# Patient Record
Sex: Female | Born: 1945 | Race: Black or African American | Hispanic: No | Smoking: Former smoker
Health system: Southern US, Community
[De-identification: ages and names within clinical notes are randomized; demographics above are authoritative.]

## PROBLEM LIST (undated history)

## (undated) DIAGNOSIS — I890 Lymphedema, not elsewhere classified: Secondary | ICD-10-CM

## (undated) DIAGNOSIS — Z860100 Personal history of colon polyps, unspecified: Secondary | ICD-10-CM

## (undated) DIAGNOSIS — L8990 Pressure ulcer of unspecified site, unspecified stage: Secondary | ICD-10-CM

## (undated) DIAGNOSIS — M4800 Spinal stenosis, site unspecified: Secondary | ICD-10-CM

## (undated) DIAGNOSIS — E785 Hyperlipidemia, unspecified: Secondary | ICD-10-CM

## (undated) DIAGNOSIS — K589 Irritable bowel syndrome without diarrhea: Secondary | ICD-10-CM

## (undated) HISTORY — DX: Spinal stenosis, site unspecified: M48.00

## (undated) HISTORY — DX: Personal history of colon polyps, unspecified: Z86.0100

## (undated) HISTORY — PX: PARTIAL HYSTERECTOMY: SHX80

## (undated) HISTORY — PX: OTHER SURGICAL HISTORY: SHX169

## (undated) HISTORY — DX: Hyperlipidemia, unspecified: E78.5

## (undated) HISTORY — PX: VESICOVAGINAL FISTULA CLOSURE W/ TAH: SUR271

## (undated) HISTORY — DX: Personal history of colonic polyps: Z86.010

## (undated) HISTORY — DX: Irritable bowel syndrome, unspecified: K58.9

---

## 2000-12-02 ENCOUNTER — Encounter: Payer: Self-pay | Admitting: Internal Medicine

## 2004-10-06 ENCOUNTER — Ambulatory Visit: Payer: Self-pay | Admitting: Internal Medicine

## 2005-02-02 ENCOUNTER — Ambulatory Visit: Payer: Self-pay | Admitting: Internal Medicine

## 2005-03-01 ENCOUNTER — Ambulatory Visit: Payer: Self-pay | Admitting: Internal Medicine

## 2005-03-22 ENCOUNTER — Ambulatory Visit: Payer: Self-pay | Admitting: Internal Medicine

## 2005-03-30 ENCOUNTER — Ambulatory Visit: Payer: Self-pay | Admitting: Internal Medicine

## 2005-06-08 ENCOUNTER — Ambulatory Visit: Payer: Self-pay | Admitting: Internal Medicine

## 2005-12-14 ENCOUNTER — Ambulatory Visit: Payer: Self-pay | Admitting: Internal Medicine

## 2006-06-14 ENCOUNTER — Ambulatory Visit: Payer: Self-pay | Admitting: Internal Medicine

## 2006-07-08 ENCOUNTER — Ambulatory Visit: Payer: Self-pay | Admitting: Internal Medicine

## 2006-07-12 ENCOUNTER — Encounter: Payer: Self-pay | Admitting: Internal Medicine

## 2006-08-23 ENCOUNTER — Ambulatory Visit: Payer: Self-pay | Admitting: Internal Medicine

## 2006-12-13 ENCOUNTER — Ambulatory Visit: Payer: Self-pay | Admitting: Internal Medicine

## 2007-06-10 DIAGNOSIS — G589 Mononeuropathy, unspecified: Secondary | ICD-10-CM | POA: Insufficient documentation

## 2007-06-10 DIAGNOSIS — E785 Hyperlipidemia, unspecified: Secondary | ICD-10-CM

## 2007-06-10 DIAGNOSIS — M479 Spondylosis, unspecified: Secondary | ICD-10-CM | POA: Insufficient documentation

## 2007-06-10 DIAGNOSIS — M4800 Spinal stenosis, site unspecified: Secondary | ICD-10-CM

## 2007-06-10 DIAGNOSIS — I10 Essential (primary) hypertension: Secondary | ICD-10-CM | POA: Insufficient documentation

## 2007-06-10 DIAGNOSIS — K589 Irritable bowel syndrome without diarrhea: Secondary | ICD-10-CM

## 2007-06-13 ENCOUNTER — Ambulatory Visit: Payer: Self-pay | Admitting: Internal Medicine

## 2007-06-16 LAB — CONVERTED CEMR LAB
Albumin: 3.7 g/dL (ref 3.5–5.2)
Calcium: 9.2 mg/dL (ref 8.4–10.5)
Cholesterol: 233 mg/dL (ref 0–200)
Creatinine, Ser: 1.3 mg/dL — ABNORMAL HIGH (ref 0.4–1.2)
Direct LDL: 156.9 mg/dL
Glucose, Bld: 91 mg/dL (ref 70–99)
HDL: 53 mg/dL (ref >39.0)
Potassium: 4.1 meq/L (ref 3.5–5.1)
Triglycerides: 106 mg/dL (ref 0–149)

## 2007-11-10 ENCOUNTER — Telehealth (INDEPENDENT_AMBULATORY_CARE_PROVIDER_SITE_OTHER): Payer: Self-pay | Admitting: *Deleted

## 2007-12-12 ENCOUNTER — Ambulatory Visit: Payer: Self-pay | Admitting: Internal Medicine

## 2008-02-06 ENCOUNTER — Encounter: Payer: Self-pay | Admitting: Internal Medicine

## 2008-02-06 ENCOUNTER — Ambulatory Visit: Payer: Self-pay | Admitting: Internal Medicine

## 2008-02-09 ENCOUNTER — Encounter (INDEPENDENT_AMBULATORY_CARE_PROVIDER_SITE_OTHER): Payer: Self-pay | Admitting: *Deleted

## 2008-05-07 ENCOUNTER — Telehealth: Payer: Self-pay | Admitting: Internal Medicine

## 2008-06-07 ENCOUNTER — Telehealth: Payer: Self-pay | Admitting: Internal Medicine

## 2008-06-07 ENCOUNTER — Ambulatory Visit: Payer: Self-pay | Admitting: Internal Medicine

## 2008-06-07 DIAGNOSIS — R609 Edema, unspecified: Secondary | ICD-10-CM

## 2008-06-09 ENCOUNTER — Telehealth: Payer: Self-pay | Admitting: Internal Medicine

## 2008-06-09 ENCOUNTER — Encounter: Payer: Self-pay | Admitting: Internal Medicine

## 2008-06-14 ENCOUNTER — Telehealth (INDEPENDENT_AMBULATORY_CARE_PROVIDER_SITE_OTHER): Payer: Self-pay | Admitting: *Deleted

## 2008-06-18 ENCOUNTER — Encounter: Payer: Self-pay | Admitting: Internal Medicine

## 2008-06-29 ENCOUNTER — Encounter: Payer: Self-pay | Admitting: Internal Medicine

## 2008-07-01 ENCOUNTER — Ambulatory Visit: Payer: Self-pay | Admitting: Specialist

## 2008-07-08 ENCOUNTER — Encounter: Payer: Self-pay | Admitting: Internal Medicine

## 2008-07-23 ENCOUNTER — Encounter: Payer: Self-pay | Admitting: Internal Medicine

## 2008-07-28 ENCOUNTER — Telehealth: Payer: Self-pay | Admitting: Internal Medicine

## 2008-08-03 ENCOUNTER — Ambulatory Visit: Payer: Self-pay | Admitting: Internal Medicine

## 2008-08-04 ENCOUNTER — Ambulatory Visit: Payer: Self-pay | Admitting: Cardiovascular Disease

## 2008-08-09 ENCOUNTER — Telehealth: Payer: Self-pay | Admitting: Internal Medicine

## 2008-08-19 ENCOUNTER — Telehealth: Payer: Self-pay | Admitting: Internal Medicine

## 2008-08-24 ENCOUNTER — Ambulatory Visit: Payer: Self-pay | Admitting: Internal Medicine

## 2008-08-24 LAB — CONVERTED CEMR LAB
Albumin: 3.7 g/dL (ref 3.5–5.2)
BUN: 15 mg/dL (ref 6–23)
CO2: 32 meq/L (ref 19–32)
Calcium: 9 mg/dL (ref 8.4–10.5)
Creatinine, Ser: 1.2 mg/dL (ref 0.4–1.2)
GFR calc non Af Amer: 49 mL/min
Phosphorus: 3.4 mg/dL (ref 2.3–4.6)
Sodium: 141 meq/L (ref 135–145)

## 2008-08-27 ENCOUNTER — Ambulatory Visit: Payer: Self-pay | Admitting: Cardiology

## 2008-08-30 DIAGNOSIS — D4959 Neoplasm of unspecified behavior of other genitourinary organ: Secondary | ICD-10-CM | POA: Insufficient documentation

## 2008-09-02 ENCOUNTER — Encounter: Payer: Self-pay | Admitting: Internal Medicine

## 2008-09-07 ENCOUNTER — Ambulatory Visit: Payer: Self-pay | Admitting: Internal Medicine

## 2008-09-23 ENCOUNTER — Ambulatory Visit: Payer: Self-pay | Admitting: Urology

## 2008-10-06 ENCOUNTER — Ambulatory Visit: Payer: Self-pay | Admitting: Urology

## 2008-10-06 ENCOUNTER — Encounter: Payer: Self-pay | Admitting: Internal Medicine

## 2008-10-12 ENCOUNTER — Encounter: Payer: Self-pay | Admitting: Internal Medicine

## 2008-10-12 ENCOUNTER — Ambulatory Visit: Payer: Self-pay | Admitting: Urology

## 2008-10-18 ENCOUNTER — Telehealth (INDEPENDENT_AMBULATORY_CARE_PROVIDER_SITE_OTHER): Payer: Self-pay | Admitting: *Deleted

## 2008-10-28 ENCOUNTER — Telehealth: Payer: Self-pay | Admitting: Internal Medicine

## 2008-11-18 ENCOUNTER — Telehealth: Payer: Self-pay | Admitting: Internal Medicine

## 2009-06-17 ENCOUNTER — Ambulatory Visit: Payer: Self-pay | Admitting: Internal Medicine

## 2009-06-17 DIAGNOSIS — M25579 Pain in unspecified ankle and joints of unspecified foot: Secondary | ICD-10-CM | POA: Insufficient documentation

## 2009-06-20 LAB — CONVERTED CEMR LAB
Alkaline Phosphatase: 76 units/L (ref 39–117)
Basophils Absolute: 0 10*3/uL (ref 0.0–0.1)
Bilirubin, Direct: 0.2 mg/dL (ref 0.0–0.3)
CO2: 30 meq/L (ref 19–32)
Calcium: 9.5 mg/dL (ref 8.4–10.5)
Eosinophils Relative: 2 % (ref 0.0–5.0)
HCT: 38.2 % (ref 36.0–46.0)
Lymphs Abs: 1.1 10*3/uL (ref 0.7–4.0)
MCHC: 33.8 g/dL (ref 30.0–36.0)
MCV: 93.9 fL (ref 78.0–100.0)
Monocytes Relative: 9.6 % (ref 3.0–12.0)
Neutro Abs: 3 10*3/uL (ref 1.4–7.7)
Neutrophils Relative %: 63.4 % (ref 43.0–77.0)
Platelets: 171 10*3/uL (ref 150.0–400.0)
RBC: 4.07 M/uL (ref 3.87–5.11)
TSH: 1.03 microintl units/mL (ref 0.35–5.50)
Total Bilirubin: 1 mg/dL (ref 0.3–1.2)
Triglycerides: 96 mg/dL (ref 0.0–149.0)
Uric Acid, Serum: 6.7 mg/dL (ref 2.4–7.0)
VLDL: 19.2 mg/dL (ref 0.0–40.0)
WBC: 4.7 10*3/uL (ref 4.5–10.5)

## 2009-07-05 ENCOUNTER — Telehealth: Payer: Self-pay | Admitting: Internal Medicine

## 2011-04-03 NOTE — Letter (Signed)
August 04, 2008    [name], MD  [address] [place], [place] [address]   RE:  Kayla Long, Kayla Long  MRN:  [id number]  /  DOB:  [birth year]   Dear Dr. Alphonsus Sias:   I had the pleasure today of seeing Kayla Long in my cardiology  office here in [place].  As you recall, she is a very pleasant  morbidly obese 65 year old [place] American female with a past medical  history significant for hypertension, hyperlipidemia, irritable bowel  syndrome, spinal stenosis, and colon polyps who has had complaints over  the last 4-5 months of a very swollen right leg with pain over the knee,  calf, and foot in the right leg.  She has also noted skin discoloration  changes over the right lower extremity.  She has no complaints of  swelling or pain in the left lower extremity.  She has been seen by  multiple specialists in the last several months including one of the  vascular specialists at [facility].  As you know, lower  extremity venous studies revealed no evidence of DVT and no superficial  thrombophlebitis.  There was also no evidence of venous reflux in the  lower extremities.  Ankle brachial indices were normal in the right and  left lower extremity.  This patient has also been seen by a  neurosurgical specialist and an orthopedic surgery specialist for the  pain in her leg.  The neurosurgical consultation felt that the stenosis  at L4-L5 could be one of the reasons that she is having pain in her  right lower extremity.  This does not, however, explain the extreme  unilateral lower extremity swelling.   The patient tells me today that she has had no chest discomfort,  shortness of breath, palpitations, dizziness, near-syncope, syncope,  orthopnea, or paroxysmal nocturnal dyspnea.  Her only complaint fact has  been the very painful swollen right lower extremity.   PAST MEDICAL HISTORY:  1. Hyperlipidemia.  2. Hypertension.  3. Irritable bowel  syndrome.  4. Morbid obesity.  5. Spinal stenosis.  6. Colon polyps.  7. Swollen right lower extremity of unknown etiology.   PAST SURGICAL HISTORY:  Hysterectomy and 2 vaginal deliveries.   ALLERGIES:  No known drug allergies.   MEDICATIONS:  1. Vicodin 5/500 mg three times daily.  2. Lexapro 20 mg half-a-tab daily.  3. Triamterene/hydrochlorothiazide 37.5/25 mg once daily.  4. Furosemide 80 mg once daily.   SOCIAL HISTORY:  The patient is divorced and has 2 adult children.  She  admits to the remote use of tobacco but none recently.  She denies the  use of alcohol or illicit drugs.   FAMILY HISTORY:  Noncontributory.   REVIEW OF SYSTEMS:  As stated in the history of present illness is  otherwise negative.   PHYSICAL EXAMINATION:  GENERAL:  She is a pleasant, morbidly obese  African American female in no acute distress sitting in a wheelchair  during the examination.  VITAL SIGNS:  Blood pressure 120/70, pulse 68 and regular, respirations  12 and nonlabored.  NECK:  No JVD visualized.  No carotid bruits.  No lymphadenopathy.  No  thyromegaly.  SKIN:  Warm and dry.  HEENT:  Oropharynx, mucous membranes are moist.  LUNGS:  Clear to auscultation bilaterally without wheezes, rhonchi, or  crackles noted.  CARDIOVASCULAR:  Regular rate and rhythm without murmurs, gallops, or  rubs noted.  ABDOMEN:  Obese.  Bowel sounds present.  EXTREMITIES:  The right lower extremity has 2+ edema extending from the  foot up to the thigh.  There are skin changes that are consistent with  stasis dermatitis.  I do not appreciate any focal areas of erythema.  It  is difficult to assess the patient's pulses secondary to her degree of  edema.  Her femoral pulses are very difficult to palpate secondary to  her body habitus.  Her left leg has trace edema.  Both of her feet are  warm and there are no open ulcerations on my examination.  I also do not  appreciate any significant varicose veins.  The  patient's sensation is  intact over her right and left foot.   DIAGNOSTIC STUDIES:  1. Doppler ultrasound of the right lower extremity performed on June 07, 2008 at [facility] is negative for DVT.      However, there is notation on this study that this is an incomplete      exam because of the patient's body habitus and lower extremity      edema.  The examination was difficult and the vessels could not all      be visualize with compression.  2. A 12-lead electrocardiogram obtained in our office today shows      normal sinus rhythm with a ventricular rate of 68 beats per minute      and no other abnormalities.  3. MRI of the lumbar spine without contrast performed on July 01, 2008 at [facility] shows multilevel degenerative disk      disease which is most severe at the L4/L5 level.  There is severe      thecal sac stenosis and bilateral exiting nerve root compression.      There is also paracentral disk protrusion at L5/S1 with      encroachment upon the central exiting nerve root with possible mild      compression and compromise.   ASSESSMENT/PLAN:  This is a very pleasant 65 year old African American  female with a history of hypertension, hyperlipidemia, and morbid  obesity who presents today with unilateral lower extremity swelling of  unknown etiology.  The patient has undergone multiple tests for this  problem and has seen multiple specialists.  I do not think that the  unilateral lower extremity swelling is related to any cardiac problems.  The patient's physical examination is not suggestive of any cardiac  abnormalities.  She also has no symptoms to suggest angina, arrhythmias,  or congestive heart failure.  Her EKG in our office today is normal.  I  do think that her unilateral lower extremity swelling is very concerning  for a deep venous thrombosis.  This has been considered multiple times  in the past and I am concerned that the  venous Doppler studies were  inadequate secondary to the patient's size.  The report I have today  does state that this was not a complete study secondary to her size.  The differential diagnosis for her unilateral lower extremity swelling  also includes a ruptured Baker's cyst behind the knee; however, my  physical examination today does not suggest this.  I do not think that  the patient has an active compartment syndrome as this has been going on  for some time.  I am concerned that it could possibly be some  obstructive mass preventing the venous return of blood from her right  leg.  I think the most  appropriate next test in her workup should be a  CT of her abdomen and pelvis to rule out tumor or a lymphoma.  Another  condition that can cause unilateral lower extremity swelling is May-  Thurner syndrome.  As you know this is a compression of the iliac vein  by the iliac artery preventing venous return.  This is typically found  in left lower extremity swelling.  The differential also includes  chronic lymphedema.   I would recommend proceeding with CT scan of abdomen and pelvis to rule  out an obstructive lesion preventing venous return.  You could consider  treating her as a DVT in the meantime while there is no definite  diagnosis.  I am still concerned that there could still be some DVT that  cannot be isolated on the Doppler examination because of the patient's  size.  I think that the  patient probably could benefit from seeing a dedicated venous vascular  specialist at one of the local [facility].  I do appreciate  this very interesting consult and will be glad to assist in any way  possible.  I have instructed the patient that we will order no further  cardiac workup at this time.    Sincerely,      Verne Carrow, MD  Electronically Signed    CM/MedQ  DD: 08/04/2008  DT: 08/05/2008  Job #: [id number]

## 2011-05-09 ENCOUNTER — Encounter: Payer: Self-pay | Admitting: Cardiovascular Disease

## 2012-04-18 ENCOUNTER — Encounter: Payer: Self-pay | Admitting: Internal Medicine

## 2015-05-11 ENCOUNTER — Ambulatory Visit: Payer: Medicare Other | Attending: Internal Medicine | Admitting: Occupational Therapy

## 2015-05-11 VITALS — Ht 67.0 in | Wt 315.0 lb

## 2015-05-11 DIAGNOSIS — I890 Lymphedema, not elsewhere classified: Secondary | ICD-10-CM | POA: Insufficient documentation

## 2015-05-11 DIAGNOSIS — E669 Obesity, unspecified: Secondary | ICD-10-CM | POA: Diagnosis not present

## 2015-05-11 NOTE — Patient Instructions (Signed)
1. Call Doctor immediately to report signs / symptoms of cellulitis. 2. Elevate legs to level of hear when seated. 3. Bring all existing compression garmens , devices and wraps to next visit.

## 2015-05-11 NOTE — Therapy (Signed)
[place] MAIN [facility] SERVICES [address] [place], [place], [address] Phone: [phone]   Fax:  [phone]  Occupational Therapy Evaluation  Patient Details  Name: Kayla Long MRN: [id number] Date of Birth: January 08, 1946 Referring Provider:  Hedy Jacob  Encounter Date: 05/11/2015      OT End of Session - 05/11/15 1706    Visit Number 1   Number of Visits 36   Date for OT Re-Evaluation 08/09/15   OT Start Time 1300   OT Stop Time 1422   OT Time Calculation (min) 82 min   Activity Tolerance Patient tolerated treatment well   Behavior During Therapy Flat affect      Past Medical History  Diagnosis Date  . Hx of colonic polyps     tubulovillous adenoma  . Hyperlipidemia   . IBS (irritable bowel syndrome)   . Spinal stenosis     with secondary neuropathy    Past Surgical History  Procedure Laterality Date  . Vesicovaginal fistula closure w/ tah    . 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 Pt reports leg swelling statrted without known precipitating events steadily worsened over the years. Eventually body assymetry led to loss of balance and associated falls which led to loss of employment. Limb swelling and body habitus severly limits patients ability to perform basic and instrumental  ADL and limits functional  mobility and ambulation,  Pt t reports she was seen here at [facility] in the past by Garlon Hatchet , OT, with some success. Pt states she understands lymphedema care program and is eager to get started. She tells me she has worn compression garments and  a night time device in the past, but existing garments/ devices no longer fit.   Patient is accompained by: Family member   Pertinent History BLE Lmphedema, R>L, BLE weakness, lumbar pain, CVI, Obesity, OA, HTN, Hx Depression, Partial hysterectomy 1997, former smoker, uses RW at single story home, has tub transfer bench. Pt's adult daughters assist most days a week. Pt has "room mate"   Limitations BLE weakness, chronic back pain, difficulty walking, difficulty fitting LB clothing and street shoes, Unable to reach feet for LB bathing and skin care, severly limited standing tolerance, falls risk, difficulty w/ all transfers and bed mobility, depression 2/2 limited social, family and community participation 2/2 difficulty walking   Patient Stated Goals "get my legs better so I can go to the grocery store and be able to sit outside with my grandchildren"   Currently in Pain? Yes   Pain Score 8    Pain Location Heel   Pain Orientation Right   Pain Descriptors / Indicators Aching;Burning;Constant   Pain Type Chronic pain   Pain Onset More than a month ago   Pain Frequency Constant   Aggravating Factors  standing , walking, dependent positioning   Pain Relieving Factors elevation, compression   Effect of Pain on Daily Activities limits functional mobility, ambulatoion, and B/I ADLs, as well as social and community participaton   Multiple  Pain Sites Yes   Pain Score 0           [facility] OT Assessment - 05/11/15 0001    Assessment   Diagnosis Moderate, stage 3 RLE lymphedema, other and mild stage 3 LLE LE, 2/2 obesity, CVI    Onset Date 05/11/15   Prior Therapy yes, Intensive Phase Complete Decongestive Therapy (CDT) at [facility]   Precautions   Precautions --  LE precautions- hx cellulitis, Hx pneumonia   ADL   Lower Body Bathing Maximal assistance   Lower Body Dressing Maximal assistance   Tub/Shower Transfer Maximal assistance   Tub/Shower Transfer Equipment Transfer tub bench    Transfers/Ambulation Related to ADL's Max A x 1 for car transfers   IADL   Shopping Completely unable to shop   [employer] Does not participate in any housekeeping tasks   Meal Prep Needs to have meals prepared and served   [employer] on family or friends for transportation   Mobility   Mobility Status History of falls;Needs assist          LYMPHEDEMA/ONCOLOGY QUESTIONNAIRE - 05/11/15 7846    What other symptoms do you have   Are you Having Heaviness or Tightness Yes   Are you having pitting edema No   Is it Hard or Difficult finding clothes that fit Yes   Do you have infections Yes   Comments  Hx hospitalization for cellulitis   Stemmer Sign Yes   Lymphedema Stage   Stage STAGE 3 ELEPHANTIASIS   Lymphedema Assessments   Lymphedema Assessments Lower extremities   Right Lower Extremity Lymphedema   Other 25,435.89 ml3   Other limb volume differential + 26.81%, R>L   Left Lower Extremity Lymphedema   Other 18616.59 ml3                       OT Education - 05/11/15 1704    Education provided Yes   Education Details Lymphedema etiology, precautions, progression patterns; Complete Decongestive Therepay, (CDT) treatment proticols including Intensive and management phases; importance of ongoing daily LE self care to sustain clinical and functional gains   Person(s) Educated Patient;Child(ren)   Methods Explanation;Demonstration;Tactile cues;Verbal cues;Handout   Comprehension Verbalized understanding;Need further instruction;Other (comment)             OT Long Term Goals - 05/11/15 1717    OT LONG TERM GOAL #1   Title Caregivers independent with proper gradient application techniques by end of week 2 to ensure optimal volumetric reductions during Intensive Phase CDT   Baseline Dependent   Time 2   Period Weeks   Status New   OT LONG TERM GOAL #2   Title Pt t o tolerate thigh length compression wraps to RLE by end of week 2 to  ensure uptimale volumetric reduction throughout Intensive CDT.   Baseline Dependent   Time 2   Period Weeks   Status New   OT LONG TERM GOAL #3   Title Pt to achieve >/= 20% RLE limb volume reduction by end of intensive to improve functional  ADLs performance, ambulation and transfers, and to limit progression of chronic LE.   Baseline Dependent   Time 12   Period Weeks   OT LONG TERM GOAL #4   Title Caregivers independent with simple self MLD by end of intensive to ensure optimal retention of clinical gains and limit LE progression.   Baseline Dependent   Time 12   Period Weeks   Status  New   OT LONG TERM GOAL #5   Title Pt to remain infection free to limit LE progression   Baseline Dependent   Time 6   Period Months   Status On-going               Plan - 05/15/15 1708    Pt will benefit from skilled therapeutic intervention in order to improve on the following deficits (Retired) Decreased balance;Decreased knowledge of precautions;Decreased knowledge of use of DME;Decreased mobility;Decreased range of motion;Decreased skin integrity;Difficulty walking;Increased edema;Pain   Rehab Potential Fair   Clinical Impairments Affecting Rehab Potential chronic back and R heel pain, ( neuropathic in foot??) obesity, decreased LE strength and functional mobility, difficulty walking, unable to reach feet, longstanding and advanced, irreversible  LE w/ tissue changes including induration and severe fibrosis below knees,    OT Frequency 3x / week   OT Duration 12 weeks   OT Treatment/Interventions Self-care/ADL training;Compression bandaging;DME and/or AE instruction;Patient/family education;Other (comment);Therapeutic exercise;Manual Therapy;Manual lymph drainage;Therapeutic activities   Plan Stress importance of Ongoing impecable skin care at least 2 x daily w/ low pH Eucerin cream. Initial visits w emphasis on caregiver edu for proper compression wrapping techniques. Commence CDT to RLE  first 2/2 falls risk.    OT Home Exercise Plan 2/x daily BLE lymphatic pumping therex- 10 reps each in sequence   Recommended Other Services garments and HOS devices. Most likey ccl 3 flat knit Jobst Elvarex knee highs and Maceo Pro AD for [employer] and Agree with Plan of Care Patient;Family member/caregiver   Family Member Consulted daughters           G-Codes - 05/15/2015 1724    Functional Assessment Tool Used clinical oservation/ judgement, interview, physical palpation, interview, comparative limb vloumetrics   Functional Limitation Self care   Self Care Current Status [phone]) At least 80 percent but less than 100 percent impaired, limited or restricted   Self Care Goal Status ([id number]) At least 60 percent but less than 80 percent impaired, limited or restricted      Problem List Patient Active Problem List   Diagnosis Date Noted  . ANKLE PAIN, LEFT 06/17/2009  . OBESITY-MORBID (>100') 04/25/2009  . NEOPLASM UNSPEC NATURE OTH GENITOURINARY ORGANS 08/30/2008  . LEG EDEMA 06/07/2008  . HYPERLIPIDEMIA 06/10/2007  . NEUROPATHY 06/10/2007  . HYPERTENSION 06/10/2007  . IBS 06/10/2007  . ARTHRITIS, SPINE 06/10/2007  . SPINAL STENOSIS 06/10/2007    Andrey Spearman, MS, OTR/L, CLT-LANA 15-May-2015 5:32 PM   May 15, 2015, 5:32 PM  [place] MAIN [facility] SERVICES [address] [place], [place], [address] Phone: [phone]   Fax:  [phone]

## 2015-07-11 ENCOUNTER — Ambulatory Visit: Payer: Medicare Other | Admitting: Occupational Therapy

## 2015-07-13 ENCOUNTER — Ambulatory Visit: Payer: Medicare Other | Attending: Internal Medicine | Admitting: Occupational Therapy

## 2015-07-13 DIAGNOSIS — I890 Lymphedema, not elsewhere classified: Secondary | ICD-10-CM | POA: Diagnosis not present

## 2015-07-13 NOTE — Patient Instructions (Signed)

## 2015-07-13 NOTE — Therapy (Signed)
[place] MAIN [facility] SERVICES [address] [place], [place], [address] Phone: [phone]   Fax:  [phone]  Occupational Therapy Treatment  Patient Details  Name: Kayla Long MRN: [id number] Date of Birth: [birth year] Referring Provider:  Hedy Jacob  Encounter Date: 07/13/2015      OT End of Session - 07/13/15 1712    Visit Number 2   Number of Visits 36   Date for OT Re-Evaluation 08/09/15   OT Start Time 1535   OT Stop Time 1640   OT Time Calculation (min) 65 min   Equipment Utilized During Treatment compression wraps and supplies   Activity Tolerance Patient tolerated treatment well;Other (comment)   Behavior During Therapy Flat affect      Past Medical History  Diagnosis Date  . Hx of colonic polyps     tubulovillous adenoma  . Hyperlipidemia   . IBS (irritable bowel syndrome)   . Spinal stenosis     with secondary neuropathy    Past Surgical History  Procedure Laterality Date  . Vesicovaginal fistula closure w/ tah    . Vaginal deliveries      x2    There were no vitals filed for this visit.  Visit Diagnosis:  Lymphedema      Subjective Assessment - 07/13/15 1703    Subjective  Pt presents with moderate-sever, Stage III, (Elephantiasis) RLE lymphedema (LE) and mild-moderate stage III LLE LE 2/2 CVI and obesity with ideopathic onset in late 1990's. Pt is accompanied on first Rx visit by her daughter [name]. Pt arrived in a wheelchair and required Mod A x 1 to stand pivot to treatment table <-> transport chair.  Finctional bed mobility is severly limited.   Patient is accompained by: Family member   Pertinent History BLE Lmphedema, R>L, BLE weakness, lumbar pain, CVI, Obesity, OA, HTN, Hx Depression, Partial hysterectomy 1997, former smoker, uses RW at single story home, has tub transfer bench. Pt's adult daughters assist most days a week. Pt has "room mate"   Limitations BLE weakness, chronic back pain,  difficulty walking, difficulty fitting LB clothing and street shoes, Unable to reach feet for LB bathing and skin care, severly limited standing tolerance, falls risk, difficulty w/ all transfers and bed mobility, depression 2/2 limited social, family and community participation 2/2 difficulty walking   Patient Stated Goals "get my legs better so I can go to the grocery store and be able to sit outside with my grandchildren"   Currently in Pain? No/denies   Pain Onset More than a month ago                      OT Treatments/Exercises (OP) - 07/13/15 0001    ADLs   ADL Comments Se Pt EDU   ADL Education Given Yes   Manual Therapy   Manual Therapy Edema management;Compression Bandaging   Manual therapy comments skin care   Edema Management edema management severly limited by decreased functional mobility   Compression Bandaging LLE gradient compression wraps applied circumferentially as follows: toe wrap x1 under cotton stockinett; 8 cm x 1 to foot and ankle, + 10 cm x 2, , all layered in gradient over .04 x 10 cm and 12 cm Rosidol Soft foam from A-D.                OT Education - 07/13/15 1709    Education provided Yes   Education Details Provided skilled Pt/ caregiver edu and  ADL training throughout visit for all components of LE self care, including compression wrapping, garment considerations, lymphatic pumping ther ex, simple self-MLD, and shin care.By end of visit caregiver verbalized recollection of wrap techniques and verbalized understanding. Will allow her to practive next couple of sessions before commencing MLD.   Person(s) Educated Patient;Child(ren)   Methods Explanation;Demonstration;Tactile cues;Verbal cues;Handout   Comprehension Verbalized understanding;Verbal cues required;Tactile cues required;Need further instruction             OT Long Term Goals - 05/11/15 1717    OT LONG TERM GOAL #1   Title Caregivers independent with proper gradient  application techniques by end of week 2 to ensure optimal volumetric reductions during Intensive Phase CDT   Baseline Dependent   Time 2   Period Weeks   Status New   OT LONG TERM GOAL #2   Title Pt t o tolerate thigh length compression wraps to RLE by end of week 2 to ensure uptimale volumetric reduction throughout Intensive CDT.   Baseline Dependent   Time 2   Period Weeks   Status New   OT LONG TERM GOAL #3   Title Pt to achieve >/= 20% RLE limb volume reduction by end of intensive to improve functional  ADLs performance, ambulation and transfers, and to limit progression of chronic LE.   Baseline Dependent   Time 12   Period Weeks   OT LONG TERM GOAL #4   Title Caregivers independent with simple self MLD by end of intensive to ensure optimal retention of clinical gains and limit LE progression.   Baseline Dependent   Time 12   Period Weeks   Status New   OT LONG TERM GOAL #5   Title Pt to remain infection free to limit LE progression   Baseline Dependent   Time 6   Period Months   Status On-going               Plan - 07/13/15 1713    Clinical Impression Statement Pt tolerated day 1 CDT without difficulty. Greatest limitation to participation is severly limited functional mobility and transfer problems. Pt unable to independently bend knee to reposition during wrapping. Pt unable to slide legs along floor to reposition in wc once  transfered with mod A x 1. Cont as per POC.   Pt will benefit from skilled therapeutic intervention in order to improve on the following deficits (Retired) Decreased activity tolerance;Decreased balance;Abnormal gait;Decreased endurance;Decreased knowledge of precautions;Decreased knowledge of use of DME;Decreased mobility;Decreased range of motion;Decreased skin integrity;Decreased strength;Difficulty walking;Impaired flexibility;Improper body mechanics;Increased edema;Obesity;Pain   OT Frequency 3x / week   OT Duration 12 weeks   OT  Treatment/Interventions Self-care/ADL training;Therapeutic exercise;Functional Mobility Training;Patient/family education;Manual Therapy;Manual lymph drainage;DME and/or AE instruction;Compression bandaging;Therapeutic activities   Consulted and Agree with Plan of Care Patient;Family member/caregiver   Family Member Consulted daughter [name]        Problem List Patient Active Problem List   Diagnosis Date Noted  . ANKLE PAIN, LEFT 06/17/2009  . OBESITY-MORBID (>100') 04/25/2009  . NEOPLASM UNSPEC NATURE OTH GENITOURINARY ORGANS 08/30/2008  . LEG EDEMA 06/07/2008  . HYPERLIPIDEMIA 06/10/2007  . NEUROPATHY 06/10/2007  . HYPERTENSION 06/10/2007  . IBS 06/10/2007  . ARTHRITIS, SPINE 06/10/2007  . SPINAL STENOSIS 06/10/2007    Andrey Spearman, MS, OTR/L, [facility] 07/13/2015 5:17 PM   [place] MAIN [facility] SERVICES [address] [place], [place], [address] Phone: [phone]   Fax:  [phone]

## 2015-07-15 ENCOUNTER — Ambulatory Visit: Payer: Medicare Other | Admitting: Occupational Therapy

## 2015-07-15 DIAGNOSIS — I890 Lymphedema, not elsewhere classified: Secondary | ICD-10-CM | POA: Diagnosis not present

## 2015-07-15 NOTE — Patient Instructions (Signed)

## 2015-07-15 NOTE — Therapy (Signed)
[place] MAIN [facility] SERVICES [address] [place], [place], [address] Phone: [phone]   Fax:  [phone]  Occupational Therapy Treatment  Patient Details  Name: Kayla Long MRN: [id number] Date of Birth: 01-21-1946 Referring Provider:  Hedy Jacob  Encounter Date: 07/15/2015      OT End of Session - 07/15/15 1642    Visit Number 3   Number of Visits 36   Date for OT Re-Evaluation 08/09/15   OT Start Time 1540   OT Stop Time 1635   OT Time Calculation (min) 55 min   Equipment Utilized During Treatment compression wraps and supplies   Activity Tolerance Patient tolerated treatment well;Other (comment);Patient limited by pain   Behavior During Therapy Flat affect;WFL for tasks assessed/performed      Past Medical History  Diagnosis Date  . Hx of colonic polyps     tubulovillous adenoma  . Hyperlipidemia   . IBS (irritable bowel syndrome)   . Spinal stenosis     with secondary neuropathy    Past Surgical History  Procedure Laterality Date  . Vesicovaginal fistula closure w/ tah    . Vaginal deliveries      x2    There were no vitals filed for this visit.  Visit Diagnosis:  Lymphedema      Subjective Assessment - 07/15/15 1637    Subjective  Pt presents for v Intensive CDT visit 3 to address moderate-sever, Stage III, (Elephantiasis) RLE lymphedema (LE) and mild-moderate stage III LLE LE 2/2 CVI and obesity. Pt  is accompanied by her daughters who will assist w/ LE self care between visit intervals.   Patient is accompained by: Family member   Pertinent History BLE Lmphedema, R>L, BLE weakness, lumbar pain, CVI, Obesity, OA, HTN, Hx Depression, Partial hysterectomy 1997, former smoker, uses RW at single story home, has tub transfer bench. Pt's adult daughters assist most days a week. Pt has "room mate"   Limitations BLE weakness, chronic back pain, difficulty walking, difficulty fitting LB clothing and street  shoes, Unable to reach feet for LB bathing and skin care, severly limited standing tolerance, falls risk, difficulty w/ all transfers and bed mobility, depression 2/2 limited social, family and community participation 2/2 difficulty walking   Patient Stated Goals "get my legs better so I can go to the grocery store and be able to sit outside with my grandchildren"   Currently in Pain? Yes   Pain Score 10-Worst pain ever   Pain Location Other (Comment)   Pain Onset More than a month ago                      OT Treatments/Exercises (OP) - 07/15/15 0001    ADLs   ADL Comments See Pt edu   ADL Education Given Yes   Exercises   Exercises --  lymphatic pumping ther ex BLE   Manual Therapy   Manual Therapy Edema management;Compression Bandaging   Manual therapy comments skin care   Edema Management edema management severly limited by decreased functional mobility   Compression Bandaging LLE gradient compression wraps applied circumferentially as follows: toe wrap x1 under cotton stockinett; 8 cm x 1 to foot and ankle, + 10 cm x 2, , all layered in gradient over .04 x 10 cm and 12 cm Rosidol Soft foam from A-D.                OT Education - 07/15/15 1640    Education  provided Yes   Education Details Provided ongoing skilled Pt/ caregiver edu and ADL training throughout visit for all components of LE self care, including compression wrapping, garment considerations, lymphatic pumping ther ex, simple self-MLD, and shin care.    Person(s) Educated Patient;Child(ren)   Methods Explanation;Demonstration;Tactile cues;Verbal cues;Handout   Comprehension Tactile cues required;Verbal cues required;Returned demonstration;Verbalized understanding;Need further instruction             OT Long Term Goals - 05/11/15 1717    OT LONG TERM GOAL #1   Title Caregivers independent with proper gradient application techniques by end of week 2 to ensure optimal volumetric reductions  during Intensive Phase CDT   Baseline Dependent   Time 2   Period Weeks   Status New   OT LONG TERM GOAL #2   Title Pt t o tolerate thigh length compression wraps to RLE by end of week 2 to ensure uptimale volumetric reduction throughout Intensive CDT.   Baseline Dependent   Time 2   Period Weeks   Status New   OT LONG TERM GOAL #3   Title Pt to achieve >/= 20% RLE limb volume reduction by end of intensive to improve functional  ADLs performance, ambulation and transfers, and to limit progression of chronic LE.   Baseline Dependent   Time 12   Period Weeks   OT LONG TERM GOAL #4   Title Caregivers independent with simple self MLD by end of intensive to ensure optimal retention of clinical gains and limit LE progression.   Baseline Dependent   Time 12   Period Weeks   Status New   OT LONG TERM GOAL #5   Title Pt to remain infection free to limit LE progression   Baseline Dependent   Time 6   Period Months   Status On-going               Plan - 07/15/15 1643    Clinical Impression Statement Emphasis of visit today on skilled Pt and caregiver edu for LE self care and ADLtraining w/ emphasis on caregiver compression wraping and ther ex by patient. Clarrified purpose of teaching wrapping to daughters so they can assist patient during visit intervals. OT explained that without didligent and consistent caregiver assistnce daily, prognosis for improvement in condition and retention of clinical gains is guardedf at best in this case . One daughter able to apply wrtaps using proper technique with max assist and VC by end of session. Pt able to perform lymphatic pumping ther ex in sequence with mod A and ongoing verbal and visual cues.   Pt will benefit from skilled therapeutic intervention in order to improve on the following deficits (Retired) Decreased activity tolerance;Decreased balance;Abnormal gait;Decreased endurance;Decreased knowledge of precautions;Decreased knowledge of use  of DME;Decreased mobility;Decreased range of motion;Decreased skin integrity;Decreased strength;Difficulty walking;Impaired flexibility;Improper body mechanics;Increased edema;Obesity;Pain   Rehab Potential Fair   OT Frequency 3x / week   OT Duration 12 weeks   OT Treatment/Interventions Self-care/ADL training;Therapeutic exercise;Functional Mobility Training;Patient/family education;Manual Therapy;Manual lymph drainage;DME and/or AE instruction;Compression bandaging;Therapeutic activities   Consulted and Agree with Plan of Care Patient;Family member/caregiver   Family Member Consulted daughter [name]        Problem List Patient Active Problem List   Diagnosis Date Noted  . ANKLE PAIN, LEFT 06/17/2009  . OBESITY-MORBID (>100') 04/25/2009  . NEOPLASM UNSPEC NATURE OTH GENITOURINARY ORGANS 08/30/2008  . LEG EDEMA 06/07/2008  . HYPERLIPIDEMIA 06/10/2007  . NEUROPATHY 06/10/2007  . HYPERTENSION 06/10/2007  . IBS  06/10/2007  . ARTHRITIS, SPINE 06/10/2007  . SPINAL STENOSIS 06/10/2007   Andrey Spearman, MS, OTR/L, CLT-LANA 07/15/2015 4:48 PM   Ansel Bong 07/15/2015, 4:48 PM  [place] MAIN [facility] SERVICES [address] [place], [place], [address] Phone: [phone]   Fax:  [phone]

## 2015-07-18 ENCOUNTER — Ambulatory Visit: Payer: Medicare Other | Admitting: Occupational Therapy

## 2015-07-20 ENCOUNTER — Ambulatory Visit: Payer: Medicare Other | Admitting: Occupational Therapy

## 2015-07-22 ENCOUNTER — Ambulatory Visit: Payer: Medicare Other | Admitting: Occupational Therapy

## 2015-07-26 ENCOUNTER — Ambulatory Visit: Payer: Medicare Other | Attending: Internal Medicine | Admitting: Occupational Therapy

## 2015-07-26 DIAGNOSIS — I890 Lymphedema, not elsewhere classified: Secondary | ICD-10-CM | POA: Diagnosis not present

## 2015-07-26 NOTE — Patient Instructions (Signed)
Lymphedema Precautions  If you experience atypical shortness of breath, or notice any signs /symptoms of skin infection (aka cellulitis) remove all compression wraps/ garments, discontinue manual lymphatic drainage (MLD), and report symptoms to your physician immediately. Discontinue MLD and compression for 72 hours after you take your first oral antibiotic so not to spread the infection.   Lymphedema Self- Care Instructions  1. EXERCISE: Perform lymphatic pumping there ex 2 x a day. While wearing your compression wraps or garments. Perform 10 reps of each exercise bilaterally and be sure to perform them in order. Don;t skip around!  OMIT PARTIAL SIT UP  2. MLD: Perform simple self-Manual Lymphatic Drainage (MLD) at least once a day as directed.  3. WRAPS: Compression wraps are to be worn 23 hrs/ 7 days/wk during Intensive Phase of Complete Decongestive Therapy (CDT).Building tolerance may take time and practice, so don't get discouraged. If bandages begin to feel tight during periods of inactivity and/or during the night, try performing your exercises to loosen them.  REMOVE ALL WRAPS IMMEDIATELY AND CALL YOUR PHYSICIAN IMMEDIATELY IF YOU EXPERIENCE ANY ATYPICAL SHORTNESS OF BREATH.  4. GARMENTS: During Management Phase CDT your compression garments are to be worn during waking hours when active. Do NOT sleep in your garments!!   5. PUT YOUR FEET UP! Elevate your feet and legs and feet to the level of your heart whenever you are sitting down.   6. SKIN: Carefully monitor skin condition and perform impeccable hygiene daily. Bathe skin with mild soap and water and apply low pH lotion (aka Eucerin ) to improve hydration and limit infection risk.    Lymphatic Pumping Exercises:

## 2015-07-26 NOTE — Therapy (Signed)
[place] MAIN [facility] SERVICES [address] [place], [place], [address] Phone: [phone]   Fax:  [phone]  Occupational Therapy Treatment  Patient Details  Name: Kayla Long MRN: [id number] Date of Birth: 1946-02-23 Referring Provider:  Hedy Jacob  Encounter Date: 07/26/2015      OT End of Session - 07/26/15 1749    Visit Number 4   Number of Visits 36   Date for OT Re-Evaluation 08/09/15   OT Start Time 1435   OT Stop Time 1600   OT Time Calculation (min) 85 min   Equipment Utilized During Treatment compression wraps and supplies   Activity Tolerance Patient tolerated treatment well;Other (comment);Patient limited by pain   Behavior During Therapy Flat affect;WFL for tasks assessed/performed      Past Medical History  Diagnosis Date  . Hx of colonic polyps     tubulovillous adenoma  . Hyperlipidemia   . IBS (irritable bowel syndrome)   . Spinal stenosis     with secondary neuropathy    Past Surgical History  Procedure Laterality Date  . Vesicovaginal fistula closure w/ tah    . Vaginal deliveries      x2    There were no vitals filed for this visit.  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 Pt  is accompanied by her daughters who will assist w/ LE self care between visit intervals. Pt c/o intermittent pain at R proximal lateral thigh  with palpation during MLD.throughout session. "It comes and goes. They have scanned it and checked it and thjey say nothing shows up."   Patient is accompained by: Family member   Pertinent History BLE Lmphedema, R>L, BLE weakness, lumbar pain, CVI, Obesity, OA, HTN, Hx Depression, Partial hysterectomy 1997, former smoker, uses RW at single story home, has tub  transfer bench. Pt's adult daughters assist most days a week. Pt has "room mate"   Limitations BLE weakness, chronic back pain, difficulty walking, difficulty fitting LB clothing and street shoes, Unable to reach feet for LB bathing and skin care, severly limited standing tolerance, falls risk, difficulty w/ all transfers and bed mobility, depression 2/2 limited social, family and community participation 2/2 difficulty walking   Patient Stated Goals "get my legs better so I can go to the grocery store and be able to sit outside with my grandchildren"   Pain Onset More than a month ago                      OT Treatments/Exercises (OP) - 07/26/15 0001    ADLs   ADL Comments See Pt EDU section   ADL Education Given Yes   Manual Therapy   Manual Therapy Edema management;Manual Lymphatic Drainage (MLD);Compression Bandaging   Manual therapy comments skin care   Edema Management edema management severly limited by decreased functional mobility   Compression Bandaging LLE gradient compression wraps applied circumferentially from toes to proximal  thigh as follows: toe wrap x1 under cotton stockinett; 8 cm x 2 to foot and ankle, + 10 cm x 2, 12 cm x 6 from below knee to groin- all layered in gradient over .04 x 10 cm and 12 cm Rosidol Soft foam from A-D.  OT Education - 07/26/15 1747    Education provided Yes   Education Details Provided skilled Pt/caregiver Education and ADL training throughout visit for lymphedema self care components, including compression wrapping, compression garment and device wear/care, lymphatic pumping. By end of session Pt able to perform J Stroke for simple self MLD with moderate assistance. ther ex, simple self-MLD, and skin care. Reviewed cellulitis precautions.    Person(s) Educated Patient;Child(ren)   Methods Explanation;Demonstration;Tactile cues;Verbal cues;Handout   Comprehension Verbalized understanding;Returned  demonstration;Verbal cues required;Tactile cues required;Need further instruction             OT Long Term Goals - 05/11/15 1717    OT LONG TERM GOAL #1   Title Caregivers independent with proper gradient application techniques by end of week 2 to ensure optimal volumetric reductions during Intensive Phase CDT   Baseline Dependent   Time 2   Period Weeks   Status New   OT LONG TERM GOAL #2   Title Pt t o tolerate thigh length compression wraps to RLE by end of week 2 to ensure uptimale volumetric reduction throughout Intensive CDT.   Baseline Dependent   Time 2   Period Weeks   Status New   OT LONG TERM GOAL #3   Title Pt to achieve >/= 20% RLE limb volume reduction by end of intensive to improve functional  ADLs performance, ambulation and transfers, and to limit progression of chronic LE.   Baseline Dependent   Time 12   Period Weeks   OT LONG TERM GOAL #4   Title Caregivers independent with simple self MLD by end of intensive to ensure optimal retention of clinical gains and limit LE progression.   Baseline Dependent   Time 12   Period Weeks   Status New   OT LONG TERM GOAL #5   Title Pt to remain infection free to limit LE progression   Baseline Dependent   Time 6   Period Months   Status On-going               Plan - 07/26/15 1750    Clinical Impression Statement Pt tolerated compression after last session without difficulty. Pt wrapped to proximal thigh today in effort to move congestion into dunctional groin nodes. Pt not fully engaged during session and is having very difficult time with transfers. Cont as per POC. Cont to work on [employer] tolerance.   Pt will benefit from skilled therapeutic intervention in order to improve on the following deficits (Retired) Decreased activity tolerance;Decreased balance;Abnormal gait;Decreased endurance;Decreased knowledge of precautions;Decreased knowledge of use of DME;Decreased mobility;Decreased range of  motion;Decreased skin integrity;Decreased strength;Difficulty walking;Impaired flexibility;Improper body mechanics;Increased edema;Obesity;Pain   Rehab Potential Fair   OT Frequency 3x / week   OT Duration 12 weeks   OT Treatment/Interventions Self-care/ADL training;Therapeutic exercise;Functional Mobility Training;Patient/family education;Manual Therapy;Manual lymph drainage;DME and/or AE instruction;Compression bandaging;Therapeutic activities   Consulted and Agree with Plan of Care Patient;Family member/caregiver   Family Member Consulted daughter [name]        Problem List Patient Active Problem List   Diagnosis Date Noted  . ANKLE PAIN, LEFT 06/17/2009  . OBESITY-MORBID (>100') 04/25/2009  . NEOPLASM UNSPEC NATURE OTH GENITOURINARY ORGANS 08/30/2008  . LEG EDEMA 06/07/2008  . HYPERLIPIDEMIA 06/10/2007  . NEUROPATHY 06/10/2007  . HYPERTENSION 06/10/2007  . IBS 06/10/2007  . ARTHRITIS, SPINE 06/10/2007  . SPINAL STENOSIS 06/10/2007   Andrey Spearman, MS, OTR/L, CLT-LANA 07/26/2015 5:53 PM  Ansel Bong 07/26/2015, 5:53 PM  [place]  [facility] MAIN [facility] SERVICES [place], [place], [address] Phone: [phone]   Fax:  [phone]

## 2015-07-27 ENCOUNTER — Ambulatory Visit: Payer: Medicare Other | Admitting: Occupational Therapy

## 2015-07-29 ENCOUNTER — Ambulatory Visit: Payer: Medicare Other | Admitting: Occupational Therapy

## 2015-07-29 ENCOUNTER — Encounter: Payer: Self-pay | Admitting: Occupational Therapy

## 2015-07-29 NOTE — Therapy (Signed)
[place] MAIN [facility] SERVICES [address] [place], [place], [address] Phone: [phone]   Fax:  [phone]  Occupational Therapy Treatment  Patient Details  Name: Kayla Long MRN: [id number] Date of Birth: December 29, 1945 Referring Provider:  No ref. provider found  Encounter Date: 07/29/2015    Past Medical History  Diagnosis Date  . Hx of colonic polyps     tubulovillous adenoma  . Hyperlipidemia   . IBS (irritable bowel syndrome)   . Spinal stenosis     with secondary neuropathy    Past Surgical History  Procedure Laterality Date  . Vesicovaginal fistula closure w/ tah    . Vaginal deliveries      x2    There were no vitals filed for this visit.  Visit Diagnosis:  No diagnosis found.      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 Pt  is accompanied by her daughters and grandchild. Pt declines MLD due to pain at lateral R thigh since last visit with MLD .   Patient is accompained by: Family member   Pertinent History BLE Lmphedema, R>L, BLE weakness, lumbar pain, CVI, Obesity, OA, HTN, Hx Depression, Partial hysterectomy 1997, former smoker, uses RW at single story home, has tub transfer bench. Pt's adult daughters assist most days a week. Pt has "room mate"   Limitations BLE weakness, chronic back pain, difficulty walking, difficulty fitting LB clothing and street shoes, Unable to reach feet for LB bathing and skin care, severly limited standing tolerance, falls risk, difficulty w/ all transfers and bed mobility, depression 2/2 limited social, family and community participation 2/2 difficulty walking   Patient Stated Goals "get my legs better so I can go to the grocery store and be able to sit outside with my grandchildren"   Pain Onset More than a  month ago                                   OT Long Term Goals - 05/11/15 1717    OT LONG TERM GOAL #1   Title Caregivers independent with proper gradient application techniques by end of week 2 to ensure optimal volumetric reductions during Intensive Phase CDT   Baseline Dependent   Time 2   Period Weeks   Status New   OT LONG TERM GOAL #2   Title Pt t o tolerate thigh length compression wraps to RLE by end of week 2 to ensure uptimale volumetric reduction throughout Intensive CDT.   Baseline Dependent   Time 2   Period Weeks   Status New   OT LONG TERM GOAL #3   Title Pt to achieve >/= 20% RLE limb volume reduction by end of intensive to improve functional  ADLs performance, ambulation and transfers, and to limit progression of chronic LE.   Baseline Dependent   Time 12   Period Weeks   OT LONG TERM GOAL #4   Title Caregivers independent with simple self MLD by end of intensive to ensure optimal retention of clinical gains and limit LE progression.   Baseline Dependent   Time 12   Period Weeks   Status New   OT LONG TERM GOAL #5   Title Pt to remain infection free to limit  LE progression   Baseline Dependent   Time 6   Period Months   Status On-going               Plan - 07/29/15 1558    Clinical Impression Statement Pt has attended 5 visits to date and progress has been slow. Pt unable to tolerate MLD today 2/2 pain at lateral thigh. Functional mobility is severly limited due to weakness and body habitus, making mobility, transfers, lymphatic pumping ther ex, and functional muscle pumping movments for fluid decongestion very difficult.  We had a very frank discussion today  about Pt's declining prognosis without participating in all CDT protocols, essential for optimal outcomes I suggested Pt participate in PT session in conjunction with OT in compression to assist w/ lower extremity lymphatic mobilization while in conmpression. Pt and family  were somewhat discouraged at the end of visit. They plan to contact Pt's new PCP and decide if CDT is practical for them at this time. Will call to F/U on plan if Pt misses next scheduled appointment..   Pt will benefit from skilled therapeutic intervention in order to improve on the following deficits (Retired) Difficulty walking;Impaired flexibility;Increased edema;Decreased skin integrity;Decreased knowledge of precautions;Decreased knowledge of use of DME;Decreased mobility   Rehab Potential Poor   OT Frequency 3x / week   OT Duration 12 weeks   OT Treatment/Interventions Self-care/ADL training;DME and/or AE instruction;Manual lymph drainage;Patient/family education;Compression bandaging;Therapeutic exercises;Manual Therapy   OT Home Exercise Plan 2/x daily BLE lymphatic pumping therex- 10 reps each in sequence        Problem List Patient Active Problem List   Diagnosis Date Noted  . ANKLE PAIN, LEFT 06/17/2009  . OBESITY-MORBID (>100') 04/25/2009  . NEOPLASM UNSPEC NATURE OTH GENITOURINARY ORGANS 08/30/2008  . LEG EDEMA 06/07/2008  . HYPERLIPIDEMIA 06/10/2007  . NEUROPATHY 06/10/2007  . HYPERTENSION 06/10/2007  . IBS 06/10/2007  . ARTHRITIS, SPINE 06/10/2007  . SPINAL STENOSIS 06/10/2007   Andrey Spearman, MS, OTR/L, CLT-LANA 07/29/2015 4:09 PM   Ansel Bong 07/29/2015, 4:09 PM  [place] MAIN [facility] SERVICES [address] [place], [place], [address] Phone: [phone]   Fax:  [phone]

## 2015-07-29 NOTE — Patient Instructions (Signed)

## 2015-08-01 ENCOUNTER — Ambulatory Visit: Payer: Medicare Other | Admitting: Occupational Therapy

## 2015-08-03 ENCOUNTER — Ambulatory Visit: Payer: Medicare Other | Admitting: Occupational Therapy

## 2015-08-05 ENCOUNTER — Ambulatory Visit: Payer: Medicare Other | Admitting: Occupational Therapy

## 2015-08-08 ENCOUNTER — Ambulatory Visit: Payer: Medicare Other | Admitting: Occupational Therapy

## 2015-08-10 ENCOUNTER — Ambulatory Visit: Payer: Medicare Other | Admitting: Occupational Therapy

## 2015-08-12 ENCOUNTER — Ambulatory Visit: Payer: Medicare Other | Admitting: Occupational Therapy

## 2015-08-15 ENCOUNTER — Ambulatory Visit: Payer: Medicare Other | Admitting: Occupational Therapy

## 2015-08-17 ENCOUNTER — Ambulatory Visit: Payer: Medicare Other | Admitting: Occupational Therapy

## 2015-08-19 ENCOUNTER — Ambulatory Visit: Payer: Medicare Other | Admitting: Occupational Therapy

## 2015-08-22 ENCOUNTER — Ambulatory Visit: Payer: Medicare Other | Admitting: Occupational Therapy

## 2015-08-24 ENCOUNTER — Ambulatory Visit: Payer: Medicare Other | Admitting: Occupational Therapy

## 2015-08-26 ENCOUNTER — Ambulatory Visit: Payer: Medicare Other | Admitting: Occupational Therapy

## 2015-08-29 ENCOUNTER — Ambulatory Visit: Payer: Medicare Other | Admitting: Occupational Therapy

## 2015-08-31 ENCOUNTER — Ambulatory Visit: Payer: Medicare Other | Admitting: Occupational Therapy

## 2015-09-02 ENCOUNTER — Ambulatory Visit: Payer: Medicare Other | Admitting: Occupational Therapy

## 2015-09-05 ENCOUNTER — Ambulatory Visit: Payer: Medicare Other | Admitting: Occupational Therapy

## 2015-09-07 ENCOUNTER — Ambulatory Visit: Payer: Medicare Other | Admitting: Occupational Therapy

## 2015-09-09 ENCOUNTER — Ambulatory Visit: Payer: Medicare Other | Admitting: Occupational Therapy

## 2015-09-12 ENCOUNTER — Ambulatory Visit: Payer: Medicare Other | Admitting: Occupational Therapy

## 2015-09-14 ENCOUNTER — Ambulatory Visit: Payer: Medicare Other | Admitting: Occupational Therapy

## 2015-09-16 ENCOUNTER — Ambulatory Visit: Payer: Medicare Other | Admitting: Occupational Therapy

## 2015-09-19 ENCOUNTER — Ambulatory Visit: Payer: Medicare Other | Admitting: Occupational Therapy

## 2015-09-21 ENCOUNTER — Ambulatory Visit: Payer: Medicare Other | Admitting: Occupational Therapy

## 2015-09-23 ENCOUNTER — Ambulatory Visit: Payer: Medicare Other | Admitting: Occupational Therapy

## 2015-09-26 ENCOUNTER — Ambulatory Visit: Payer: Medicare Other | Admitting: Occupational Therapy

## 2015-09-28 ENCOUNTER — Ambulatory Visit: Payer: Medicare Other | Admitting: Occupational Therapy

## 2015-09-30 ENCOUNTER — Ambulatory Visit: Payer: Medicare Other | Admitting: Occupational Therapy

## 2016-01-23 ENCOUNTER — Encounter: Payer: Self-pay | Admitting: Occupational Therapy

## 2016-01-23 NOTE — Therapy (Signed)
[place] MAIN [facility] SERVICES [address] [place], [place], [address] Phone: [phone]   Fax:  [phone]  January 23, 2016    _0 @  Occupational Therapy Discharge Summary   Patient: Kayla Long MRN: [id number] Date of Birth: 11/01/1946  Diagnosis: No diagnosis found.  No Data Recorded  The above patient had been seen in Occupational Therapy to address severe BLE lymphedema 2/2 suspected venous insufficiency, morbid obesity, and dependent positioning.   The treatment consisted of Intensive Phase Complete Decongestive Therapy and caregiver education.  The patient's condition was unchanged when last seen in 07/2015 due tolimited tolerance of standard CDT protocols and poor   Functional Status at [place] for LE self care, needs assistance w/ most basic and instrumental ADLs.  No Goals Met      Plan - 01/23/16 1406    Clinical Impression Statement Pt is discharged from Occupational Therapy for lymphedema care of BLE. Pt was last seen for treatment in 07/2015, and she has not returned for treatment. She has not called with questions or concerns.      Sincerely, Andrey Spearman, MS, OTR/L, CLT-LANA 01/23/2016 2:12 PM   CC _1 @  [place] MAIN [facility] SERVICES [address] [place], [place], [address] Phone: [phone]   Fax:  [phone]  Patient: Kayla Long MRN: [id number] Date of Birth: 07-23-1946

## 2016-06-08 ENCOUNTER — Other Ambulatory Visit
Admission: RE | Admit: 2016-06-08 | Discharge: 2016-06-08 | Disposition: A | Discharge status: Home / Self Care | Payer: Medicare Other | Source: Ambulatory Visit | Attending: Family Medicine | Admitting: Family Medicine

## 2016-06-08 DIAGNOSIS — I82402 Acute embolism and thrombosis of unspecified deep veins of left lower extremity: Secondary | ICD-10-CM | POA: Insufficient documentation

## 2016-06-08 LAB — PROTIME-INR
INR: 1.32
PROTHROMBIN TIME: 16.5 s — AB (ref 11.4–15.0)

## 2016-06-10 ENCOUNTER — Other Ambulatory Visit
Admission: RE | Admit: 2016-06-10 | Discharge: 2016-06-10 | Disposition: A | Discharge status: Home / Self Care | Payer: Medicare Other | Source: Ambulatory Visit | Attending: Sports Medicine | Admitting: Sports Medicine

## 2016-06-10 DIAGNOSIS — I824Y9 Acute embolism and thrombosis of unspecified deep veins of unspecified proximal lower extremity: Secondary | ICD-10-CM | POA: Insufficient documentation

## 2016-06-10 LAB — PROTIME-INR
INR: 1.3
PROTHROMBIN TIME: 16.3 s — AB (ref 11.4–15.0)

## 2021-06-30 ENCOUNTER — Inpatient Hospital Stay
Admission: EM | Admit: 2021-06-30 | Discharge: 2021-07-11 | DRG: 872 | Disposition: A | Discharge status: Home Health | Payer: Medicare HMO | Attending: Internal Medicine | Admitting: Internal Medicine

## 2021-06-30 ENCOUNTER — Encounter: Payer: Self-pay | Admitting: Internal Medicine

## 2021-06-30 ENCOUNTER — Emergency Department: Payer: Medicare HMO

## 2021-06-30 ENCOUNTER — Other Ambulatory Visit: Payer: Self-pay

## 2021-06-30 DIAGNOSIS — E785 Hyperlipidemia, unspecified: Secondary | ICD-10-CM | POA: Diagnosis present

## 2021-06-30 DIAGNOSIS — A4159 Other Gram-negative sepsis: Secondary | ICD-10-CM | POA: Diagnosis present

## 2021-06-30 DIAGNOSIS — L89150 Pressure ulcer of sacral region, unstageable: Secondary | ICD-10-CM | POA: Diagnosis present

## 2021-06-30 DIAGNOSIS — I959 Hypotension, unspecified: Secondary | ICD-10-CM | POA: Diagnosis present

## 2021-06-30 DIAGNOSIS — E538 Deficiency of other specified B group vitamins: Secondary | ICD-10-CM | POA: Diagnosis present

## 2021-06-30 DIAGNOSIS — Z515 Encounter for palliative care: Secondary | ICD-10-CM

## 2021-06-30 DIAGNOSIS — Z888 Allergy status to other drugs, medicaments and biological substances status: Secondary | ICD-10-CM

## 2021-06-30 DIAGNOSIS — N1831 Chronic kidney disease, stage 3a: Secondary | ICD-10-CM | POA: Diagnosis present

## 2021-06-30 DIAGNOSIS — K219 Gastro-esophageal reflux disease without esophagitis: Secondary | ICD-10-CM | POA: Diagnosis present

## 2021-06-30 DIAGNOSIS — Z7189 Other specified counseling: Secondary | ICD-10-CM | POA: Diagnosis not present

## 2021-06-30 DIAGNOSIS — R7881 Bacteremia: Secondary | ICD-10-CM | POA: Diagnosis not present

## 2021-06-30 DIAGNOSIS — I890 Lymphedema, not elsewhere classified: Secondary | ICD-10-CM

## 2021-06-30 DIAGNOSIS — F32A Depression, unspecified: Secondary | ICD-10-CM | POA: Diagnosis present

## 2021-06-30 DIAGNOSIS — Z6841 Body Mass Index (BMI) 40.0 and over, adult: Secondary | ICD-10-CM | POA: Diagnosis not present

## 2021-06-30 DIAGNOSIS — A419 Sepsis, unspecified organism: Secondary | ICD-10-CM | POA: Diagnosis not present

## 2021-06-30 DIAGNOSIS — Z8249 Family history of ischemic heart disease and other diseases of the circulatory system: Secondary | ICD-10-CM

## 2021-06-30 DIAGNOSIS — Z8679 Personal history of other diseases of the circulatory system: Secondary | ICD-10-CM | POA: Diagnosis not present

## 2021-06-30 DIAGNOSIS — K589 Irritable bowel syndrome without diarrhea: Secondary | ICD-10-CM | POA: Diagnosis present

## 2021-06-30 DIAGNOSIS — F419 Anxiety disorder, unspecified: Secondary | ICD-10-CM | POA: Diagnosis present

## 2021-06-30 DIAGNOSIS — R6520 Severe sepsis without septic shock: Secondary | ICD-10-CM | POA: Diagnosis present

## 2021-06-30 DIAGNOSIS — Z20822 Contact with and (suspected) exposure to covid-19: Secondary | ICD-10-CM | POA: Diagnosis present

## 2021-06-30 DIAGNOSIS — Z823 Family history of stroke: Secondary | ICD-10-CM | POA: Diagnosis not present

## 2021-06-30 DIAGNOSIS — D631 Anemia in chronic kidney disease: Secondary | ICD-10-CM | POA: Diagnosis present

## 2021-06-30 DIAGNOSIS — I129 Hypertensive chronic kidney disease with stage 1 through stage 4 chronic kidney disease, or unspecified chronic kidney disease: Secondary | ICD-10-CM | POA: Diagnosis present

## 2021-06-30 DIAGNOSIS — Z7401 Bed confinement status: Secondary | ICD-10-CM | POA: Diagnosis not present

## 2021-06-30 DIAGNOSIS — Z79899 Other long term (current) drug therapy: Secondary | ICD-10-CM | POA: Diagnosis not present

## 2021-06-30 DIAGNOSIS — L89320 Pressure ulcer of left buttock, unstageable: Secondary | ICD-10-CM | POA: Diagnosis present

## 2021-06-30 DIAGNOSIS — R4182 Altered mental status, unspecified: Secondary | ICD-10-CM | POA: Diagnosis present

## 2021-06-30 DIAGNOSIS — E559 Vitamin D deficiency, unspecified: Secondary | ICD-10-CM | POA: Diagnosis present

## 2021-06-30 DIAGNOSIS — L89309 Pressure ulcer of unspecified buttock, unspecified stage: Secondary | ICD-10-CM

## 2021-06-30 DIAGNOSIS — Z28311 Partially vaccinated for covid-19: Secondary | ICD-10-CM

## 2021-06-30 DIAGNOSIS — E876 Hypokalemia: Secondary | ICD-10-CM | POA: Diagnosis present

## 2021-06-30 DIAGNOSIS — N390 Urinary tract infection, site not specified: Secondary | ICD-10-CM | POA: Diagnosis present

## 2021-06-30 DIAGNOSIS — L89310 Pressure ulcer of right buttock, unstageable: Secondary | ICD-10-CM | POA: Diagnosis present

## 2021-06-30 DIAGNOSIS — D638 Anemia in other chronic diseases classified elsewhere: Secondary | ICD-10-CM | POA: Diagnosis not present

## 2021-06-30 DIAGNOSIS — Z87891 Personal history of nicotine dependence: Secondary | ICD-10-CM

## 2021-06-30 LAB — CBC WITH DIFFERENTIAL/PLATELET
Abs Immature Granulocytes: 0.56 10*3/uL — ABNORMAL HIGH (ref 0.00–0.07)
Basophils Absolute: 0.1 10*3/uL (ref 0.0–0.1)
Basophils Relative: 0 %
Eosinophils Absolute: 0.1 10*3/uL (ref 0.0–0.5)
Eosinophils Relative: 0 %
HCT: 24.6 % — ABNORMAL LOW (ref 36.0–46.0)
Hemoglobin: 7.8 g/dL — ABNORMAL LOW (ref 12.0–15.0)
Immature Granulocytes: 2 %
Lymphocytes Relative: 3 %
Lymphs Abs: 0.9 10*3/uL (ref 0.7–4.0)
MCH: 28.7 pg (ref 26.0–34.0)
MCHC: 31.7 g/dL (ref 30.0–36.0)
MCV: 90.4 fL (ref 80.0–100.0)
Monocytes Absolute: 1.1 10*3/uL — ABNORMAL HIGH (ref 0.1–1.0)
Monocytes Relative: 4 %
Neutro Abs: 24.3 10*3/uL — ABNORMAL HIGH (ref 1.7–7.7)
Neutrophils Relative %: 91 %
Platelets: 302 10*3/uL (ref 150–400)
RBC: 2.72 MIL/uL — ABNORMAL LOW (ref 3.87–5.11)
RDW: 16.9 % — ABNORMAL HIGH (ref 11.5–15.5)
Smear Review: INCREASED
WBC: 26.9 10*3/uL — ABNORMAL HIGH (ref 4.0–10.5)
nRBC: 0.1 % (ref 0.0–0.2)

## 2021-06-30 LAB — COMPREHENSIVE METABOLIC PANEL
ALT: 7 U/L (ref 0–44)
AST: 17 U/L (ref 15–41)
Albumin: 1.6 g/dL — ABNORMAL LOW (ref 3.5–5.0)
Alkaline Phosphatase: 132 U/L — ABNORMAL HIGH (ref 38–126)
Anion gap: 10 (ref 5–15)
BUN: 14 mg/dL (ref 8–23)
CO2: 24 mmol/L (ref 22–32)
Calcium: 7.3 mg/dL — ABNORMAL LOW (ref 8.9–10.3)
Chloride: 100 mmol/L (ref 98–111)
Creatinine, Ser: 1.1 mg/dL — ABNORMAL HIGH (ref 0.44–1.00)
GFR, Estimated: 53 mL/min — ABNORMAL LOW (ref >60)
Glucose, Bld: 100 mg/dL — ABNORMAL HIGH (ref 70–99)
Potassium: 2.6 mmol/L — CL (ref 3.5–5.1)
Sodium: 134 mmol/L — ABNORMAL LOW (ref 135–145)
Total Bilirubin: 0.8 mg/dL (ref 0.3–1.2)
Total Protein: 6.5 g/dL (ref 6.5–8.1)

## 2021-06-30 LAB — RESP PANEL BY RT-PCR (FLU A&B, COVID) ARPGX2
Influenza A by PCR: NEGATIVE
Influenza B by PCR: NEGATIVE
SARS Coronavirus 2 by RT PCR: NEGATIVE

## 2021-06-30 LAB — URINALYSIS, COMPLETE (UACMP) WITH MICROSCOPIC
Bilirubin Urine: NEGATIVE
Glucose, UA: NEGATIVE mg/dL
Ketones, ur: NEGATIVE mg/dL
Nitrite: NEGATIVE
Protein, ur: NEGATIVE mg/dL
RBC / HPF: >50 RBC/hpf — ABNORMAL HIGH (ref 0–5)
Specific Gravity, Urine: 1.006 (ref 1.005–1.030)
WBC, UA: >50 WBC/hpf — ABNORMAL HIGH (ref 0–5)
pH: 6 (ref 5.0–8.0)

## 2021-06-30 LAB — LACTIC ACID, PLASMA
Lactic Acid, Venous: 3.5 mmol/L (ref 0.5–1.9)
Lactic Acid, Venous: 2 mmol/L (ref 0.5–1.9)

## 2021-06-30 LAB — APTT: aPTT: 32 seconds (ref 24–36)

## 2021-06-30 LAB — MAGNESIUM: Magnesium: 1.9 mg/dL (ref 1.7–2.4)

## 2021-06-30 LAB — PROTIME-INR
INR: 1.3 — ABNORMAL HIGH (ref 0.8–1.2)
Prothrombin Time: 16.3 seconds — ABNORMAL HIGH (ref 11.4–15.2)

## 2021-06-30 LAB — PROCALCITONIN: Procalcitonin: 7.06 ng/mL

## 2021-06-30 MED ORDER — LACTATED RINGERS IV SOLN: INTRAVENOUS | Status: AC

## 2021-06-30 MED ORDER — SODIUM CHLORIDE 0.9 % IV SOLN
2.0000 g | INTRAVENOUS | Status: DC
  Administered 2021-06-30: 2 g via INTRAVENOUS
  Filled 2021-06-30: qty 20

## 2021-06-30 MED ORDER — PAROXETINE HCL 30 MG PO TABS
30.0000 mg | ORAL_TABLET | Freq: Every day | ORAL | Status: DC
  Administered 2021-07-01 – 2021-07-11 (×11): 30 mg via ORAL
  Filled 2021-06-30 (×13): qty 1

## 2021-06-30 MED ORDER — SODIUM CHLORIDE 0.9 % IV SOLN: 2.0000 g | INTRAVENOUS | Status: DC

## 2021-06-30 MED ORDER — LACTATED RINGERS IV SOLN: INTRAVENOUS | Status: DC

## 2021-06-30 MED ORDER — POTASSIUM CHLORIDE 10 MEQ/100ML IV SOLN
10.0000 meq | INTRAVENOUS | Status: AC
  Administered 2021-06-30 – 2021-07-01 (×4): 10 meq via INTRAVENOUS
  Filled 2021-06-30 (×4): qty 100

## 2021-06-30 MED ORDER — LACTATED RINGERS IV BOLUS (SEPSIS)
1000.0000 mL | Freq: Once | INTRAVENOUS | Status: AC
  Administered 2021-06-30: 1000 mL via INTRAVENOUS

## 2021-06-30 MED ORDER — POTASSIUM CHLORIDE CRYS ER 20 MEQ PO TBCR
40.0000 meq | EXTENDED_RELEASE_TABLET | Freq: Once | ORAL | Status: AC
  Administered 2021-06-30: 40 meq via ORAL
  Filled 2021-06-30: qty 2

## 2021-06-30 MED ORDER — MIDODRINE HCL 5 MG PO TABS
10.0000 mg | ORAL_TABLET | Freq: Three times a day (TID) | ORAL | Status: DC
  Administered 2021-06-30 – 2021-07-11 (×33): 10 mg via ORAL
  Filled 2021-06-30 (×33): qty 2

## 2021-06-30 MED ORDER — PANTOPRAZOLE SODIUM 40 MG PO TBEC
40.0000 mg | DELAYED_RELEASE_TABLET | Freq: Two times a day (BID) | ORAL | Status: DC
  Administered 2021-07-01 – 2021-07-06 (×11): 40 mg via ORAL
  Filled 2021-06-30 (×11): qty 1

## 2021-06-30 MED ORDER — ACETAMINOPHEN 650 MG RE SUPP: 650.0000 mg | Freq: Four times a day (QID) | RECTAL | Status: DC | PRN

## 2021-06-30 MED ORDER — ENOXAPARIN SODIUM 80 MG/0.8ML IJ SOSY
0.5000 mg/kg | PREFILLED_SYRINGE | INTRAMUSCULAR | Status: DC
  Administered 2021-07-01 – 2021-07-10 (×10): 75 mg via SUBCUTANEOUS
  Filled 2021-06-30 (×3): qty 0.75
  Filled 2021-06-30 (×2): qty 0.8
  Filled 2021-06-30: qty 0.75
  Filled 2021-06-30: qty 0.8
  Filled 2021-06-30: qty 0.75
  Filled 2021-06-30: qty 0.8
  Filled 2021-06-30 (×4): qty 0.75

## 2021-06-30 MED ORDER — ACETAMINOPHEN 325 MG PO TABS
650.0000 mg | ORAL_TABLET | Freq: Four times a day (QID) | ORAL | Status: DC | PRN
  Administered 2021-07-03: 650 mg via ORAL
  Filled 2021-06-30: qty 2

## 2021-06-30 MED ORDER — CITALOPRAM HYDROBROMIDE 20 MG PO TABS: 20.0000 mg | ORAL_TABLET | Freq: Every day | ORAL | Status: DC

## 2021-06-30 MED ORDER — ONDANSETRON HCL 4 MG PO TABS: 4.0000 mg | ORAL_TABLET | Freq: Four times a day (QID) | ORAL | Status: DC | PRN

## 2021-06-30 MED ORDER — ONDANSETRON HCL 4 MG/2ML IJ SOLN
4.0000 mg | Freq: Four times a day (QID) | INTRAMUSCULAR | Status: DC | PRN
  Filled 2021-06-30: qty 2

## 2021-06-30 MED ORDER — SODIUM CHLORIDE 0.9 % IV SOLN
500.0000 mg | INTRAVENOUS | Status: DC
  Administered 2021-07-01: 500 mg via INTRAVENOUS
  Filled 2021-06-30: qty 500

## 2021-06-30 MED ORDER — PANTOPRAZOLE SODIUM 40 MG PO TBEC
40.0000 mg | DELAYED_RELEASE_TABLET | Freq: Every day | ORAL | Status: DC
  Administered 2021-06-30: 40 mg via ORAL
  Filled 2021-06-30: qty 1

## 2021-06-30 NOTE — Progress Notes (Signed)
[place]link following Code Sepsis protocol

## 2021-06-30 NOTE — Progress Notes (Signed)
PHARMACIST - PHYSICIAN COMMUNICATION  CONCERNING:  Enoxaparin (Lovenox) for DVT Prophylaxis    RECOMMENDATION: Patient was prescribed enoxaprin '40mg'$  q24 hours for VTE prophylaxis.   Filed Weights   06/30/21 1821  Weight: (!) 150 kg (330 lb 11 oz)    Body mass index is 51.79 kg/m.  Estimated Creatinine Clearance: 70.8 mL/min (A) (by C-G formula based on SCr of 1.1 mg/dL (H)).   Based on [place] patient is candidate for enoxaparin 0.'5mg'$ /kg TBW SQ every 24 hours based on BMI being >30.   DESCRIPTION: Pharmacy has adjusted enoxaparin dose per [facility] policy.  Patient is now receiving enoxaparin 75 mg every 24 hours    Sherilyn Banker, PharmD Clinical Pharmacist  06/30/2021 9:10 PM

## 2021-06-30 NOTE — ED Triage Notes (Signed)
Pt presents to the ED via EMS from home with c/o altered mental status. EMS states that they were notified by pt's family after pt began having episodes of confusion this morning. EMS states that pt is bed bound due to lymphedema and has not left her bed in over a year. Pt currently A&Ox3.

## 2021-06-30 NOTE — Progress Notes (Signed)
CODE SEPSIS - PHARMACY COMMUNICATION  **Broad Spectrum Antibiotics should be administered within 1 hour of Sepsis diagnosis**  Time Code Sepsis Called/Page Received: 1831  Antibiotics Ordered: ceftriaxone 2 g IV q24h  Time of 1st antibiotic administration: [place] ,PharmD Clinical Pharmacist  06/30/2021  6:32 PM

## 2021-06-30 NOTE — ED Provider Notes (Signed)
[facility] Emergency Department Provider Note  Time seen: 6:50 PM  I have reviewed the triage vital signs and the nursing notes.   HISTORY  Chief Complaint Altered Mental Status   HPI Kayla Long is a 75 y.o. female with a past medical history of hyperlipidemia, irritable bowel, chronic lymphedema, presents to the emergency department from home for altered mental status.  According to EMS patient lives at home has home health care to help the patient as well as patient's family.  Since this morning the family has noted that the patient has been confused.  No reported fever.  Here the patient is awake alert she is oriented.  Denies any chest pain abdominal pain, vomiting or diarrhea.  Denies any known dysuria.  Patient does state generalized fatigue.  Patient noted to be quite tachycardic around 140 bpm.  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 Authorizing Provider  amLODipine (NORVASC) 5 MG tablet Take 5 mg by mouth daily.      [provider]  citalopram (CELEXA) 20 MG tablet Take 20 mg by mouth daily.      [provider]  colchicine 0.6 MG tablet Take 0.6 mg by mouth 2 (two) times daily.      [provider]  etodolac (LODINE) 400 MG tablet Take 400 mg by mouth 2 (two) times daily as needed.      [provider]  furosemide (LASIX)  80 MG tablet Take 80 mg by mouth daily.      [provider]  HYDROcodone-acetaminophen (VICODIN) 5-500 MG per tablet Take 1 tablet by mouth 3 (three) times daily.      [provider]  hyoscyamine (ANASPAZ) 0.125 MG TBDP Take 0.125 mg by mouth 3 (three) times daily as needed.      [provider]  potassium chloride SA (K-DUR,KLOR-CON) 20 MEQ tablet Take 20 mEq by mouth daily as needed.      [provider]    Allergies  Allergen Reactions   Fluoxetine Hcl     REACTION: headache    No family history on file.  Social History Social History   Tobacco Use   Smoking status: Former   Tobacco comments:    off and on?  Substance Use Topics   Alcohol use: Yes    Comment: occasional     Review of Systems Constitutional: Negative for fever. Cardiovascular: Negative for chest pain. Respiratory: Negative for shortness of breath. Gastrointestinal: Negative for abdominal pain, vomiting and diarrhea. Genitourinary: Negative for urinary compaints Musculoskeletal: Buttocks pain. Skin: Chronic lower extremity lymphedema Neurological: Negative for headache All other ROS negative  ____________________________________________   PHYSICAL EXAM:  VITAL SIGNS: ED Triage Vitals  Enc Vitals Group     BP 06/30/21 1820 91/62     Pulse Rate 06/30/21 1820 (!) 143  Resp 06/30/21 1820 18     Temp 06/30/21 1820 98.9 F (37.2 C)     Temp Source 06/30/21 1820 Oral     SpO2 06/30/21 1820 98 %     Weight 06/30/21 1821 (!) 330 lb 11 oz (150 kg)     Height 06/30/21 1821 '5\' 7"'$  (1.702 m)     Head Circumference --      Peak Flow --      Pain Score 06/30/21 1821 0     Pain Loc --      Pain Edu? --      Excl. in [place]? --    Constitutional: Alert and oriented. Well appearing and in no distress. Eyes: Normal exam ENT      Head: Normocephalic and atraumatic.      Mouth/Throat: Mucous membranes are moist. Cardiovascular: Normal rate, regular rhythm.   Respiratory: Normal respiratory effort without tachypnea nor retractions. Breath sounds are clear  Gastrointestinal: Soft and nontender. No distention. Musculoskeletal: Patient has extreme lower extremity lymphedema, patient has ulcerations to the base of the right and left buttocks.  Stage I on the left stage II on the right. Neurologic:  Normal speech and language. No gross focal neurologic deficits  Skin: Decubitus ulcerations as described above. Psychiatric: Mood and affect are   ____________________________________________    EKG  EKG viewed and interpreted by myself shows sinus tachycardia 134 bpm with a narrow QRS, normal axis, slight QTC prolongation otherwise normal intervals with nonspecific ST changes.  ____________________________________________    RADIOLOGY  Chest x-ray shows low lung volumes.  Vascular congestion.  No pneumonia.  ____________________________________________   INITIAL IMPRESSION / ASSESSMENT AND PLAN / ED COURSE  Pertinent labs & imaging results that were available during my care of the patient were reviewed by me and considered in my medical decision making (see chart for details).   Patient presents to the emergency department for altered mental status/confusion since this morning.  Patient has severe chronic lymphedema, is bedbound.  Patient has home health care as well as patient's family helps take care of her.  They noted this morning she was confused.  Here the patient noted to be tachycardic around 140 bpm, afebrile.  Patient does have ulcerations to the right and left buttock, stage I on the left, stage II on the right.  Foul-smelling with mild drainage.  Could be a source for infection.  Given the patient's significant tachycardia and source for possible infection we will start the patient on broad-spectrum antibiotics, send cultures and continue to closely monitor.  Patient's white blood cell count has resulted positive at 26,000 again  consistent with likely sepsis.  Remainder the lab work is pending.  Patient's labs show significant leukocytosis, with a lactate of 3.5.  Patient is hypotensive receiving 1 L of fluids we will order an additional liter, which should equate to 30 mL/kg of ideal body weight.  Patient receiving broad-spectrum antibiotics.  Urinalysis consistent with urinary tract infection.  Cultures have been sent.  Patient will be admitted to the hospital service for further work-up and treatment.  Source of likely urinalysis versus decubitus ulceration.  Potassium will be repleted with oral Klor-Con.  Kayla Long was evaluated in Emergency Department on 06/30/2021 for the symptoms described in the history of present illness. She was evaluated in the context of the global COVID-19 pandemic, which necessitated consideration that the patient might be at risk for infection with the SARS-CoV-2 virus that causes COVID-19. Institutional protocols and algorithms that  pertain to the evaluation of patients at risk for COVID-19 are in a state of rapid change based on information released by regulatory bodies including the CDC and federal and state organizations. These policies and algorithms were followed during the patient's care in the ED.  CRITICAL CARE Performed by: Harvest Dark   Total critical care time: 30 minutes  Critical care time was exclusive of separately billable procedures and treating other patients.  Critical care was necessary to treat or prevent imminent or life-threatening deterioration.  Critical care was time spent personally by me on the following activities: development of treatment plan with patient and/or surrogate as well as nursing, discussions with consultants, evaluation of patient's response to treatment, examination of patient, obtaining history from patient or surrogate, ordering and performing treatments and interventions, ordering and review of laboratory studies, ordering and review  of radiographic studies, pulse oximetry and re-evaluation of patient's condition.  ____________________________________________   FINAL CLINICAL IMPRESSION(S) / ED DIAGNOSES  Sepsis Confusion Decubitus ulcer Urinary tract infection   Harvest Dark, MD 06/30/21 2035

## 2021-06-30 NOTE — H&P (Addendum)
History and Physical   Kayla Long [id number] DOB: 08/25/1946 DOA: 06/30/2021  PCP: Dr. Loraine Maple, [facility] Patient coming from: home via EMS  I have personally briefly reviewed patient's old medical records in [place].  Chief Concern: Lethargy, chills  HPI: Kayla Long is a 75 y.o. female with medical history significant for morbid obesity, history of hypertension, bilateral lymphedema, chronically bedbound, presents the emergency department for chief concerns of chills and lethargy.  At bedside patient is able to tell me her name, her age, the current calendar year, and her current location.  She appears to be awake and alert and oriented.  She reports that she is presenting to the emergency department for bilateral lower abdominal pain that started on day of presentation.  She denies headaches, shortness of breath, dysuria, diarrhea that she knows of.  She denies loss of consciousness.  She endorses burning in her chest after eating and reports this has been ongoing for two weeks.   Per daughter at bedside patient's blood pressure normally runs in the 0000000 systolic.  Social history: She lives at home with her daughters. She is a former smoker, quit 20 years ago. She drinks etoh, one drink of gin per day. She formerly worked in a [employer].   Vaccination history: she is vaccinated for covid 19, two doses of pfizer  ROS: Constitutional: no weight change, no fever ENT/Mouth: no sore throat, no rhinorrhea Eyes: no eye pain, no vision changes Cardiovascular: no chest pain, no dyspnea,  no edema, no palpitations Respiratory: no cough, no sputum, no wheezing Gastrointestinal: no nausea, no vomiting, no diarrhea, no constipation Genitourinary: no urinary incontinence, no dysuria, no hematuria Musculoskeletal: no arthralgias, no myalgias Skin: no skin lesions, no pruritus, Neuro: + weakness, no loss of consciousness, no syncope Psych: no anxiety, no  depression, + decrease appetite Heme/Lymph: no bruising, no bleeding  ED Course: Discussed with emergency medicine provider, patient requiring hospitalization for sepsis suspect secondary to urinary tract infection.  Vitals in the emergency department was remarkable for temperature 98.2, respiration rate of 22, heart rate 124, initial blood pressure 86/57, MAP is consistently been in the 50s, SPO2 of 91% on room air.  Labs in the emergency department was remarkable for WBC 26.9, hemoglobin 7.8, platelets 302, lactic acid 3.5, sodium level 134, potassium 3.6, chloride 100, bicarb 24, BUN 14, serum creatinine of 1.10, nonfasting blood glucose is 100.  UA was positive for large leukocytes.  Assessment/Plan  Principal Problem:   Sepsis secondary to UTI [facility]) Active Problems:   Sepsis associated hypotension (HCC)   History of hypertension   Lymphedema of upper extremity, bilateral   Morbid obesity with BMI of 50.0-59.9, adult (HCC)   IBS (irritable bowel syndrome)   # Sepsis, I suspect this is secondary to urinary tract infection and possible pneumonia - Procalcitonin ordered and elevated at 7.06, azithromycin added - Lactic acid 3.5, pending second lactic acid - Blood cultures x2 in process, urine culture in process - Added midodrine 10 mg 3 times daily - Getting LR sepsis bolus with LR IVF at 150 mL/h - Status post ceftriaxone 2 g IV per EDP-continue - Critical care has been consulted and will follow along remotely  # Anemia of chronic disease-hemoglobin 7.8 on admission - Per labs in 2020 her hemoglobin has been in the range of 7.5-9.6 - CBC in the a.m.  # Burning chest discomfort-I suspect this is secondary to GERD - Protonix 40 mg twice daily initiated -  Extensive discussion with patient and daughter at bedside that if her symptoms do not improve the next couple of days that she needs to discuss this with the a.m. hospitalist and or her PCP  # Hypokalemia-replaced with  potassium chloride 10 mEq every hour, x5 - Status post potassium chloride 40 mill equivalent p.o. per EDP  # History of hypertension-previously takes amlodipine 5 mg daily, this has been held at this time  # Depression/anxiety-citalopram 20 mg daily resumed  # Hypokalemia-status post potassium chloride 40 mill equivalent p.o.  Chart reviewed.   DVT prophylaxis: Enoxaparin weight-based subcutaneous daily Code Status: full code  Diet: heart healthy  Family Communication: updated daughter at bedside  Disposition Plan: pending clinical course  Consults called: critical care Admission status: inpatient, step-down, telemetry  Past Medical History:  Diagnosis Date   Hx of colonic polyps    tubulovillous adenoma   Hyperlipidemia    IBS (irritable bowel syndrome)    Spinal stenosis    with secondary neuropathy   Social History:  reports that she has quit smoking. She does not have any smokeless tobacco history on file. She reports current alcohol use. No history on file for drug use.  Allergies  Allergen Reactions   Fluoxetine Hcl     REACTION: headache   FMH: Brother passed from heart disease in his 78s  Family history: Family history reviewed and not pertinent  Prior to Admission medications   Medication Sig Start Date End Date Taking? Authorizing Provider  amLODipine (NORVASC) 5 MG tablet Take 5 mg by mouth daily.      [provider]  citalopram (CELEXA) 20 MG tablet Take 20 mg by mouth daily.      [provider]  colchicine 0.6 MG tablet Take 0.6 mg by mouth 2 (two) times daily.      [provider]  etodolac (LODINE) 400 MG tablet Take 400 mg by mouth 2 (two) times daily as needed.      [provider]  furosemide (LASIX) 80 MG tablet Take 80 mg by mouth daily.      [provider]  HYDROcodone-acetaminophen (VICODIN) 5-500 MG per tablet Take 1 tablet by mouth 3 (three) times daily.      [provider]  hyoscyamine  (ANASPAZ) 0.125 MG TBDP Take 0.125 mg by mouth 3 (three) times daily as needed.      [provider]  potassium chloride SA (K-DUR,KLOR-CON) 20 MEQ tablet Take 20 mEq by mouth daily as needed.      [provider]   Physical Exam: Vitals:   06/30/21 1900 06/30/21 1930 06/30/21 2000 06/30/21 2030  BP: (!) 86/57 (!) 88/45 (!) 81/46 (!) 77/46  Pulse: (!) 124 (!) 123 (!) 121 (!) 122  Resp: (!) 22 (!) 26 16 (!) 24  Temp:      TempSrc:      SpO2: 91% 95% 99% 100%  Weight:      Height:       Constitutional: appears age-appropriate, frail, NAD, calm, comfortable Eyes: PERRL, lids and conjunctivae normal ENMT: Mucous membranes are moist. Posterior pharynx clear of any exudate or lesions. Age-appropriate dentition. Hearing appropriate Neck: normal, supple, no masses, no thyromegaly Respiratory: clear to auscultation bilaterally, no wheezing, no crackles. Normal respiratory effort. No accessory muscle use.  Cardiovascular: Regular rate and rhythm, no murmurs / rubs / gallops. No extremity edema. 2+ pedal pulses. No carotid bruits.  Abdomen: Morbid obesity, no tenderness, no masses palpated, no hepatosplenomegaly. Bowel sounds  positive.  Musculoskeletal: no clubbing / cyanosis. No joint deformity upper and lower extremities.  Bilateral lower extremity lymphedema Skin: no rashes, lesions, ulcers. No induration Neurologic: Sensation intact. Strength 5/5 in all 4.  Psychiatric: Normal judgment and insight. Alert and oriented x 3. Normal mood.   EKG: independently reviewed, showing sinus tachycardia with rate of 134, QTc 519  Chest x-ray on Admission: I personally reviewed and I agree with radiologist reading as below.  DG Chest Port 1 View  Result Date: 06/30/2021 CLINICAL DATA:  Possible sepsis altered EXAM: PORTABLE CHEST 1 VIEW COMPARISON:  None available FINDINGS: Low lung volumes. Cardiomegaly with probable vascular congestion. Mild atelectasis or pneumonia at the bases. No  pneumothorax. IMPRESSION: Low lung volumes. Cardiomegaly with suspected central vascular congestion. Subsegmental atelectasis favored over pneumonia at the bases. Electronically Signed   By: Donavan Foil M.D.   On: 06/30/2021 18:50    Labs on Admission: I have personally reviewed following labs  CBC: Recent Labs  Lab 06/30/21 1830  WBC 26.9*  NEUTROABS 24.3*  HGB 7.8*  HCT 24.6*  MCV 90.4  PLT 99991111   Basic Metabolic Panel: Recent Labs  Lab 06/30/21 1830  NA 134*  K 2.6*  CL 100  CO2 24  GLUCOSE 100*  BUN 14  CREATININE 1.10*  CALCIUM 7.3*  MG 1.9   GFR: Estimated Creatinine Clearance: 70.8 mL/min (A) (by C-G formula based on SCr of 1.1 mg/dL (H)).  Liver Function Tests: Recent Labs  Lab 06/30/21 1830  AST 17  ALT 7  ALKPHOS 132*  BILITOT 0.8  PROT 6.5  ALBUMIN 1.6*   Coagulation Profile: Recent Labs  Lab 06/30/21 1830  INR 1.3*   Urine analysis:    Component Value Date/Time   COLORURINE YELLOW (A) 06/30/2021 2000   APPEARANCEUR TURBID (A) 06/30/2021 2000   LABSPEC 1.006 06/30/2021 2000   PHURINE 6.0 06/30/2021 2000   GLUCOSEU NEGATIVE 06/30/2021 2000   HGBUR MODERATE (A) 06/30/2021 2000   BILIRUBINUR NEGATIVE 06/30/2021 2000   KETONESUR NEGATIVE 06/30/2021 2000   PROTEINUR NEGATIVE 06/30/2021 2000   NITRITE NEGATIVE 06/30/2021 2000   LEUKOCYTESUR LARGE (A) 06/30/2021 2000   CRITICAL CARE Performed by: Briant Cedar Keirra Zeimet  Total critical care time: 40 minutes  Critical care time was exclusive of separately billable procedures and treating other patients.  Critical care was necessary to treat or prevent imminent or life-threatening deterioration.  Critical care was time spent personally by me on the following activities: development of treatment plan with patient and/or surrogate as well as nursing, discussions with consultants, evaluation of patient's response to treatment, examination of patient, obtaining history from patient or surrogate, ordering and  performing treatments and interventions, ordering and review of laboratory studies, ordering and review of radiographic studies, pulse oximetry and re-evaluation of patient's condition.  Dr. Tobie Poet [facility]  If 7PM-7AM, please contact overnight-coverage provider If 7AM-7PM, please contact day coverage provider [email or web address]  06/30/2021, 9:46 PM

## 2021-07-01 DIAGNOSIS — R6520 Severe sepsis without septic shock: Secondary | ICD-10-CM | POA: Diagnosis present

## 2021-07-01 DIAGNOSIS — A419 Sepsis, unspecified organism: Secondary | ICD-10-CM | POA: Diagnosis present

## 2021-07-01 LAB — COMPREHENSIVE METABOLIC PANEL
ALT: 7 U/L (ref 0–44)
AST: 12 U/L — ABNORMAL LOW (ref 15–41)
Albumin: 1.4 g/dL — ABNORMAL LOW (ref 3.5–5.0)
Alkaline Phosphatase: 113 U/L (ref 38–126)
Anion gap: 7 (ref 5–15)
BUN: 14 mg/dL (ref 8–23)
CO2: 25 mmol/L (ref 22–32)
Calcium: 7.3 mg/dL — ABNORMAL LOW (ref 8.9–10.3)
Chloride: 103 mmol/L (ref 98–111)
Creatinine, Ser: 1.05 mg/dL — ABNORMAL HIGH (ref 0.44–1.00)
GFR, Estimated: 56 mL/min — ABNORMAL LOW (ref >60)
Glucose, Bld: 115 mg/dL — ABNORMAL HIGH (ref 70–99)
Potassium: 2.9 mmol/L — ABNORMAL LOW (ref 3.5–5.1)
Sodium: 135 mmol/L (ref 135–145)
Total Bilirubin: 0.9 mg/dL (ref 0.3–1.2)
Total Protein: 5.8 g/dL — ABNORMAL LOW (ref 6.5–8.1)

## 2021-07-01 LAB — BLOOD CULTURE ID PANEL (REFLEXED) - BCID2

## 2021-07-01 LAB — PROTIME-INR
INR: 1.4 — ABNORMAL HIGH (ref 0.8–1.2)
Prothrombin Time: 16.7 seconds — ABNORMAL HIGH (ref 11.4–15.2)

## 2021-07-01 LAB — CBC
HCT: 22.2 % — ABNORMAL LOW (ref 36.0–46.0)
Hemoglobin: 7.2 g/dL — ABNORMAL LOW (ref 12.0–15.0)
MCH: 28.2 pg (ref 26.0–34.0)
MCHC: 32.4 g/dL (ref 30.0–36.0)
MCV: 87.1 fL (ref 80.0–100.0)
Platelets: 292 10*3/uL (ref 150–400)
RBC: 2.55 MIL/uL — ABNORMAL LOW (ref 3.87–5.11)
RDW: 16.9 % — ABNORMAL HIGH (ref 11.5–15.5)
WBC: 26.7 10*3/uL — ABNORMAL HIGH (ref 4.0–10.5)
nRBC: 0 % (ref 0.0–0.2)

## 2021-07-01 LAB — PROCALCITONIN: Procalcitonin: 8.39 ng/mL

## 2021-07-01 LAB — CORTISOL-AM, BLOOD: Cortisol - AM: 24.1 ug/dL — ABNORMAL HIGH (ref 6.7–22.6)

## 2021-07-01 MED ORDER — PIPERACILLIN-TAZOBACTAM 3.375 G IVPB
3.3750 g | Freq: Three times a day (TID) | INTRAVENOUS | Status: DC
  Administered 2021-07-01 – 2021-07-09 (×24): 3.375 g via INTRAVENOUS
  Filled 2021-07-01 (×24): qty 50

## 2021-07-01 MED ORDER — POTASSIUM CHLORIDE CRYS ER 20 MEQ PO TBCR
40.0000 meq | EXTENDED_RELEASE_TABLET | Freq: Two times a day (BID) | ORAL | Status: AC
  Administered 2021-07-01 (×2): 40 meq via ORAL
  Filled 2021-07-01 (×2): qty 2

## 2021-07-01 MED ORDER — PHENOL 1.4 % MT LIQD
1.0000 | OROMUCOSAL | Status: DC | PRN
  Administered 2021-07-02: 1 via OROMUCOSAL
  Filled 2021-07-01: qty 177

## 2021-07-01 MED ORDER — MORPHINE SULFATE (PF) 2 MG/ML IV SOLN
1.0000 mg | INTRAVENOUS | Status: DC | PRN
  Administered 2021-07-01 – 2021-07-07 (×5): 1 mg via INTRAVENOUS
  Filled 2021-07-01 (×5): qty 1

## 2021-07-01 MED ORDER — PIPERACILLIN-TAZOBACTAM 3.375 G IVPB 30 MIN
3.3750 g | Freq: Once | INTRAVENOUS | Status: AC
  Administered 2021-07-01: 3.375 g via INTRAVENOUS
  Filled 2021-07-01: qty 50

## 2021-07-01 MED ORDER — OXYCODONE-ACETAMINOPHEN 5-325 MG PO TABS
1.0000 | ORAL_TABLET | Freq: Four times a day (QID) | ORAL | Status: DC | PRN
Start: 2021-07-01 — End: 2021-07-11
  Administered 2021-07-02 – 2021-07-11 (×17): 1 via ORAL
  Filled 2021-07-01 (×17): qty 1

## 2021-07-01 NOTE — Plan of Care (Signed)
  Problem: Education: Goal: Knowledge of General Education information will improve Description: Including pain rating scale, medication(s)/side effects and non-pharmacologic comfort measures 07/01/2021 1726 by Cristela Blue, RN Outcome: Progressing 07/01/2021 1726 by Cristela Blue, RN Outcome: Progressing   Problem: Health Behavior/Discharge Planning: Goal: Ability to manage health-related needs will improve 07/01/2021 1726 by Cristela Blue, RN Outcome: Progressing 07/01/2021 1726 by Cristela Blue, RN Outcome: Progressing   Problem: Clinical Measurements: Goal: Ability to maintain clinical measurements within normal limits will improve 07/01/2021 1726 by Cristela Blue, RN Outcome: Progressing 07/01/2021 1726 by Cristela Blue, RN Outcome: Progressing Goal: Will remain free from infection 07/01/2021 1726 by Cristela Blue, RN Outcome: Progressing 07/01/2021 1726 by Cristela Blue, RN Outcome: Progressing Goal: Diagnostic test results will improve 07/01/2021 1726 by Cristela Blue, RN Outcome: Progressing 07/01/2021 1726 by Cristela Blue, RN Outcome: Progressing Goal: Respiratory complications will improve 07/01/2021 1726 by Cristela Blue, RN Outcome: Progressing 07/01/2021 1726 by Cristela Blue, RN Outcome: Progressing Goal: Cardiovascular complication will be avoided 07/01/2021 1726 by Cristela Blue, RN Outcome: Progressing 07/01/2021 1726 by Cristela Blue, RN Outcome: Progressing   Problem: Activity: Goal: Risk for activity intolerance will decrease 07/01/2021 1726 by Cristela Blue, RN Outcome: Progressing 07/01/2021 1726 by Cristela Blue, RN Outcome: Progressing   Problem: Nutrition: Goal: Adequate nutrition will be maintained 07/01/2021 1726 by Cristela Blue, RN Outcome: Progressing 07/01/2021 1726 by Cristela Blue, RN Outcome: Progressing   Problem: Coping: Goal: Level of anxiety will decrease 07/01/2021 1726 by Cristela Blue, RN Outcome:  Progressing 07/01/2021 1726 by Cristela Blue, RN Outcome: Progressing   Problem: Elimination: Goal: Will not experience complications related to bowel motility 07/01/2021 1726 by Cristela Blue, RN Outcome: Progressing 07/01/2021 1726 by Cristela Blue, RN Outcome: Progressing Goal: Will not experience complications related to urinary retention 07/01/2021 1726 by Cristela Blue, RN Outcome: Progressing 07/01/2021 1726 by Cristela Blue, RN Outcome: Progressing   Problem: Pain Managment: Goal: General experience of comfort will improve 07/01/2021 1726 by Cristela Blue, RN Outcome: Progressing 07/01/2021 1726 by Cristela Blue, RN Outcome: Progressing   Problem: Safety: Goal: Ability to remain free from injury will improve 07/01/2021 1726 by Cristela Blue, RN Outcome: Progressing 07/01/2021 1726 by Cristela Blue, RN Outcome: Progressing   Problem: Skin Integrity: Goal: Risk for impaired skin integrity will decrease 07/01/2021 1726 by Cristela Blue, RN Outcome: Progressing 07/01/2021 1726 by Cristela Blue, RN Outcome: Progressing

## 2021-07-01 NOTE — Progress Notes (Signed)
PHARMACY - PHYSICIAN COMMUNICATION CRITICAL VALUE ALERT - BLOOD CULTURE IDENTIFICATION (BCID)  Kayla Long is an 75 y.o. female who presented to [facility] on 06/30/2021 with a chief complaint of lethargy / chills  Assessment:  4/4 bottles GNR. BCID detected Enterobacterales. No species identified. Suspect urinary source. Intra-abdominal source also a possibility.   Name of physician (or Provider) Contacted: Dr. Jimmye Norman  Current antibiotics: Ceftriaxone + azithromycin  Changes to prescribed antibiotics recommended:  Change to Zosyn monotherapy  Results for orders placed or performed during the hospital encounter of 06/30/21  Blood Culture ID Panel (Reflexed) (Collected: 06/30/2021  6:30 PM)  Result Value Ref Range   Enterococcus faecalis NOT DETECTED NOT DETECTED   Enterococcus Faecium NOT DETECTED NOT DETECTED   Listeria monocytogenes NOT DETECTED NOT DETECTED   Staphylococcus species NOT DETECTED NOT DETECTED   Staphylococcus aureus (BCID) NOT DETECTED NOT DETECTED   Staphylococcus epidermidis NOT DETECTED NOT DETECTED   Staphylococcus lugdunensis NOT DETECTED NOT DETECTED   Streptococcus species NOT DETECTED NOT DETECTED   Streptococcus agalactiae NOT DETECTED NOT DETECTED   Streptococcus pneumoniae NOT DETECTED NOT DETECTED   Streptococcus pyogenes NOT DETECTED NOT DETECTED   A.calcoaceticus-baumannii NOT DETECTED NOT DETECTED   Bacteroides fragilis NOT DETECTED NOT DETECTED   Enterobacterales DETECTED (A) NOT DETECTED   Enterobacter cloacae complex NOT DETECTED NOT DETECTED   Escherichia coli NOT DETECTED NOT DETECTED   Klebsiella aerogenes NOT DETECTED NOT DETECTED   Klebsiella oxytoca NOT DETECTED NOT DETECTED   Klebsiella pneumoniae NOT DETECTED NOT DETECTED   Proteus species NOT DETECTED NOT DETECTED   Salmonella species NOT DETECTED NOT DETECTED   Serratia marcescens NOT DETECTED NOT DETECTED   Haemophilus influenzae NOT DETECTED NOT DETECTED   Neisseria  meningitidis NOT DETECTED NOT DETECTED   Pseudomonas aeruginosa NOT DETECTED NOT DETECTED   Stenotrophomonas maltophilia NOT DETECTED NOT DETECTED   Candida albicans NOT DETECTED NOT DETECTED   Candida auris NOT DETECTED NOT DETECTED   Candida glabrata NOT DETECTED NOT DETECTED   Candida krusei NOT DETECTED NOT DETECTED   Candida parapsilosis NOT DETECTED NOT DETECTED   Candida tropicalis NOT DETECTED NOT DETECTED   Cryptococcus neoformans/gattii NOT DETECTED NOT DETECTED   CTX-M ESBL NOT DETECTED NOT DETECTED   Carbapenem resistance IMP NOT DETECTED NOT DETECTED   Carbapenem resistance KPC NOT DETECTED NOT DETECTED   Carbapenem resistance NDM NOT DETECTED NOT DETECTED   Carbapenem resist OXA 48 LIKE NOT DETECTED NOT DETECTED   Carbapenem resistance VIM NOT DETECTED NOT DETECTED    Benita Gutter 07/01/2021  8:49 AM

## 2021-07-01 NOTE — ED Notes (Signed)
Lab at bedside

## 2021-07-01 NOTE — Progress Notes (Signed)
PROGRESS NOTE    Kayla Long  JWL:[id number] DOB: Jul 02, 1946 DOA: 06/30/2021 PCP: Venia Carbon, MD  Assessment & Plan:   Principal Problem:   Sepsis secondary to UTI [facility]) Active Problems:   Sepsis associated hypotension ([place])   History of hypertension   Lymphedema of upper extremity, bilateral   Morbid obesity with BMI of 50.0-59.9, adult (HCC)   IBS (irritable bowel syndrome)   Sepsis: met criteria w/ tachycardia, tachypnea, leukocytosis, elevated lactic acid & likely UTI & bacteremia. Procal 7.06. Continue on IVF. Abx changed to IV zosyn   Bacteremia: blood cxs growing gram neg rods. Continue on IV abxs. Source unclear. Pt is bedbound  UTI: UA is positive. Urine cx is pending. Continue on IV zosyn   Leukocytosis: secondary to infection. Continue on IV abxs  ACD: H&H are labile. Will transfuse if Hb <7.0   Likely CKD: baseline Cr is unknown, currently stage IIIa. Cr is trending down from day prior    Burning chest discomfort: likely secondary to GERD. Continue on PPI    Hypokalemia: KCl repleated   HTN: will continue to hold home dose of amlodipine secondary to sepsis   Depression: severity unknown. Continue on home dose of citalopram   Morbid obesity: BMI 51.7. Complicates overall care & prognosis  Severe Lymphedema: of b/l LE. Continue w/ supportive care  DVT prophylaxis: lovenox  Code Status: full  Family Communication: discussed pt's care w/ pt's daughter at bedside and answered her questions  Disposition Plan: unclear  Level of care: PCU  Status is: Inpatient  Remains inpatient appropriate because:Ongoing diagnostic testing needed not appropriate for outpatient work up, Unsafe d/c plan, IV treatments appropriate due to intensity of illness or inability to take PO, and Inpatient level of care appropriate due to severity of illness  Dispo: The patient is from: Home              Anticipated d/c is to:  unclear              Patient currently is not  medically stable to d/c.   Difficult to place patient : unclear   Consultants:    Procedures:  Antimicrobials: zosyn   Subjective: Pt c/o malaise   Objective: Vitals:   07/01/21 0100 07/01/21 0115 07/01/21 0500 07/01/21 0612  BP: (!) 94/57 (!) 112/95 96/65 (!) 107/52  Pulse: (!) 104 (!) 108 96 95  Resp:  (!) 25 20 18   Temp:    97.8 F (36.6 C)  TempSrc:    Oral  SpO2: 98% 100% 97% 97%  Weight:      Height:        Intake/Output Summary (Last 24 hours) at 07/01/2021 0814 Last data filed at 07/01/2021 0253 Gross per 24 hour  Intake 349.83 ml  Output --  Net 349.83 ml   Filed Weights   06/30/21 1821  Weight: (!) 150 kg    Examination:  General exam: Appears calm but uncomfortable. Morbidly obese  Respiratory system: diminished breath sounds b/l  Cardiovascular system: S1 & S2 +. No  rubs, gallops or clicks. B/l LE edema Gastrointestinal system: Abdomen is obese, soft and nontender. Hypoactive bowel sounds Central nervous system: Alert and oriented.  Extremities: severe lymphedema of b/l LE w/ dry, tough & thickened skin of b/l LE   Psychiatry: Judgement and insight appear normal. Flat mood and affect.     Data Reviewed: I have personally reviewed following labs and imaging studies  CBC: Recent Labs  Lab 06/30/21 1830  WBC 26.9*  NEUTROABS 24.3*  HGB 7.8*  HCT 24.6*  MCV 90.4  PLT 893   Basic Metabolic Panel: Recent Labs  Lab 06/30/21 1830  NA 134*  K 2.6*  CL 100  CO2 24  GLUCOSE 100*  BUN 14  CREATININE 1.10*  CALCIUM 7.3*  MG 1.9   GFR: Estimated Creatinine Clearance: 70.8 mL/min (A) (by C-G formula based on SCr of 1.1 mg/dL (H)). Liver Function Tests: Recent Labs  Lab 06/30/21 1830  AST 17  ALT 7  ALKPHOS 132*  BILITOT 0.8  PROT 6.5  ALBUMIN 1.6*   No results for input(s): LIPASE, AMYLASE in the last 168 hours. No results for input(s): AMMONIA in the last 168 hours. Coagulation Profile: Recent Labs  Lab 06/30/21 1830  07/01/21 0606  INR 1.3* 1.4*   Cardiac Enzymes: No results for input(s): CKTOTAL, CKMB, CKMBINDEX, TROPONINI in the last 168 hours. BNP (last 3 results) No results for input(s): PROBNP in the last 8760 hours. HbA1C: No results for input(s): HGBA1C in the last 72 hours. CBG: No results for input(s): GLUCAP in the last 168 hours. Lipid Profile: No results for input(s): CHOL, HDL, LDLCALC, TRIG, CHOLHDL, LDLDIRECT in the last 72 hours. Thyroid Function Tests: No results for input(s): TSH, T4TOTAL, FREET4, T3FREE, THYROIDAB in the last 72 hours. Anemia Panel: No results for input(s): VITAMINB12, FOLATE, FERRITIN, TIBC, IRON, RETICCTPCT in the last 72 hours. Sepsis Labs: Recent Labs  Lab 06/30/21 1830 06/30/21 2204 07/01/21 0606  PROCALCITON  --  7.06 8.39  LATICACIDVEN 3.5* 2.0*  --     Recent Results (from the past 240 hour(s))  Resp Panel by RT-PCR (Flu A&B, Covid) Nasopharyngeal Swab     Status: None   Collection Time: 06/30/21  6:30 PM   Specimen: Nasopharyngeal Swab; Nasopharyngeal(NP) swabs in vial transport medium  Result Value Ref Range Status   SARS Coronavirus 2 by RT PCR NEGATIVE NEGATIVE Final    Comment: (NOTE) SARS-CoV-2 target nucleic acids are NOT DETECTED.  The SARS-CoV-2 RNA is generally detectable in upper respiratory specimens during the acute phase of infection. The lowest concentration of SARS-CoV-2 viral copies this assay can detect is 138 copies/mL. A negative result does not preclude SARS-Cov-2 infection and should not be used as the sole basis for treatment or other patient management decisions. A negative result may occur with  improper specimen collection/handling, submission of specimen other than nasopharyngeal swab, presence of viral mutation(s) within the areas targeted by this assay, and inadequate number of viral copies(<138 copies/mL). A negative result must be combined with clinical observations, patient history, and  epidemiological information. The expected result is Negative.  Fact Sheet for Patients:  EntrepreneurPulse.com.au  Fact Sheet for Healthcare Providers:  IncredibleEmployment.be  This test is no t yet approved or cleared by the [place] FDA and  has been authorized for detection and/or diagnosis of SARS-CoV-2 by FDA under an Emergency Use Authorization (EUA). This EUA will remain  in effect (meaning this test can be used) for the duration of the COVID-19 declaration under Section 564(b)(1) of the Act, 21 U.S.C.section 360bbb-3(b)(1), unless the authorization is terminated  or revoked sooner.       Influenza A by PCR NEGATIVE NEGATIVE Final   Influenza B by PCR NEGATIVE NEGATIVE Final    Comment: (NOTE) The Xpert Xpress SARS-CoV-2/FLU/RSV plus assay is intended as an aid in the diagnosis of influenza from Nasopharyngeal swab specimens and should not be used as a sole basis for treatment. Nasal washings  and aspirates are unacceptable for Xpert Xpress SARS-CoV-2/FLU/RSV testing.  Fact Sheet for Patients: EntrepreneurPulse.com.au  Fact Sheet for Healthcare Providers: IncredibleEmployment.be  This test is not yet approved or cleared by the [place] FDA and has been authorized for detection and/or diagnosis of SARS-CoV-2 by FDA under an Emergency Use Authorization (EUA). This EUA will remain in effect (meaning this test can be used) for the duration of the COVID-19 declaration under Section 564(b)(1) of the Act, 21 U.S.C. section 360bbb-3(b)(1), unless the authorization is terminated or revoked.  Performed at [facility], [address] [place], [place] [address]   Blood Culture (routine x 2)     Status: None (Preliminary result)   Collection Time: 06/30/21  6:30 PM   Specimen: BLOOD  Result Value Ref Range Status   Specimen Description BLOOD RIGHT ANTECUBITAL  Final   Special  Requests   Final    BOTTLES DRAWN AEROBIC AND ANAEROBIC Blood Culture results may not be optimal due to an inadequate volume of blood received in culture bottles   Culture  Setup Time   Final    GRAM NEGATIVE RODS IN BOTH AEROBIC AND ANAEROBIC BOTTLES Organism ID to follow Performed at [facility], [address] [place], [place] [address]    Culture GRAM NEGATIVE RODS  Final   Report Status PENDING  Incomplete  Blood Culture (routine x 2)     Status: None (Preliminary result)   Collection Time: 06/30/21  6:35 PM   Specimen: BLOOD  Result Value Ref Range Status   Specimen Description BLOOD LEFT ANTECUBITAL  Final   Special Requests   Final    BOTTLES DRAWN AEROBIC AND ANAEROBIC Blood Culture adequate volume   Culture   Final    NO GROWTH < 12 HOURS Performed at [facility], [address], [place], [place] [address]    Report Status PENDING  Incomplete         Radiology Studies: DG Chest Port 1 View  Result Date: 06/30/2021 CLINICAL DATA:  Possible sepsis altered EXAM: PORTABLE CHEST 1 VIEW COMPARISON:  None available FINDINGS: Low lung volumes. Cardiomegaly with probable vascular congestion. Mild atelectasis or pneumonia at the bases. No pneumothorax. IMPRESSION: Low lung volumes. Cardiomegaly with suspected central vascular congestion. Subsegmental atelectasis favored over pneumonia at the bases. Electronically Signed   By: Donavan Foil M.D.   On: 06/30/2021 18:50        Scheduled Meds:  enoxaparin (LOVENOX) injection  0.5 mg/kg Subcutaneous Q24H   midodrine  10 mg Oral TID WC   pantoprazole  40 mg Oral BID AC   PARoxetine  30 mg Oral Daily   Continuous Infusions:  azithromycin Stopped (07/01/21 0146)   cefTRIAXone (ROCEPHIN)  IV Stopped (06/30/21 1931)   lactated ringers 150 mL/hr at 07/01/21 0216     LOS: 1 day    Time spent: 35 mins     Wyvonnia Dusky, MD [facility] Pager [phone]  If 7PM-7AM, please contact  night-coverage 07/01/2021, 8:14 AM

## 2021-07-02 LAB — COMPREHENSIVE METABOLIC PANEL
ALT: 13 U/L (ref 0–44)
AST: 28 U/L (ref 15–41)
Albumin: 3.1 g/dL — ABNORMAL LOW (ref 3.5–5.0)
Alkaline Phosphatase: 118 U/L (ref 38–126)
Anion gap: 10 (ref 5–15)
BUN: 42 mg/dL — ABNORMAL HIGH (ref 8–23)
CO2: 26 mmol/L (ref 22–32)
Calcium: 8.5 mg/dL — ABNORMAL LOW (ref 8.9–10.3)
Chloride: 97 mmol/L — ABNORMAL LOW (ref 98–111)
Creatinine, Ser: 0.85 mg/dL (ref 0.44–1.00)
GFR, Estimated: >60 mL/min (ref >60)
Glucose, Bld: 102 mg/dL — ABNORMAL HIGH (ref 70–99)
Potassium: 3 mmol/L — ABNORMAL LOW (ref 3.5–5.1)
Sodium: 133 mmol/L — ABNORMAL LOW (ref 135–145)
Total Bilirubin: 1.9 mg/dL — ABNORMAL HIGH (ref 0.3–1.2)
Total Protein: 5.9 g/dL — ABNORMAL LOW (ref 6.5–8.1)

## 2021-07-02 LAB — URINE CULTURE

## 2021-07-02 LAB — CBC
HCT: 23.3 % — ABNORMAL LOW (ref 36.0–46.0)
Hemoglobin: 7.5 g/dL — ABNORMAL LOW (ref 12.0–15.0)
MCH: 28.2 pg (ref 26.0–34.0)
MCHC: 32.2 g/dL (ref 30.0–36.0)
MCV: 87.6 fL (ref 80.0–100.0)
Platelets: 279 10*3/uL (ref 150–400)
RBC: 2.66 MIL/uL — ABNORMAL LOW (ref 3.87–5.11)
RDW: 17.1 % — ABNORMAL HIGH (ref 11.5–15.5)
WBC: 16.9 10*3/uL — ABNORMAL HIGH (ref 4.0–10.5)
nRBC: 0 % (ref 0.0–0.2)

## 2021-07-02 LAB — MAGNESIUM: Magnesium: 1.8 mg/dL (ref 1.7–2.4)

## 2021-07-02 LAB — PHOSPHORUS: Phosphorus: 2.5 mg/dL (ref 2.5–4.6)

## 2021-07-02 MED ORDER — SODIUM CHLORIDE 0.9 % IV SOLN: INTRAVENOUS | Status: DC

## 2021-07-02 MED ORDER — MAGNESIUM SULFATE 2 GM/50ML IV SOLN
2.0000 g | Freq: Once | INTRAVENOUS | Status: AC
  Administered 2021-07-02: 2 g via INTRAVENOUS
  Filled 2021-07-02: qty 50

## 2021-07-02 MED ORDER — POTASSIUM CHLORIDE CRYS ER 20 MEQ PO TBCR
40.0000 meq | EXTENDED_RELEASE_TABLET | Freq: Two times a day (BID) | ORAL | Status: AC
  Administered 2021-07-02 (×2): 40 meq via ORAL
  Filled 2021-07-02 (×2): qty 2

## 2021-07-02 NOTE — Progress Notes (Signed)
PROGRESS NOTE    Kayla Long  ZOX:[id number] DOB: October 13, 1946 DOA: 06/30/2021 PCP: Venia Carbon, MD  Assessment & Plan:   Principal Problem:   Sepsis secondary to UTI [facility]) Active Problems:   Sepsis associated hypotension ([place])   History of hypertension   Lymphedema of upper extremity, bilateral   Morbid obesity with BMI of 50.0-59.9, adult (HCC)   IBS (irritable bowel syndrome)   Sepsis ([place])   Sepsis: met criteria w/ tachycardia, tachypnea, leukocytosis, elevated lactic acid & likely UTI & bacteremia. Procal 8.39. Continue on IVF. Continue on IV zosyn   Bacteremia: blood cxs growing citrobacter koseri, sens pending. Will repeat blood cxs tomorrow. Continue on IV zosyn. Source is unknown. Bedbound   UTI: UA is positive. Urine cx shows multiple species present. Continue on IV zosyn   Leukocytosis: secondary to infection. Continue on IV abxs  ACD: H&H are labile. Will transfuse if Hb <7.0   Likely CKD: baseline Cr is unknown, currently stage IIIa. Cr is trending down from day prior.   Burning chest discomfort: likely secondary to GERD. Continue on PPI    Hypokalemia: KCl repleated. Mg sulfate given   HTN: will continue to hold home dose of amlodipine secondary to sepsis   Depression: severity unknown. Continue on home dose of citalopram   Morbid obesity: BMI 69.8. Complicates overall care and prognosis   Severe Lymphedema: of b/l LE. Continue w/ supportive care  DVT prophylaxis: lovenox  Code Status: full  Family Communication: discussed pt's care w/ pt's daughter, [name], her questions  Disposition Plan: unclear  Level of care: PCU  Status is: Inpatient  Remains inpatient appropriate because:Ongoing diagnostic testing needed not appropriate for outpatient work up, Unsafe d/c plan, IV treatments appropriate due to intensity of illness or inability to take PO, and Inpatient level of care appropriate due to severity of illness  Dispo: The patient is from:  Home              Anticipated d/c is to:  unclear              Patient currently is not medically stable to d/c.   Difficult to place patient : unclear   Consultants:    Procedures:  Antimicrobials: zosyn   Subjective: Pt c/o malaise   Objective: Vitals:   07/01/21 1628 07/01/21 2046 07/01/21 2048 07/02/21 0345  BP: (!) 103/50  (!) 106/51 (!) 96/51  Pulse: 66 81 79 80  Resp: 18   18  Temp: 97.8 F (36.6 C) 97.6 F (36.4 C)  97.7 F (36.5 C)  TempSrc: Oral Oral  Oral  SpO2: 100% 100% 99% 100%  Weight: (!) 196.3 kg     Height: $Remove'5\' 6"'[id number]$  (1.676 m)       Intake/Output Summary (Last 24 hours) at 07/02/2021 0811 Last data filed at 07/02/2021 8119 Gross per 24 hour  Intake 2131.21 ml  Output 1150 ml  Net 981.21 ml   Filed Weights   06/30/21 1821 07/01/21 1628  Weight: (!) 150 kg (!) 196.3 kg    Examination:  General exam: Appears uncomfortable Respiratory system: decreased breath sounds b/l  Cardiovascular system: S1/S2+. No rubs or clicks  Gastrointestinal system: Abd is soft, NT, obese & hypoactive bowel sounds  Central nervous system: Alert and oriented. Extremities: severe lymphedema of b/l LE w/ dry, tough & thickened skin of b/l LE   Psychiatry: Judgement and insight appear normal. Flat mood and affect     Data Reviewed: I have personally reviewed  following labs and imaging studies  CBC: Recent Labs  Lab 06/30/21 1830 07/01/21 0858 07/02/21 0625  WBC 26.9* 26.7* 16.9*  NEUTROABS 24.3*  --   --   HGB 7.8* 7.2* 7.5*  HCT 24.6* 22.2* 23.3*  MCV 90.4 87.1 87.6  PLT 302 292 677   Basic Metabolic Panel: Recent Labs  Lab 06/30/21 1830 07/01/21 0858 07/02/21 0554  NA 134* 135 133*  K 2.6* 2.9* 3.0*  CL 100 103 97*  CO2 _0 GLUCOSE 100* 115* 102*  BUN 14 14 42*  CREATININE 1.10* 1.05* 0.85  CALCIUM 7.3* 7.3* 8.5*  MG 1.9  --  1.8  PHOS  --   --  2.5   GFR: Estimated Creatinine Clearance: 107.8 mL/min (by C-G formula based on SCr of 0.85  mg/dL). Liver Function Tests: Recent Labs  Lab 06/30/21 1830 07/01/21 0858 07/02/21 0554  AST 17 12* 28  ALT _1 ALKPHOS 132* 113 118  BILITOT 0.8 0.9 1.9*  PROT 6.5 5.8* 5.9*  ALBUMIN 1.6* 1.4* 3.1*   No results for input(s): LIPASE, AMYLASE in the last 168 hours. No results for input(s): AMMONIA in the last 168 hours. Coagulation Profile: Recent Labs  Lab 06/30/21 1830 07/01/21 0606  INR 1.3* 1.4*   Cardiac Enzymes: No results for input(s): CKTOTAL, CKMB, CKMBINDEX, TROPONINI in the last 168 hours. BNP (last 3 results) No results for input(s): PROBNP in the last 8760 hours. HbA1C: No results for input(s): HGBA1C in the last 72 hours. CBG: No results for input(s): GLUCAP in the last 168 hours. Lipid Profile: No results for input(s): CHOL, HDL, LDLCALC, TRIG, CHOLHDL, LDLDIRECT in the last 72 hours. Thyroid Function Tests: No results for input(s): TSH, T4TOTAL, FREET4, T3FREE, THYROIDAB in the last 72 hours. Anemia Panel: No results for input(s): VITAMINB12, FOLATE, FERRITIN, TIBC, IRON, RETICCTPCT in the last 72 hours. Sepsis Labs: Recent Labs  Lab 06/30/21 1830 06/30/21 2204 07/01/21 0606  PROCALCITON  --  7.06 8.39  LATICACIDVEN 3.5* 2.0*  --     Recent Results (from the past 240 hour(s))  Resp Panel by RT-PCR (Flu A&B, Covid) Nasopharyngeal Swab     Status: None   Collection Time: 06/30/21  6:30 PM   Specimen: Nasopharyngeal Swab; Nasopharyngeal(NP) swabs in vial transport medium  Result Value Ref Range Status   SARS Coronavirus 2 by RT PCR NEGATIVE NEGATIVE Final    Comment: (NOTE) SARS-CoV-2 target nucleic acids are NOT DETECTED.  The SARS-CoV-2 RNA is generally detectable in upper respiratory specimens during the acute phase of infection. The lowest concentration of SARS-CoV-2 viral copies this assay can detect is 138 copies/mL. A negative result does not preclude SARS-Cov-2 infection and should not be used as the sole basis for treatment  or other patient management decisions. A negative result may occur with  improper specimen collection/handling, submission of specimen other than nasopharyngeal swab, presence of viral mutation(s) within the areas targeted by this assay, and inadequate number of viral copies(<138 copies/mL). A negative result must be combined with clinical observations, patient history, and epidemiological information. The expected result is Negative.  Fact Sheet for Patients:  EntrepreneurPulse.com.au  Fact Sheet for Healthcare Providers:  IncredibleEmployment.be  This test is no t yet approved or cleared by the [place] FDA and  has been authorized for detection and/or diagnosis of SARS-CoV-2 by FDA under an Emergency Use Authorization (EUA). This EUA will remain  in effect (meaning this test can be used) for the duration of  the COVID-19 declaration under Section 564(b)(1) of the Act, 21 U.S.C.section 360bbb-3(b)(1), unless the authorization is terminated  or revoked sooner.       Influenza A by PCR NEGATIVE NEGATIVE Final   Influenza B by PCR NEGATIVE NEGATIVE Final    Comment: (NOTE) The Xpert Xpress SARS-CoV-2/FLU/RSV plus assay is intended as an aid in the diagnosis of influenza from Nasopharyngeal swab specimens and should not be used as a sole basis for treatment. Nasal washings and aspirates are unacceptable for Xpert Xpress SARS-CoV-2/FLU/RSV testing.  Fact Sheet for Patients: EntrepreneurPulse.com.au  Fact Sheet for Healthcare Providers: IncredibleEmployment.be  This test is not yet approved or cleared by the [place] FDA and has been authorized for detection and/or diagnosis of SARS-CoV-2 by FDA under an Emergency Use Authorization (EUA). This EUA will remain in effect (meaning this test can be used) for the duration of the COVID-19 declaration under Section 564(b)(1) of the Act, 21 U.S.C. section  360bbb-3(b)(1), unless the authorization is terminated or revoked.  Performed at [facility], [address] [place], [place] [address]   Blood Culture (routine x 2)     Status: None (Preliminary result)   Collection Time: 06/30/21  6:30 PM   Specimen: BLOOD  Result Value Ref Range Status   Specimen Description   Final    BLOOD RIGHT ANTECUBITAL Performed at [facility], [address], [place], [place] [address]    Special Requests   Final    BOTTLES DRAWN AEROBIC AND ANAEROBIC Blood Culture results may not be optimal due to an inadequate volume of blood received in culture bottles Performed at [facility], [address], [place], [place] [address]    Culture  Setup Time   Final    GRAM NEGATIVE RODS IN BOTH AEROBIC AND ANAEROBIC BOTTLES CRITICAL RESULT CALLED TO, READ BACK BY AND VERIFIED WITH: ALEX CHAPPELL AT [place] 07/01/21.PMF Performed at [facility], [place] [address] [place], [place] [address]    Culture GRAM NEGATIVE RODS  Final   Report Status PENDING  Incomplete  Blood Culture ID Panel (Reflexed)     Status: Abnormal   Collection Time: 06/30/21  6:30 PM  Result Value Ref Range Status   Enterococcus faecalis NOT DETECTED NOT DETECTED Final   Enterococcus Faecium NOT DETECTED NOT DETECTED Final   Listeria monocytogenes NOT DETECTED NOT DETECTED Final   Staphylococcus species NOT DETECTED NOT DETECTED Final   Staphylococcus aureus (BCID) NOT DETECTED NOT DETECTED Final   Staphylococcus epidermidis NOT DETECTED NOT DETECTED Final   Staphylococcus lugdunensis NOT DETECTED NOT DETECTED Final   Streptococcus species NOT DETECTED NOT DETECTED Final   Streptococcus agalactiae NOT DETECTED NOT DETECTED Final   Streptococcus pneumoniae NOT DETECTED NOT DETECTED Final   Streptococcus pyogenes NOT DETECTED NOT DETECTED Final   A.calcoaceticus-baumannii NOT DETECTED NOT DETECTED Final   Bacteroides fragilis NOT DETECTED NOT DETECTED Final    Enterobacterales DETECTED (A) NOT DETECTED Final    Comment: Enterobacterales represent a large order of gram negative bacteria, not a single organism. Refer to culture for further identification. CRITICAL RESULT CALLED TO, READ BACK BY AND VERIFIED WITH: ALEX CHAPPELL AT [place] 07/01/21.PMF    Enterobacter cloacae complex NOT DETECTED NOT DETECTED Final   Escherichia coli NOT DETECTED NOT DETECTED Final   Klebsiella aerogenes NOT DETECTED NOT DETECTED Final   Klebsiella oxytoca NOT DETECTED NOT DETECTED Final   Klebsiella pneumoniae NOT DETECTED NOT DETECTED Final   Proteus species NOT DETECTED NOT DETECTED  Final   Salmonella species NOT DETECTED NOT DETECTED Final   Serratia marcescens NOT DETECTED NOT DETECTED Final   Haemophilus influenzae NOT DETECTED NOT DETECTED Final   Neisseria meningitidis NOT DETECTED NOT DETECTED Final   Pseudomonas aeruginosa NOT DETECTED NOT DETECTED Final   Stenotrophomonas maltophilia NOT DETECTED NOT DETECTED Final   Candida albicans NOT DETECTED NOT DETECTED Final   Candida auris NOT DETECTED NOT DETECTED Final   Candida glabrata NOT DETECTED NOT DETECTED Final   Candida krusei NOT DETECTED NOT DETECTED Final   Candida parapsilosis NOT DETECTED NOT DETECTED Final   Candida tropicalis NOT DETECTED NOT DETECTED Final   Cryptococcus neoformans/gattii NOT DETECTED NOT DETECTED Final   CTX-M ESBL NOT DETECTED NOT DETECTED Final   Carbapenem resistance IMP NOT DETECTED NOT DETECTED Final   Carbapenem resistance KPC NOT DETECTED NOT DETECTED Final   Carbapenem resistance NDM NOT DETECTED NOT DETECTED Final   Carbapenem resist OXA 48 LIKE NOT DETECTED NOT DETECTED Final   Carbapenem resistance VIM NOT DETECTED NOT DETECTED Final    Comment: Performed at [facility], [address] [place], [place] [address]  Blood Culture (routine x 2)     Status: None (Preliminary result)   Collection Time: 06/30/21  6:35 PM   Specimen: BLOOD  Result Value Ref  Range Status   Specimen Description   Final    BLOOD LEFT ANTECUBITAL Performed at [facility], [address] [place], [place] [address]    Special Requests   Final    BOTTLES DRAWN AEROBIC AND ANAEROBIC Blood Culture adequate volume Performed at [facility], [address] [place], [place] [address]    Culture  Setup Time   Final    GRAM NEGATIVE RODS IN BOTH AEROBIC AND ANAEROBIC BOTTLES CRITICAL VALUE NOTED.  VALUE IS CONSISTENT WITH PREVIOUSLY REPORTED AND CALLED VALUE. Performed at [facility], [place] [address] [place], [place] [address]    Culture GRAM NEGATIVE RODS  Final   Report Status PENDING  Incomplete  Urine Culture     Status: Abnormal   Collection Time: 06/30/21  8:00 PM   Specimen: In/Out Cath Urine  Result Value Ref Range Status   Specimen Description   Final    IN/OUT CATH URINE Performed at [facility], [address] [place], [place] [address]    Special Requests   Final    NONE Performed at [facility], [address] [place], [place] [address]    Culture MULTIPLE SPECIES PRESENT, SUGGEST RECOLLECTION (A)  Final   Report Status 07/02/2021 FINAL  Final         Radiology Studies: DG Chest Port 1 View  Result Date: 06/30/2021 CLINICAL DATA:  Possible sepsis altered EXAM: PORTABLE CHEST 1 VIEW COMPARISON:  None available FINDINGS: Low lung volumes. Cardiomegaly with probable vascular congestion. Mild atelectasis or pneumonia at the bases. No pneumothorax. IMPRESSION: Low lung volumes. Cardiomegaly with suspected central vascular congestion. Subsegmental atelectasis favored over pneumonia at the bases. Electronically Signed   By: Donavan Foil M.D.   On: 06/30/2021 18:50        Scheduled Meds:  enoxaparin (LOVENOX) injection  0.5 mg/kg Subcutaneous Q24H   midodrine  10 mg Oral TID WC   pantoprazole  40 mg Oral BID AC   PARoxetine  30 mg Oral Daily   potassium chloride  40 mEq Oral BID    Continuous Infusions:  sodium chloride     magnesium sulfate bolus IVPB  piperacillin-tazobactam (ZOSYN)  IV 3.375 g (07/02/21 0515)     LOS: 2 days    Time spent: 2 mins     Wyvonnia Dusky, MD [facility] Pager [phone]  If 7PM-7AM, please contact night-coverage 07/02/2021, 8:11 AM

## 2021-07-02 NOTE — TOC Progression Note (Signed)
Transition of Care [facility]) - Progression Note    Patient Details  Name: Kayla Long MRN: [id number] Date of Birth: 10-Jul-1946  [facility] CM/SW Contact  Izola Price, RN Phone Number: 07/02/2021, 2:27 PM  Clinical Narrative: 8/14: From Home: Admitted 8/12 ED with UTI/Sepsis/hypotension secondary to sepsis, increased Lactic Acid, +Blood cultures, unstable blood count. EDD: Unclear at this time. NMS 07/01/21. Simmie Davies RN CM            Expected Discharge Plan and Services                                                 Social Determinants of Health (SDOH) Interventions    Readmission Risk Interventions No flowsheet data found.

## 2021-07-03 DIAGNOSIS — R7881 Bacteremia: Secondary | ICD-10-CM | POA: Diagnosis not present

## 2021-07-03 DIAGNOSIS — Z6841 Body Mass Index (BMI) 40.0 and over, adult: Secondary | ICD-10-CM

## 2021-07-03 DIAGNOSIS — I890 Lymphedema, not elsewhere classified: Secondary | ICD-10-CM | POA: Diagnosis not present

## 2021-07-03 LAB — CBC
HCT: 22.8 % — ABNORMAL LOW (ref 36.0–46.0)
Hemoglobin: 7.3 g/dL — ABNORMAL LOW (ref 12.0–15.0)
MCH: 27.8 pg (ref 26.0–34.0)
MCHC: 32 g/dL (ref 30.0–36.0)
MCV: 86.7 fL (ref 80.0–100.0)
Platelets: 261 10*3/uL (ref 150–400)
RBC: 2.63 MIL/uL — ABNORMAL LOW (ref 3.87–5.11)
RDW: 17 % — ABNORMAL HIGH (ref 11.5–15.5)
WBC: 17 10*3/uL — ABNORMAL HIGH (ref 4.0–10.5)
nRBC: 0 % (ref 0.0–0.2)

## 2021-07-03 LAB — CULTURE, BLOOD (ROUTINE X 2): Special Requests: ADEQUATE

## 2021-07-03 LAB — COMPREHENSIVE METABOLIC PANEL
ALT: 7 U/L (ref 0–44)
AST: 14 U/L — ABNORMAL LOW (ref 15–41)
Albumin: 1.4 g/dL — ABNORMAL LOW (ref 3.5–5.0)
Alkaline Phosphatase: 102 U/L (ref 38–126)
Anion gap: 6 (ref 5–15)
BUN: 16 mg/dL (ref 8–23)
CO2: 27 mmol/L (ref 22–32)
Calcium: 7.4 mg/dL — ABNORMAL LOW (ref 8.9–10.3)
Chloride: 104 mmol/L (ref 98–111)
Creatinine, Ser: 1.06 mg/dL — ABNORMAL HIGH (ref 0.44–1.00)
GFR, Estimated: 56 mL/min — ABNORMAL LOW (ref >60)
Glucose, Bld: 87 mg/dL (ref 70–99)
Potassium: 3.6 mmol/L (ref 3.5–5.1)
Sodium: 137 mmol/L (ref 135–145)
Total Bilirubin: 0.5 mg/dL (ref 0.3–1.2)
Total Protein: 5.5 g/dL — ABNORMAL LOW (ref 6.5–8.1)

## 2021-07-03 LAB — PHOSPHORUS: Phosphorus: 2.5 mg/dL (ref 2.5–4.6)

## 2021-07-03 LAB — MAGNESIUM: Magnesium: 1.7 mg/dL (ref 1.7–2.4)

## 2021-07-03 MED ORDER — ENSURE ENLIVE PO LIQD
237.0000 mL | Freq: Three times a day (TID) | ORAL | Status: DC
  Administered 2021-07-03 – 2021-07-10 (×16): 237 mL via ORAL

## 2021-07-03 NOTE — Consult Note (Signed)
WOC Nurse Consult Note: Patient receiving care in [facility]. Patient requires 5 - 6 staff to turn and perform care. Reason for Consult:dry, crusty skin to BLE, pustules to bilateral hip regions, unstageable to buttocks Wound type: 99% of what staff were concerned about are the chronic soft tissue changes associated with extreme LE lymphedema.  The "pustules" to bilateral  hips were round, hardened areas (outpocketing) of soft tissue, again associated with woody lymphedema--not actual open wounds. The unstageable buttocks wounds were actually pink, dry, healed areas associated with incontinence of urine and stool.  There were 2 scattered VERY small areas being protected by foam dressings that weren't quite healed over.  The foam dressings should provide good protection and opportunity to heal.   I did see one small, approximately 1 cm x 1 cm shallow area on the right upper, lateral hip area that was pink.  This easily could have occurred during turning and positioning. Pressure Injury POA: Yes Measurement: Wound bed: Drainage (amount, consistency, odor)  Periwound: Dressing procedure/placement/frequency: The patient is on a Sizewise bed with foam mattress.   Monitor the wound area(s) for worsening of condition such as: Signs/symptoms of infection,  Increase in size,  Development of or worsening of odor, Development of pain, or increased pain at the affected locations.  Notify the medical team if any of these develop.  Thank you for the consult.  Discussed plan of care with the patient and bedside nurse.  [place] nurse will not follow at this time.  Please re-consult the [place] team if needed.  Val Riles, RN, MSN, CWOCN, CNS-BC, pager [phone]

## 2021-07-03 NOTE — Care Management Important Message (Signed)
Important Message  Patient Details  Name: MAUDELLA LANGILL MRN: [id number] Date of Birth: 08-31-1946   Medicare Important Message Given:  N/A - LOS <3 / Initial given by admissions     Juliann Pulse A Nilda Keathley 07/03/2021, 12:07 PM

## 2021-07-03 NOTE — Consult Note (Addendum)
NAME: Kayla Long  DOB: [birth year]  MRN: [id number]  Date/Time: 07/03/2021 12:53 PM  REQUESTING PROVIDER: Dr. Jimmye Norman Subjective:  REASON FOR CONSULT: Citrobacter bacteremia ? Kayla Long is a 75 y.o. with a history of significant morbid obesity, hypertension, bilateral lymphedema, chronically bedbound presents to the ED with chief concerns of  lethargy, altered mental status as per family and presence of chills.  In the ED she was able to tell her name and her age current candidate and location.  She was complaining of  abdominal pain.  She was complaining of burning in the chest after eating.She did not have any history of headache, shortness of breath dysuria or diarrhea.   Vitals in the ED temperature 98.2, respiratory rate 20, heart rate 124, BP 86/57.  Labs revealed WBC of 26.9, Hb 7.8, PLT 302, lactate 3.5, sodium 134, K3.6, CL 100, HCO3 24, BUN 14, CR 1.10.  Blood glucose was 100. UA was positive for more than 50 WBCs.  Blood culture and urine culture was sent and patient was started on ceftriaxone. Asked to see the patient for Citrobacter bacteremia. Patient is bedbound for few years now. She has severe lymphedema/lipedema of the lower extremities and unable to bear weight. She is helped by her daughters at home. She denies any cough shortness of breath nausea vomiting.  She does not have any trouble passing urine or dysuria.  Past Past Medical History:  Diagnosis Date   Hx of colonic polyps    tubulovillous adenoma   Hyperlipidemia    IBS (irritable bowel syndrome)    Spinal stenosis    with secondary neuropathy    Past Surgical History:  Procedure Laterality Date   PARTIAL HYSTERECTOMY     vaginal deliveries     x2   VESICOVAGINAL FISTULA CLOSURE W/ TAH      Social History   Socioeconomic History   Marital status: Divorced    Spouse name: Not on file   Number of children: Not on file   Years of education: Not on file   Highest education level: Not on  file  Occupational History   Not on file  Tobacco Use   Smoking status: Former    Types: Cigarettes   Smokeless tobacco: Never   Tobacco comments:    off and on?  Substance and Sexual Activity   Alcohol use: Yes    Comment: occasional    Drug use: Never   Sexual activity: Not Currently  Other Topics Concern   Not on file  Social History Narrative   Divorced,  2 children.    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   Daughter-Fances Laidler [phone]   Social Determinants of Health   Financial Resource Strain: Not on file  Food Insecurity: Not on file  Transportation Needs: Not on file  Physical Activity: Not on file  Stress: Not on file  Social Connections: Not on file  Intimate Partner Violence: Not on file    Family History  Problem Relation Age of Onset   Stroke Mother    Stroke Father    Heart disease Brother    Allergies  Allergen Reactions   Fluoxetine Hcl     REACTION: headache   I? Current Facility-Administered Medications  Medication Dose Route Frequency Provider Last Rate Last Admin   0.9 %  sodium chloride infusion   Intravenous Continuous Wyvonnia Dusky, MD 75 mL/hr at 07/03/21 0359 Infusion Verify at 07/03/21 [phone]  acetaminophen (TYLENOL) tablet 650 mg  650 mg Oral Q6H PRN Cox, Amy N, DO       Or   acetaminophen (TYLENOL) suppository 650 mg  650 mg Rectal Q6H PRN Cox, Amy N, DO       enoxaparin (LOVENOX) injection 75 mg  0.5 mg/kg Subcutaneous Q24H Cox, Amy N, DO   75 mg at 07/02/21 2102   feeding supplement (ENSURE ENLIVE / ENSURE PLUS) liquid 237 mL  237 mL Oral TID Wyvonnia Dusky, MD       midodrine (PROAMATINE) tablet 10 mg  10 mg Oral TID WC Cox, Amy N, DO   10 mg at 07/03/21 0836   morphine 2 MG/ML injection 1 mg  1 mg Intravenous Q4H PRN Wyvonnia Dusky, MD   1 mg at 07/01/21 1358   ondansetron (ZOFRAN) tablet 4 mg  4 mg Oral Q6H PRN Cox, Amy N, DO       Or   ondansetron (ZOFRAN)  injection 4 mg  4 mg Intravenous Q6H PRN Cox, Amy N, DO       oxyCODONE-acetaminophen (PERCOCET/ROXICET) 5-325 MG per tablet 1 tablet  1 tablet Oral Q6H PRN Wyvonnia Dusky, MD   1 tablet at 07/03/21 0836   pantoprazole (PROTONIX) EC tablet 40 mg  40 mg Oral BID AC Cox, Amy N, DO   40 mg at 07/03/21 0836   PARoxetine (PAXIL) tablet 30 mg  30 mg Oral Daily Cox, Amy N, DO   30 mg at 07/03/21 0836   phenol (CHLORASEPTIC) mouth spray 1 spray  1 spray Mouth/Throat PRN Wyvonnia Dusky, MD   1 spray at 07/02/21 1234   piperacillin-tazobactam (ZOSYN) IVPB 3.375 g  3.375 g Intravenous Q8H Wyvonnia Dusky, MD 12.5 mL/hr at 07/03/21 0411 3.375 g at 07/03/21 0411     Abtx:  Anti-infectives (From admission, onward)    Start     Dose/Rate Route Frequency Ordered Stop   07/01/21 1400  piperacillin-tazobactam (ZOSYN) IVPB 3.375 g       See Hyperspace for full Linked Orders Report.   3.375 g 12.5 mL/hr over 240 Minutes Intravenous Every 8 hours 07/01/21 0853     07/01/21 0900  piperacillin-tazobactam (ZOSYN) IVPB 3.375 g       See Hyperspace for full Linked Orders Report.   3.375 g 100 mL/hr over 30 Minutes Intravenous  Once 07/01/21 0853 07/01/21 1100   06/30/21 2345  azithromycin (ZITHROMAX) 500 mg in sodium chloride 0.9 % 250 mL IVPB  Status:  Discontinued        500 mg 250 mL/hr over 60 Minutes Intravenous Every 24 hours 06/30/21 2342 07/01/21 0853   06/30/21 2115  cefTRIAXone (ROCEPHIN) 2 g in sodium chloride 0.9 % 100 mL IVPB  Status:  Discontinued        2 g 200 mL/hr over 30 Minutes Intravenous Every 24 hours 06/30/21 2106 06/30/21 2107   06/30/21 1845  cefTRIAXone (ROCEPHIN) 2 g in sodium chloride 0.9 % 100 mL IVPB  Status:  Discontinued        2 g 200 mL/hr over 30 Minutes Intravenous Every 24 hours 06/30/21 1830 07/01/21 0852       REVIEW OF SYSTEMS:  Const: negative fever, + chills, negative weight loss Eyes: negative diplopia or visual changes, negative eye pain ENT:  negative coryza, negative sore throat Resp: negative cough, hemoptysis, dyspnea Cards:+for chest pain, palpitations, has sever lower extremity edema GU: negative for frequency, dysuria and hematuria GI: +abdominal pain,  No diarrhea, bleeding, constipation Skin: negative for rash and pruritus Heme: negative for easy bruising and gum/nose bleeding MS: Muscle weakness and lethargic Neurolo:negative for headaches, dizziness, vertigo, memory problems  Psych: negative for feelings of anxiety, depression  Endocrine: negative for thyroid, diabetes Allergy/Immunology-as above  Objective:  VITALS:  BP (!) 103/45 (BP Location: Right Arm)   Pulse 79   Temp 97.7 F (36.5 C)   Resp 17   Ht '5\' 6"'$  (1.676 m)   Wt (!) 196.3 kg   SpO2 100%   BMI 69.85 kg/m  PHYSICAL EXAM:  General: Alert, cooperative, no distress, Head: Normocephalic, without obvious abnormality, atraumatic. Eyes: Conjunctivae clear, anicteric sclerae. Pupils are equal ENT Nares normal. No drainage or sinus tenderness. Lips, mucosa, and tongue normal. No Thrush Neck: Supple, symmetrical, no adenopathy, thyroid: non tender no carotid bruit and no JVD. Back: No CVA tenderness. Lungs: Clear to auscultation bilaterally. No Wheezing or Rhonchi. No rales. Heart: Regular rate and rhythm, no murmur, rub or gallop. Abdomen: Soft, non-tender,not distended. Bowel sounds normal. No masses Extremities: Upper extremities normal Lower extremity severe lymphedema/lipedema from hip downwards with extensive lichenification          Skin: as above Lymph: Cervical, supraclavicular normal. Neurologic: weakness legs Pertinent Labs Lab Results CBC    Component Value Date/Time   WBC 17.0 (H) 07/03/2021 0525   RBC 2.63 (L) 07/03/2021 0525   HGB 7.3 (L) 07/03/2021 0525   HCT 22.8 (L) 07/03/2021 0525   PLT 261 07/03/2021 0525   MCV 86.7 07/03/2021 0525   MCH 27.8 07/03/2021 0525   MCHC 32.0 07/03/2021 0525   RDW 17.0 (H) 07/03/2021  0525   LYMPHSABS 0.9 06/30/2021 1830   MONOABS 1.1 (H) 06/30/2021 1830   EOSABS 0.1 06/30/2021 1830   BASOSABS 0.1 06/30/2021 1830    CMP Latest Ref Rng & Units 07/03/2021 07/02/2021 07/01/2021  Glucose 70 - 99 mg/dL 87 102(H) 115(H)  BUN 8 - 23 mg/dL 16 42(H) 14  Creatinine 0.44 - 1.00 mg/dL 1.06(H) 0.85 1.05(H)  Sodium 135 - 145 mmol/L 137 133(L) 135  Potassium 3.5 - 5.1 mmol/L 3.6 3.0(L) 2.9(L)  Chloride 98 - 111 mmol/L 104 97(L) 103  CO2 22 - 32 mmol/L '27 26 25  '$ Calcium 8.9 - 10.3 mg/dL 7.4(L) 8.5(L) 7.3(L)  Total Protein 6.5 - 8.1 g/dL 5.5(L) 5.9(L) 5.8(L)  Total Bilirubin 0.3 - 1.2 mg/dL 0.5 1.9(H) 0.9  Alkaline Phos 38 - 126 U/L 102 118 113  AST 15 - 41 U/L 14(L) 28 12(L)  ALT 0 - 44 U/L '7 13 7      '$ Microbiology: Recent Results (from the past 240 hour(s))  Resp Panel by RT-PCR (Flu A&B, Covid) Nasopharyngeal Swab     Status: None   Collection Time: 06/30/21  6:30 PM   Specimen: Nasopharyngeal Swab; Nasopharyngeal(NP) swabs in vial transport medium  Result Value Ref Range Status   SARS Coronavirus 2 by RT PCR NEGATIVE NEGATIVE Final    Comment: (NOTE) SARS-CoV-2 target nucleic acids are NOT DETECTED.  The SARS-CoV-2 RNA is generally detectable in upper respiratory specimens during the acute phase of infection. The lowest concentration of SARS-CoV-2 viral copies this assay can detect is 138 copies/mL. A negative result does not preclude SARS-Cov-2 infection and should not be used as the sole basis for treatment or other patient management decisions. A negative result may occur with  improper specimen collection/handling, submission of specimen other than nasopharyngeal swab, presence of viral mutation(s) within the areas targeted by  this assay, and inadequate number of viral copies(<138 copies/mL). A negative result must be combined with clinical observations, patient history, and epidemiological information. The expected result is Negative.  Fact Sheet for Patients:   EntrepreneurPulse.com.au  Fact Sheet for Healthcare Providers:  IncredibleEmployment.be  This test is no t yet approved or cleared by the [place] FDA and  has been authorized for detection and/or diagnosis of SARS-CoV-2 by FDA under an Emergency Use Authorization (EUA). This EUA will remain  in effect (meaning this test can be used) for the duration of the COVID-19 declaration under Section 564(b)(1) of the Act, 21 U.S.C.section 360bbb-3(b)(1), unless the authorization is terminated  or revoked sooner.       Influenza A by PCR NEGATIVE NEGATIVE Final   Influenza B by PCR NEGATIVE NEGATIVE Final    Comment: (NOTE) The Xpert Xpress SARS-CoV-2/FLU/RSV plus assay is intended as an aid in the diagnosis of influenza from Nasopharyngeal swab specimens and should not be used as a sole basis for treatment. Nasal washings and aspirates are unacceptable for Xpert Xpress SARS-CoV-2/FLU/RSV testing.  Fact Sheet for Patients: EntrepreneurPulse.com.au  Fact Sheet for Healthcare Providers: IncredibleEmployment.be  This test is not yet approved or cleared by the [place] FDA and has been authorized for detection and/or diagnosis of SARS-CoV-2 by FDA under an Emergency Use Authorization (EUA). This EUA will remain in effect (meaning this test can be used) for the duration of the COVID-19 declaration under Section 564(b)(1) of the Act, 21 U.S.C. section 360bbb-3(b)(1), unless the authorization is terminated or revoked.  Performed at [facility], [address] [place], [place] [address]   Blood Culture (routine x 2)     Status: Abnormal   Collection Time: 06/30/21  6:30 PM   Specimen: BLOOD  Result Value Ref Range Status   Specimen Description   Final    BLOOD RIGHT ANTECUBITAL Performed at [facility], [address] [place], [place] [address]    Special Requests   Final     BOTTLES DRAWN AEROBIC AND ANAEROBIC Blood Culture results may not be optimal due to an inadequate volume of blood received in culture bottles Performed at [facility], [address] [place], [place] [address]    Culture  Setup Time   Final    GRAM NEGATIVE RODS IN BOTH AEROBIC AND ANAEROBIC BOTTLES CRITICAL RESULT CALLED TO, READ BACK BY AND VERIFIED WITH: ALEX CHAPPELL AT [place] 07/01/21.PMF Performed at [facility], [place] [address] [place], [place] [address]    Culture CITROBACTER KOSERI (A)  Final   Report Status 07/03/2021 FINAL  Final   Organism ID, Bacteria CITROBACTER KOSERI  Final      Susceptibility   Citrobacter koseri - MIC*    CEFAZOLIN <=4 SENSITIVE Sensitive     CEFEPIME <=0.12 SENSITIVE Sensitive     CEFTAZIDIME <=1 SENSITIVE Sensitive     CEFTRIAXONE <=0.25 SENSITIVE Sensitive     CIPROFLOXACIN <=0.25 SENSITIVE Sensitive     GENTAMICIN <=1 SENSITIVE Sensitive     IMIPENEM <=0.25 SENSITIVE Sensitive     TRIMETH/SULFA <=20 SENSITIVE Sensitive     PIP/TAZO <=4 SENSITIVE Sensitive     * CITROBACTER KOSERI  Blood Culture ID Panel (Reflexed)     Status: Abnormal   Collection Time: 06/30/21  6:30 PM  Result Value Ref Range Status   Enterococcus faecalis NOT DETECTED NOT DETECTED Final   Enterococcus Faecium NOT DETECTED NOT DETECTED Final   Listeria monocytogenes NOT DETECTED NOT DETECTED  Final   Staphylococcus species NOT DETECTED NOT DETECTED Final   Staphylococcus aureus (BCID) NOT DETECTED NOT DETECTED Final   Staphylococcus epidermidis NOT DETECTED NOT DETECTED Final   Staphylococcus lugdunensis NOT DETECTED NOT DETECTED Final   Streptococcus species NOT DETECTED NOT DETECTED Final   Streptococcus agalactiae NOT DETECTED NOT DETECTED Final   Streptococcus pneumoniae NOT DETECTED NOT DETECTED Final   Streptococcus pyogenes NOT DETECTED NOT DETECTED Final   A.calcoaceticus-baumannii NOT DETECTED NOT DETECTED Final   Bacteroides fragilis NOT DETECTED  NOT DETECTED Final   Enterobacterales DETECTED (A) NOT DETECTED Final    Comment: Enterobacterales represent a large order of gram negative bacteria, not a single organism. Refer to culture for further identification. CRITICAL RESULT CALLED TO, READ BACK BY AND VERIFIED WITH: ALEX CHAPPELL AT [place] 07/01/21.PMF    Enterobacter cloacae complex NOT DETECTED NOT DETECTED Final   Escherichia coli NOT DETECTED NOT DETECTED Final   Klebsiella aerogenes NOT DETECTED NOT DETECTED Final   Klebsiella oxytoca NOT DETECTED NOT DETECTED Final   Klebsiella pneumoniae NOT DETECTED NOT DETECTED Final   Proteus species NOT DETECTED NOT DETECTED Final   Salmonella species NOT DETECTED NOT DETECTED Final   Serratia marcescens NOT DETECTED NOT DETECTED Final   Haemophilus influenzae NOT DETECTED NOT DETECTED Final   Neisseria meningitidis NOT DETECTED NOT DETECTED Final   Pseudomonas aeruginosa NOT DETECTED NOT DETECTED Final   Stenotrophomonas maltophilia NOT DETECTED NOT DETECTED Final   Candida albicans NOT DETECTED NOT DETECTED Final   Candida auris NOT DETECTED NOT DETECTED Final   Candida glabrata NOT DETECTED NOT DETECTED Final   Candida krusei NOT DETECTED NOT DETECTED Final   Candida parapsilosis NOT DETECTED NOT DETECTED Final   Candida tropicalis NOT DETECTED NOT DETECTED Final   Cryptococcus neoformans/gattii NOT DETECTED NOT DETECTED Final   CTX-M ESBL NOT DETECTED NOT DETECTED Final   Carbapenem resistance IMP NOT DETECTED NOT DETECTED Final   Carbapenem resistance KPC NOT DETECTED NOT DETECTED Final   Carbapenem resistance NDM NOT DETECTED NOT DETECTED Final   Carbapenem resist OXA 48 LIKE NOT DETECTED NOT DETECTED Final   Carbapenem resistance VIM NOT DETECTED NOT DETECTED Final    Comment: Performed at [facility], [address] [place], [place] [address]  Blood Culture (routine x 2)     Status: Abnormal   Collection Time: 06/30/21  6:35 PM   Specimen: BLOOD  Result Value  Ref Range Status   Specimen Description   Final    BLOOD LEFT ANTECUBITAL Performed at [facility], [address] [place], [place] [address]    Special Requests   Final    BOTTLES DRAWN AEROBIC AND ANAEROBIC Blood Culture adequate volume Performed at [facility], [address] [place], [place] [address]    Culture  Setup Time   Final    GRAM NEGATIVE RODS IN BOTH AEROBIC AND ANAEROBIC BOTTLES CRITICAL VALUE NOTED.  VALUE IS CONSISTENT WITH PREVIOUSLY REPORTED AND CALLED VALUE.    Culture (A)  Final    CITROBACTER KOSERI SUSCEPTIBILITIES PERFORMED ON PREVIOUS CULTURE WITHIN THE LAST 5 DAYS. Performed at [facility], [place] [address], [place], [place] [address]    Report Status 07/03/2021 FINAL  Final  Urine Culture     Status: Abnormal   Collection Time: 06/30/21  8:00 PM   Specimen: In/Out Cath Urine  Result Value Ref Range Status   Specimen Description   Final    IN/OUT CATH URINE Performed at [facility], [address], [place], [place] [address]    Special Requests   Final    NONE Performed at [facility], [address] [place], [place] [address]    Culture MULTIPLE SPECIES PRESENT, SUGGEST RECOLLECTION (A)  Final   Report Status 07/02/2021 FINAL  Final    IMAGING RESULTS:  I have personally reviewed the films Bibasilar  atelectasis  Will Severe lymphedema/lipedema of the lower extremity with extensive lichenification of the skin.  Some discharge present  Citrobacter bacteremia.  Source could be the skin  ? Urinary tract Continue  Zosyn.  On discharge has a PO option ( quinolone)  Anemia.   Epigastric and retrosternal pain.  GERD /gastritis ? Immobility secondary to the lipedema. ___________________________________________________ Discussed the management with the patient and her nurses. Note:  This document was prepared using Dragon voice recognition software and may include unintentional dictation  errors.

## 2021-07-03 NOTE — Progress Notes (Signed)
PROGRESS NOTE    Kayla Long  ZOX:[id number] DOB: 1946-10-15 DOA: 06/30/2021 PCP: Venia Carbon, MD  Assessment & Plan:   Principal Problem:   Sepsis secondary to UTI [facility]) Active Problems:   Sepsis associated hypotension ([place])   History of hypertension   Lymphedema of upper extremity, bilateral   Morbid obesity with BMI of 50.0-59.9, adult (HCC)   IBS (irritable bowel syndrome)   Sepsis ([place])   Sepsis: met criteria w/ tachycardia, tachypnea, leukocytosis, elevated lactic acid & likely UTI & bacteremia. Procal 8.39. Continue on IVF. Continue on IV zosyn. Resolved   Bacteremia: blood cxs growing citrobacter koseri, sens pending. Repeat blood cxs ordered. Continue on IV zosyn. Source is unknown. Bedbound. ID consulted   UTI: UA is positive. Urine cx shows multiple species present. Continue on IV zosyn  Sacral decubitus ulcer: unstageable. Continue w/ wound care    Leukocytosis: secondary to infection. Continue on IV abxs   ACD: H&H are labile. Will transfuse if Hb <7.0   Likely CKD: baseline Cr is unknown, currently stage IIIa. Cr is trending down from day prior.   Burning chest discomfort: likely secondary to GERD. Continue on PPI    Hypokalemia: WNL today   HTN: will continue to hold home dose of amlodipine secondary to sepsis   Depression: severity unknown. Continue on home dose of citalopram   Morbid obesity: BMI 69.8. Complicates overall care and prognosis   Severe Lymphedema: of b/l LE. Continue w/ supportive care   DVT prophylaxis: lovenox  Code Status: full  Family Communication:  Disposition Plan: unclear  Level of care: PCU  Status is: Inpatient  Remains inpatient appropriate because:Ongoing diagnostic testing needed not appropriate for outpatient work up, Unsafe d/c plan, IV treatments appropriate due to intensity of illness or inability to take PO, and Inpatient level of care appropriate due to severity of illness  Dispo: The patient is  from: Home              Anticipated d/c is to:  unclear              Patient currently is not medically stable to d/c.   Difficult to place patient : unclear   Consultants:    Procedures:  Antimicrobials: zosyn   Subjective: Pt c/o sore throat  Objective: Vitals:   07/02/21 1228 07/02/21 1637 07/02/21 2039 07/03/21 0811  BP: (!) 92/50 104/61 (!) 102/47 (!) 96/50  Pulse:   85 86  Resp: 20  18 18   Temp: (!) 97.4 F (36.3 C) (!) 97.5 F (36.4 C) 97.8 F (36.6 C) 97.6 F (36.4 C)  TempSrc: Oral Oral    SpO2: 100% 98% 99% 100%  Weight:      Height:        Intake/Output Summary (Last 24 hours) at 07/03/2021 0817 Last data filed at 07/03/2021 0806 Gross per 24 hour  Intake 1233.74 ml  Output 550 ml  Net 683.74 ml   Filed Weights   06/30/21 1821 07/01/21 1628  Weight: (!) 150 kg (!) 196.3 kg    Examination:  General exam: Appears calm but uncomfortable  Respiratory system: diminished breath sounds b/l. No rubs or clicks Cardiovascular system: S1 & S2+. No rubs or clicks Gastrointestinal system: Abd is soft, NT, obese & hypoactive bowel sounds  Central nervous system: Alert and oriented. Extremities: severe lymphedema of b/l LE w/ dry, tough & thickened skin of b/l LE   Psychiatry: Judgement and insight appear normal. Flat mood and affect  Data Reviewed: I have personally reviewed following labs and imaging studies  CBC: Recent Labs  Lab 06/30/21 1830 07/01/21 0858 07/02/21 0625 07/03/21 0525  WBC 26.9* 26.7* 16.9* 17.0*  NEUTROABS 24.3*  --   --   --   HGB 7.8* 7.2* 7.5* 7.3*  HCT 24.6* 22.2* 23.3* 22.8*  MCV 90.4 87.1 87.6 86.7  PLT 302 292 279 081   Basic Metabolic Panel: Recent Labs  Lab 06/30/21 1830 07/01/21 0858 07/02/21 0554 07/03/21 0525  NA 134* 135 133* 137  K 2.6* 2.9* 3.0* 3.6  CL 100 103 97* 104  CO2 24 25 26 27   GLUCOSE 100* 115* 102* 87  BUN 14 14 42* 16  CREATININE 1.10* 1.05* 0.85 1.06*  CALCIUM 7.3* 7.3* 8.5* 7.4*  MG  1.9  --  1.8 1.7  PHOS  --   --  2.5 2.5   GFR: Estimated Creatinine Clearance: 86.4 mL/min (A) (by C-G formula based on SCr of 1.06 mg/dL (H)). Liver Function Tests: Recent Labs  Lab 06/30/21 1830 07/01/21 0858 07/02/21 0554 07/03/21 0525  AST 17 12* 28 14*  ALT 7 7 13 7   ALKPHOS 132* 113 118 102  BILITOT 0.8 0.9 1.9* 0.5  PROT 6.5 5.8* 5.9* 5.5*  ALBUMIN 1.6* 1.4* 3.1* 1.4*   No results for input(s): LIPASE, AMYLASE in the last 168 hours. No results for input(s): AMMONIA in the last 168 hours. Coagulation Profile: Recent Labs  Lab 06/30/21 1830 07/01/21 0606  INR 1.3* 1.4*   Cardiac Enzymes: No results for input(s): CKTOTAL, CKMB, CKMBINDEX, TROPONINI in the last 168 hours. BNP (last 3 results) No results for input(s): PROBNP in the last 8760 hours. HbA1C: No results for input(s): HGBA1C in the last 72 hours. CBG: No results for input(s): GLUCAP in the last 168 hours. Lipid Profile: No results for input(s): CHOL, HDL, LDLCALC, TRIG, CHOLHDL, LDLDIRECT in the last 72 hours. Thyroid Function Tests: No results for input(s): TSH, T4TOTAL, FREET4, T3FREE, THYROIDAB in the last 72 hours. Anemia Panel: No results for input(s): VITAMINB12, FOLATE, FERRITIN, TIBC, IRON, RETICCTPCT in the last 72 hours. Sepsis Labs: Recent Labs  Lab 06/30/21 1830 06/30/21 2204 07/01/21 0606  PROCALCITON  --  7.06 8.39  LATICACIDVEN 3.5* 2.0*  --     Recent Results (from the past 240 hour(s))  Resp Panel by RT-PCR (Flu A&B, Covid) Nasopharyngeal Swab     Status: None   Collection Time: 06/30/21  6:30 PM   Specimen: Nasopharyngeal Swab; Nasopharyngeal(NP) swabs in vial transport medium  Result Value Ref Range Status   SARS Coronavirus 2 by RT PCR NEGATIVE NEGATIVE Final    Comment: (NOTE) SARS-CoV-2 target nucleic acids are NOT DETECTED.  The SARS-CoV-2 RNA is generally detectable in upper respiratory specimens during the acute phase of infection. The lowest concentration of  SARS-CoV-2 viral copies this assay can detect is 138 copies/mL. A negative result does not preclude SARS-Cov-2 infection and should not be used as the sole basis for treatment or other patient management decisions. A negative result may occur with  improper specimen collection/handling, submission of specimen other than nasopharyngeal swab, presence of viral mutation(s) within the areas targeted by this assay, and inadequate number of viral copies(<138 copies/mL). A negative result must be combined with clinical observations, patient history, and epidemiological information. The expected result is Negative.  Fact Sheet for Patients:  EntrepreneurPulse.com.au  Fact Sheet for Healthcare Providers:  IncredibleEmployment.be  This test is no t yet approved or cleared by the [place]  States FDA and  has been authorized for detection and/or diagnosis of SARS-CoV-2 by FDA under an Emergency Use Authorization (EUA). This EUA will remain  in effect (meaning this test can be used) for the duration of the COVID-19 declaration under Section 564(b)(1) of the Act, 21 U.S.C.section 360bbb-3(b)(1), unless the authorization is terminated  or revoked sooner.       Influenza A by PCR NEGATIVE NEGATIVE Final   Influenza B by PCR NEGATIVE NEGATIVE Final    Comment: (NOTE) The Xpert Xpress SARS-CoV-2/FLU/RSV plus assay is intended as an aid in the diagnosis of influenza from Nasopharyngeal swab specimens and should not be used as a sole basis for treatment. Nasal washings and aspirates are unacceptable for Xpert Xpress SARS-CoV-2/FLU/RSV testing.  Fact Sheet for Patients: EntrepreneurPulse.com.au  Fact Sheet for Healthcare Providers: IncredibleEmployment.be  This test is not yet approved or cleared by the [place] FDA and has been authorized for detection and/or diagnosis of SARS-CoV-2 by FDA under an Emergency Use  Authorization (EUA). This EUA will remain in effect (meaning this test can be used) for the duration of the COVID-19 declaration under Section 564(b)(1) of the Act, 21 U.S.C. section 360bbb-3(b)(1), unless the authorization is terminated or revoked.  Performed at [facility], [address], [place], [place] [address]   Blood Culture (routine x 2)     Status: Abnormal (Preliminary result)   Collection Time: 06/30/21  6:30 PM   Specimen: BLOOD  Result Value Ref Range Status   Specimen Description   Final    BLOOD RIGHT ANTECUBITAL Performed at [facility], [address] [place], [place] [address]    Special Requests   Final    BOTTLES DRAWN AEROBIC AND ANAEROBIC Blood Culture results may not be optimal due to an inadequate volume of blood received in culture bottles Performed at [facility], [address] [place], [place] [address]    Culture  Setup Time   Final    GRAM NEGATIVE RODS IN BOTH AEROBIC AND ANAEROBIC BOTTLES CRITICAL RESULT CALLED TO, READ BACK BY AND VERIFIED WITH: ALEX CHAPPELL AT [place] 07/01/21.PMF    Culture (A)  Final    CITROBACTER KOSERI SUSCEPTIBILITIES TO FOLLOW Performed at [facility], [place] [address], [place], [place] [address]    Report Status PENDING  Incomplete  Blood Culture ID Panel (Reflexed)     Status: Abnormal   Collection Time: 06/30/21  6:30 PM  Result Value Ref Range Status   Enterococcus faecalis NOT DETECTED NOT DETECTED Final   Enterococcus Faecium NOT DETECTED NOT DETECTED Final   Listeria monocytogenes NOT DETECTED NOT DETECTED Final   Staphylococcus species NOT DETECTED NOT DETECTED Final   Staphylococcus aureus (BCID) NOT DETECTED NOT DETECTED Final   Staphylococcus epidermidis NOT DETECTED NOT DETECTED Final   Staphylococcus lugdunensis NOT DETECTED NOT DETECTED Final   Streptococcus species NOT DETECTED NOT DETECTED Final   Streptococcus agalactiae NOT DETECTED NOT DETECTED Final    Streptococcus pneumoniae NOT DETECTED NOT DETECTED Final   Streptococcus pyogenes NOT DETECTED NOT DETECTED Final   A.calcoaceticus-baumannii NOT DETECTED NOT DETECTED Final   Bacteroides fragilis NOT DETECTED NOT DETECTED Final   Enterobacterales DETECTED (A) NOT DETECTED Final    Comment: Enterobacterales represent a large order of gram negative bacteria, not a single organism. Refer to culture for further identification. CRITICAL RESULT CALLED TO, READ BACK BY AND VERIFIED WITH: ALEX CHAPPELL AT [place] 07/01/21.PMF    Enterobacter cloacae complex NOT DETECTED NOT DETECTED  Final   Escherichia coli NOT DETECTED NOT DETECTED Final   Klebsiella aerogenes NOT DETECTED NOT DETECTED Final   Klebsiella oxytoca NOT DETECTED NOT DETECTED Final   Klebsiella pneumoniae NOT DETECTED NOT DETECTED Final   Proteus species NOT DETECTED NOT DETECTED Final   Salmonella species NOT DETECTED NOT DETECTED Final   Serratia marcescens NOT DETECTED NOT DETECTED Final   Haemophilus influenzae NOT DETECTED NOT DETECTED Final   Neisseria meningitidis NOT DETECTED NOT DETECTED Final   Pseudomonas aeruginosa NOT DETECTED NOT DETECTED Final   Stenotrophomonas maltophilia NOT DETECTED NOT DETECTED Final   Candida albicans NOT DETECTED NOT DETECTED Final   Candida auris NOT DETECTED NOT DETECTED Final   Candida glabrata NOT DETECTED NOT DETECTED Final   Candida krusei NOT DETECTED NOT DETECTED Final   Candida parapsilosis NOT DETECTED NOT DETECTED Final   Candida tropicalis NOT DETECTED NOT DETECTED Final   Cryptococcus neoformans/gattii NOT DETECTED NOT DETECTED Final   CTX-M ESBL NOT DETECTED NOT DETECTED Final   Carbapenem resistance IMP NOT DETECTED NOT DETECTED Final   Carbapenem resistance KPC NOT DETECTED NOT DETECTED Final   Carbapenem resistance NDM NOT DETECTED NOT DETECTED Final   Carbapenem resist OXA 48 LIKE NOT DETECTED NOT DETECTED Final   Carbapenem resistance VIM NOT DETECTED NOT DETECTED Final     Comment: Performed at [facility], [address] [place], [place] [address]  Blood Culture (routine x 2)     Status: Abnormal (Preliminary result)   Collection Time: 06/30/21  6:35 PM   Specimen: BLOOD  Result Value Ref Range Status   Specimen Description   Final    BLOOD LEFT ANTECUBITAL Performed at [facility], [address] [place], [place] [address]    Special Requests   Final    BOTTLES DRAWN AEROBIC AND ANAEROBIC Blood Culture adequate volume Performed at [facility], [address] [place], [place] [address]    Culture  Setup Time   Final    GRAM NEGATIVE RODS IN BOTH AEROBIC AND ANAEROBIC BOTTLES CRITICAL VALUE NOTED.  VALUE IS CONSISTENT WITH PREVIOUSLY REPORTED AND CALLED VALUE. Performed at [facility], [place] [address], [place], [place] [address]    Culture CITROBACTER KOSERI (A)  Final   Report Status PENDING  Incomplete  Urine Culture     Status: Abnormal   Collection Time: 06/30/21  8:00 PM   Specimen: In/Out Cath Urine  Result Value Ref Range Status   Specimen Description   Final    IN/OUT CATH URINE Performed at [facility], [address] [place], [place] [address]    Special Requests   Final    NONE Performed at [facility], [address] [place],  [address]    Culture MULTIPLE SPECIES PRESENT, SUGGEST RECOLLECTION (A)  Final   Report Status 07/02/2021 FINAL  Final         Radiology Studies: No results found.      Scheduled Meds:  enoxaparin (LOVENOX) injection  0.5 mg/kg Subcutaneous Q24H   midodrine  10 mg Oral TID WC   pantoprazole  40 mg Oral BID AC   PARoxetine  30 mg Oral Daily   Continuous Infusions:  sodium chloride 75 mL/hr at 07/03/21 0359   piperacillin-tazobactam (ZOSYN)  IV 3.375 g (07/03/21 0411)     LOS: 3 days    Time spent: 30 mins     Wyvonnia Dusky, MD [facility] Pager [phone]  If 7PM-7AM, please  contact  night-coverage 07/03/2021, 8:17 AM

## 2021-07-04 DIAGNOSIS — R7881 Bacteremia: Secondary | ICD-10-CM | POA: Diagnosis not present

## 2021-07-04 DIAGNOSIS — D638 Anemia in other chronic diseases classified elsewhere: Secondary | ICD-10-CM

## 2021-07-04 LAB — CBC
HCT: 22.1 % — ABNORMAL LOW (ref 36.0–46.0)
Hemoglobin: 7 g/dL — ABNORMAL LOW (ref 12.0–15.0)
MCH: 27.2 pg (ref 26.0–34.0)
MCHC: 31.7 g/dL (ref 30.0–36.0)
MCV: 86 fL (ref 80.0–100.0)
Platelets: 239 10*3/uL (ref 150–400)
RBC: 2.57 MIL/uL — ABNORMAL LOW (ref 3.87–5.11)
RDW: 17.3 % — ABNORMAL HIGH (ref 11.5–15.5)
WBC: 14.6 10*3/uL — ABNORMAL HIGH (ref 4.0–10.5)
nRBC: 0 % (ref 0.0–0.2)

## 2021-07-04 LAB — HEMOGLOBIN AND HEMATOCRIT, BLOOD
HCT: 24.6 % — ABNORMAL LOW (ref 36.0–46.0)
Hemoglobin: 7.8 g/dL — ABNORMAL LOW (ref 12.0–15.0)

## 2021-07-04 LAB — MAGNESIUM: Magnesium: 1.7 mg/dL (ref 1.7–2.4)

## 2021-07-04 LAB — ABO/RH: ABO/RH(D): B POS

## 2021-07-04 LAB — COMPREHENSIVE METABOLIC PANEL
ALT: 7 U/L (ref 0–44)
AST: 12 U/L — ABNORMAL LOW (ref 15–41)
Albumin: 1.3 g/dL — ABNORMAL LOW (ref 3.5–5.0)
Alkaline Phosphatase: 98 U/L (ref 38–126)
Anion gap: 5 (ref 5–15)
BUN: 18 mg/dL (ref 8–23)
CO2: 26 mmol/L (ref 22–32)
Calcium: 7.2 mg/dL — ABNORMAL LOW (ref 8.9–10.3)
Chloride: 105 mmol/L (ref 98–111)
Creatinine, Ser: 1.12 mg/dL — ABNORMAL HIGH (ref 0.44–1.00)
GFR, Estimated: 52 mL/min — ABNORMAL LOW (ref >60)
Glucose, Bld: 82 mg/dL (ref 70–99)
Potassium: 3.4 mmol/L — ABNORMAL LOW (ref 3.5–5.1)
Sodium: 136 mmol/L (ref 135–145)
Total Bilirubin: 0.4 mg/dL (ref 0.3–1.2)
Total Protein: 5.4 g/dL — ABNORMAL LOW (ref 6.5–8.1)

## 2021-07-04 LAB — URINE CULTURE: Culture: <10000 — AB

## 2021-07-04 LAB — PREPARE RBC (CROSSMATCH)

## 2021-07-04 LAB — PHOSPHORUS: Phosphorus: 2.3 mg/dL — ABNORMAL LOW (ref 2.5–4.6)

## 2021-07-04 MED ORDER — SODIUM CHLORIDE 0.9% IV SOLUTION: Freq: Once | INTRAVENOUS | Status: AC

## 2021-07-04 MED ORDER — SODIUM CHLORIDE 0.9 % IV BOLUS
1000.0000 mL | Freq: Once | INTRAVENOUS | Status: AC
  Administered 2021-07-04: 1000 mL via INTRAVENOUS

## 2021-07-04 NOTE — Progress Notes (Signed)
PROGRESS NOTE   Hospital course from Dr. Tobie Poet: Kayla Long is a 75 y.o. female with medical history significant for morbid obesity, history of hypertension, bilateral lymphedema, chronically bedbound, presents the emergency department for chief concerns of chills and lethargy.   At bedside patient is able to tell me her name, her age, the current calendar year, and her current location.  She appears to be awake and alert and oriented.  She reports that she is presenting to the emergency department for bilateral lower abdominal pain that started on day of presentation.  She denies headaches, shortness of breath, dysuria, diarrhea that she knows of.  She denies loss of consciousness.   She endorses burning in her chest after eating and reports this has been ongoing for two weeks.    Per daughter at bedside patient's blood pressure normally runs in the 33I systolic.  Hospital course from Dr. Jimmye Norman 8/13-8/16/22: Pt presented w/ chills and lethargy likely secondary to bacteremia & possible. Blood cxs grew citrobater. Initial urine cx grew multiple species and the second urine cx shows <10,000 colonies. Pt is on IV zosyn and can be switch to po (quinolone) at d/c as per ID. The source of bacteremia is unclear, possibly the skin. Repeat blood cxs are NGTD. Of note, pt's BP has been low to low normal despite IVFs, midodrine. Hb today is 7.0, so will give 1 unit of pRBCs today.     REMIE MATHISON  RJJ:[id number] DOB: 11-Mar-1946 DOA: 06/30/2021 PCP: No primary care provider on file.  Assessment & Plan:   Principal Problem:   Sepsis secondary to UTI [facility]) Active Problems:   Sepsis associated hypotension ([place])   History of hypertension   Lymphedema of upper extremity, bilateral   Morbid obesity with BMI of 50.0-59.9, adult (HCC)   IBS (irritable bowel syndrome)   Sepsis ([place])   Sepsis: met criteria w/ tachycardia, tachypnea, leukocytosis, elevated lactic acid & likely UTI & bacteremia.  Procal 8.39. Continue on IVF. Continue on IV zosyn. Resolved   Bacteremia: blood cxs growing citrobacter koseri, sens pending. Repeat blood cxs NGTD. Continue on IV zosyn. Source is unknown, possibly the skin. Bedbound. ID following and recs apprec  UTI: UA is positive. Repeat urine cx shows insignificant growth. Continue on IV zosyn   Sacral decubitus ulcer: unstageable. Continue w/ wound care   Leukocytosis: secondary to infection. Trending down. Continue on IV abxs   ACD: Hb is 7.0 & BP is low to low normal. Will give 1 unit of pRBCs today. Repeat H&H 4 hours post transfusion  Likely CKD: baseline Cr is unknown, currently stage IIIa.Cr is labile    Burning chest discomfort: likely secondary to GERD. Resolved    Hypokalemia: WNL today   HTN: will continue to hold home dose of amlodipine secondary to low to low normal BP    Depression: severity unknown. Continue on home dose of citalopram   Morbid obesity: BMI 69.8. Complicates overall care & prognosis   Severe Lymphedema: of b/l LE. Continue w/ supportive care   DVT prophylaxis: lovenox  Code Status: full  Family Communication: discussed pt's care w/ pt's daughter, [name], and answered her questions  Disposition Plan: d/c back home   Level of care: PCU  Status is: Inpatient  Remains inpatient appropriate because:Ongoing diagnostic testing needed not appropriate for outpatient work up, Unsafe d/c plan, IV treatments appropriate due to intensity of illness or inability to take PO, and Inpatient level of care appropriate due to severity of illness,  will get 1 unit of pRBCs today as BP is low to low normal   Dispo: The patient is from: Home              Anticipated d/c is to:  home w/ family care              Patient currently is not medically stable to d/c.   Difficult to place patient : unclear   Consultants:    Procedures:  Antimicrobials: zosyn   Subjective: Pt c/o malaise   Objective: Vitals:   07/03/21 1656  07/03/21 2012 07/04/21 0310 07/04/21 0744  BP: (!) 102/46 (!) 83/40 (!) 95/45 (!) 95/35  Pulse: 79 85 83 82  Resp: _0 Temp: 97.8 F (36.6 C) (!) 97.4 F (36.3 C) 98.4 F (36.9 C)   TempSrc: Oral     SpO2: 100% 100% 99% 100%  Weight:      Height:        Intake/Output Summary (Last 24 hours) at 07/04/2021 0803 Last data filed at 07/04/2021 0743 Gross per 24 hour  Intake 1080 ml  Output 500 ml  Net 580 ml   Filed Weights   06/30/21 1821 07/01/21 1628  Weight: (!) 150 kg (!) 196.3 kg    Examination:  General exam: Appears uncomfortable Respiratory system: decreased breath sounds b/l Cardiovascular system: S1/S2+. No rubs or clicks  Gastrointestinal system: Abd is soft, NT, ND & hypoactive bowel sounds  Central nervous system: Alert and oriented  Extremities: severe lymphedema of b/l LE w/ dry, tough & thickened skin of b/l LE   Psychiatry: Judgement and insight appear normal. Flat mood and affect    Data Reviewed: I have personally reviewed following labs and imaging studies  CBC: Recent Labs  Lab 06/30/21 1830 07/01/21 0858 07/02/21 0625 07/03/21 0525 07/04/21 0532  WBC 26.9* 26.7* 16.9* 17.0* 14.6*  NEUTROABS 24.3*  --   --   --   --   HGB 7.8* 7.2* 7.5* 7.3* 7.0*  HCT 24.6* 22.2* 23.3* 22.8* 22.1*  MCV 90.4 87.1 87.6 86.7 86.0  PLT 302 292 279 261 156   Basic Metabolic Panel: Recent Labs  Lab 06/30/21 1830 07/01/21 0858 07/02/21 0554 07/03/21 0525 07/04/21 0532  NA 134* 135 133* 137 136  K 2.6* 2.9* 3.0* 3.6 3.4*  CL 100 103 97* 104 105  CO2 _1 GLUCOSE 100* 115* 102* 87 82  BUN 14 14 42* 16 18  CREATININE 1.10* 1.05* 0.85 1.06* 1.12*  CALCIUM 7.3* 7.3* 8.5* 7.4* 7.2*  MG 1.9  --  1.8 1.7 1.7  PHOS  --   --  2.5 2.5 2.3*   GFR: Estimated Creatinine Clearance: 81.8 mL/min (A) (by C-G formula based on SCr of 1.12 mg/dL (H)). Liver Function Tests: Recent Labs  Lab 06/30/21 1830 07/01/21 0858 07/02/21 0554 07/03/21 0525  07/04/21 0532  AST 17 12* 28 14* 12*  ALT _2 ALKPHOS 132* 113 118 102 98  BILITOT 0.8 0.9 1.9* 0.5 0.4  PROT 6.5 5.8* 5.9* 5.5* 5.4*  ALBUMIN 1.6* 1.4* 3.1* 1.4* 1.3*   No results for input(s): LIPASE, AMYLASE in the last 168 hours. No results for input(s): AMMONIA in the last 168 hours. Coagulation Profile: Recent Labs  Lab 06/30/21 1830 07/01/21 0606  INR 1.3* 1.4*   Cardiac Enzymes: No results for input(s): CKTOTAL, CKMB, CKMBINDEX, TROPONINI in the last 168 hours. BNP (last 3 results) No results  for input(s): PROBNP in the last 8760 hours. HbA1C: No results for input(s): HGBA1C in the last 72 hours. CBG: No results for input(s): GLUCAP in the last 168 hours. Lipid Profile: No results for input(s): CHOL, HDL, LDLCALC, TRIG, CHOLHDL, LDLDIRECT in the last 72 hours. Thyroid Function Tests: No results for input(s): TSH, T4TOTAL, FREET4, T3FREE, THYROIDAB in the last 72 hours. Anemia Panel: No results for input(s): VITAMINB12, FOLATE, FERRITIN, TIBC, IRON, RETICCTPCT in the last 72 hours. Sepsis Labs: Recent Labs  Lab 06/30/21 1830 06/30/21 2204 07/01/21 0606  PROCALCITON  --  7.06 8.39  LATICACIDVEN 3.5* 2.0*  --     Recent Results (from the past 240 hour(s))  Resp Panel by RT-PCR (Flu A&B, Covid) Nasopharyngeal Swab     Status: None   Collection Time: 06/30/21  6:30 PM   Specimen: Nasopharyngeal Swab; Nasopharyngeal(NP) swabs in vial transport medium  Result Value Ref Range Status   SARS Coronavirus 2 by RT PCR NEGATIVE NEGATIVE Final    Comment: (NOTE) SARS-CoV-2 target nucleic acids are NOT DETECTED.  The SARS-CoV-2 RNA is generally detectable in upper respiratory specimens during the acute phase of infection. The lowest concentration of SARS-CoV-2 viral copies this assay can detect is 138 copies/mL. A negative result does not preclude SARS-Cov-2 infection and should not be used as the sole basis for treatment or other patient management decisions.  A negative result may occur with  improper specimen collection/handling, submission of specimen other than nasopharyngeal swab, presence of viral mutation(s) within the areas targeted by this assay, and inadequate number of viral copies(<138 copies/mL). A negative result must be combined with clinical observations, patient history, and epidemiological information. The expected result is Negative.  Fact Sheet for Patients:  EntrepreneurPulse.com.au  Fact Sheet for Healthcare Providers:  IncredibleEmployment.be  This test is no t yet approved or cleared by the [place] FDA and  has been authorized for detection and/or diagnosis of SARS-CoV-2 by FDA under an Emergency Use Authorization (EUA). This EUA will remain  in effect (meaning this test can be used) for the duration of the COVID-19 declaration under Section 564(b)(1) of the Act, 21 U.S.C.section 360bbb-3(b)(1), unless the authorization is terminated  or revoked sooner.       Influenza A by PCR NEGATIVE NEGATIVE Final   Influenza B by PCR NEGATIVE NEGATIVE Final    Comment: (NOTE) The Xpert Xpress SARS-CoV-2/FLU/RSV plus assay is intended as an aid in the diagnosis of influenza from Nasopharyngeal swab specimens and should not be used as a sole basis for treatment. Nasal washings and aspirates are unacceptable for Xpert Xpress SARS-CoV-2/FLU/RSV testing.  Fact Sheet for Patients: EntrepreneurPulse.com.au  Fact Sheet for Healthcare Providers: IncredibleEmployment.be  This test is not yet approved or cleared by the [place] FDA and has been authorized for detection and/or diagnosis of SARS-CoV-2 by FDA under an Emergency Use Authorization (EUA). This EUA will remain in effect (meaning this test can be used) for the duration of the COVID-19 declaration under Section 564(b)(1) of the Act, 21 U.S.C. section 360bbb-3(b)(1), unless the authorization  is terminated or revoked.  Performed at [facility], [address] [place], [place] [address]   Blood Culture (routine x 2)     Status: Abnormal   Collection Time: 06/30/21  6:30 PM   Specimen: BLOOD  Result Value Ref Range Status   Specimen Description   Final    BLOOD RIGHT ANTECUBITAL Performed at [facility], [address] [place], [place] [address]  Special Requests   Final    BOTTLES DRAWN AEROBIC AND ANAEROBIC Blood Culture results may not be optimal due to an inadequate volume of blood received in culture bottles Performed at [facility], [address] [place], [place] [address]    Culture  Setup Time   Final    GRAM NEGATIVE RODS IN BOTH AEROBIC AND ANAEROBIC BOTTLES CRITICAL RESULT CALLED TO, READ BACK BY AND VERIFIED WITH: ALEX CHAPPELL AT [place] 07/01/21.PMF Performed at [facility], [place] [address] [place], [place] [address]    Culture CITROBACTER KOSERI (A)  Final   Report Status 07/03/2021 FINAL  Final   Organism ID, Bacteria CITROBACTER KOSERI  Final      Susceptibility   Citrobacter koseri - MIC*    CEFAZOLIN <=4 SENSITIVE Sensitive     CEFEPIME <=0.12 SENSITIVE Sensitive     CEFTAZIDIME <=1 SENSITIVE Sensitive     CEFTRIAXONE <=0.25 SENSITIVE Sensitive     CIPROFLOXACIN <=0.25 SENSITIVE Sensitive     GENTAMICIN <=1 SENSITIVE Sensitive     IMIPENEM <=0.25 SENSITIVE Sensitive     TRIMETH/SULFA <=20 SENSITIVE Sensitive     PIP/TAZO <=4 SENSITIVE Sensitive     * CITROBACTER KOSERI  Blood Culture ID Panel (Reflexed)     Status: Abnormal   Collection Time: 06/30/21  6:30 PM  Result Value Ref Range Status   Enterococcus faecalis NOT DETECTED NOT DETECTED Final   Enterococcus Faecium NOT DETECTED NOT DETECTED Final   Listeria monocytogenes NOT DETECTED NOT DETECTED Final   Staphylococcus species NOT DETECTED NOT DETECTED Final   Staphylococcus aureus (BCID) NOT DETECTED NOT DETECTED Final   Staphylococcus  epidermidis NOT DETECTED NOT DETECTED Final   Staphylococcus lugdunensis NOT DETECTED NOT DETECTED Final   Streptococcus species NOT DETECTED NOT DETECTED Final   Streptococcus agalactiae NOT DETECTED NOT DETECTED Final   Streptococcus pneumoniae NOT DETECTED NOT DETECTED Final   Streptococcus pyogenes NOT DETECTED NOT DETECTED Final   A.calcoaceticus-baumannii NOT DETECTED NOT DETECTED Final   Bacteroides fragilis NOT DETECTED NOT DETECTED Final   Enterobacterales DETECTED (A) NOT DETECTED Final    Comment: Enterobacterales represent a large order of gram negative bacteria, not a single organism. Refer to culture for further identification. CRITICAL RESULT CALLED TO, READ BACK BY AND VERIFIED WITH: ALEX CHAPPELL AT [place] 07/01/21.PMF    Enterobacter cloacae complex NOT DETECTED NOT DETECTED Final   Escherichia coli NOT DETECTED NOT DETECTED Final   Klebsiella aerogenes NOT DETECTED NOT DETECTED Final   Klebsiella oxytoca NOT DETECTED NOT DETECTED Final   Klebsiella pneumoniae NOT DETECTED NOT DETECTED Final   Proteus species NOT DETECTED NOT DETECTED Final   Salmonella species NOT DETECTED NOT DETECTED Final   Serratia marcescens NOT DETECTED NOT DETECTED Final   Haemophilus influenzae NOT DETECTED NOT DETECTED Final   Neisseria meningitidis NOT DETECTED NOT DETECTED Final   Pseudomonas aeruginosa NOT DETECTED NOT DETECTED Final   Stenotrophomonas maltophilia NOT DETECTED NOT DETECTED Final   Candida albicans NOT DETECTED NOT DETECTED Final   Candida auris NOT DETECTED NOT DETECTED Final   Candida glabrata NOT DETECTED NOT DETECTED Final   Candida krusei NOT DETECTED NOT DETECTED Final   Candida parapsilosis NOT DETECTED NOT DETECTED Final   Candida tropicalis NOT DETECTED NOT DETECTED Final   Cryptococcus neoformans/gattii NOT DETECTED NOT DETECTED Final   CTX-M ESBL NOT DETECTED NOT DETECTED Final   Carbapenem resistance IMP NOT DETECTED NOT DETECTED Final   Carbapenem resistance  KPC NOT DETECTED NOT  DETECTED Final   Carbapenem resistance NDM NOT DETECTED NOT DETECTED Final   Carbapenem resist OXA 48 LIKE NOT DETECTED NOT DETECTED Final   Carbapenem resistance VIM NOT DETECTED NOT DETECTED Final    Comment: Performed at [facility], [address] [place], [place] [address]  Blood Culture (routine x 2)     Status: Abnormal   Collection Time: 06/30/21  6:35 PM   Specimen: BLOOD  Result Value Ref Range Status   Specimen Description   Final    BLOOD LEFT ANTECUBITAL Performed at [facility], [address], [place], [place] [address]    Special Requests   Final    BOTTLES DRAWN AEROBIC AND ANAEROBIC Blood Culture adequate volume Performed at [facility], [address], [place], [place] [address]    Culture  Setup Time   Final    GRAM NEGATIVE RODS IN BOTH AEROBIC AND ANAEROBIC BOTTLES CRITICAL VALUE NOTED.  VALUE IS CONSISTENT WITH PREVIOUSLY REPORTED AND CALLED VALUE.    Culture (A)  Final    CITROBACTER KOSERI SUSCEPTIBILITIES PERFORMED ON PREVIOUS CULTURE WITHIN THE LAST 5 DAYS. Performed at [facility], [place] [address], [place], [place] [address]    Report Status 07/03/2021 FINAL  Final  Urine Culture     Status: Abnormal   Collection Time: 06/30/21  8:00 PM   Specimen: In/Out Cath Urine  Result Value Ref Range Status   Specimen Description   Final    IN/OUT CATH URINE Performed at [facility], [address] [place], [place] [address]    Special Requests   Final    NONE Performed at [facility], [address] [place], [place] [address]    Culture MULTIPLE SPECIES PRESENT, SUGGEST RECOLLECTION (A)  Final   Report Status 07/02/2021 FINAL  Final  CULTURE, BLOOD (ROUTINE X 2) w Reflex to ID Panel     Status: None (Preliminary result)   Collection Time: 07/03/21  9:53 AM   Specimen: BLOOD  Result Value Ref Range Status   Specimen Description BLOOD  LT [facility]  Final   Special Requests    Final    BOTTLES DRAWN AEROBIC AND ANAEROBIC Blood Culture adequate volume   Culture   Final    NO GROWTH < 24 HOURS Performed at [facility], [address], [place], [place] [address]    Report Status PENDING  Incomplete  CULTURE, BLOOD (ROUTINE X 2) w Reflex to ID Panel     Status: None (Preliminary result)   Collection Time: 07/03/21 10:05 AM   Specimen: BLOOD  Result Value Ref Range Status   Specimen Description BLOOD  LT HAND  Final   Special Requests   Final    BOTTLES DRAWN AEROBIC AND ANAEROBIC Blood Culture results may not be optimal due to an inadequate volume of blood received in culture bottles   Culture   Final    NO GROWTH < 24 HOURS Performed at [facility], [address], [place],  [address]    Report Status PENDING  Incomplete         Radiology Studies: No results found.      Scheduled Meds:  enoxaparin (LOVENOX) injection  0.5 mg/kg Subcutaneous Q24H   feeding supplement  237 mL Oral TID   midodrine  10 mg Oral TID WC   pantoprazole  40 mg Oral BID AC   PARoxetine  30 mg Oral Daily   Continuous Infusions:  sodium chloride 75  mL/hr at 07/03/21 0359   piperacillin-tazobactam (ZOSYN)  IV 3.375 g (07/04/21 0409)     LOS: 4 days    Time spent: 33 mins     Wyvonnia Dusky, MD [facility] Pager [phone]  If 7PM-7AM, please contact night-coverage 07/04/2021, 8:03 AM

## 2021-07-04 NOTE — Progress Notes (Signed)
ID Patient doing well No fever No problem with micturition Daughter at bedside   O/E awake and alert Patient Vitals for the past 24 hrs:  BP Temp Temp src Pulse Resp SpO2  07/04/21 1700 (!) 94/35 (!) 97.5 F (36.4 C) Oral 72 20 100 %  07/04/21 1700 (!) 94/35 (!) 97.5 F (36.4 C) -- 72 20 --  07/04/21 1631 (!) 94/36 97.7 F (36.5 C) Oral 80 18 96 %  07/04/21 1519 (!) 101/41 98.1 F (36.7 C) -- 79 16 100 %  07/04/21 1140 (!) 86/49 -- -- 84 -- --  07/04/21 1137 (!) 86/39 98 F (36.7 C) -- 83 16 100 %  07/04/21 0744 (!) 95/35 -- -- 82 16 100 %  07/04/21 0310 (!) 95/45 98.4 F (36.9 C) -- 83 16 99 %  Chest bilateral air entry Heart sound S1-S2 Abdomen soft Bilateral lymphedema/lipedema of the leg with lichenification of the skin of the lower extremities.   Labs CBC Latest Ref Rng & Units 07/04/2021 07/03/2021 07/02/2021  WBC 4.0 - 10.5 K/uL 14.6(H) 17.0(H) 16.9(H)  Hemoglobin 12.0 - 15.0 g/dL 7.0(L) 7.3(L) 7.5(L)  Hematocrit 36.0 - 46.0 % 22.1(L) 22.8(L) 23.3(L)  Platelets 150 - 400 K/uL 239 261 279     CMP Latest Ref Rng & Units 07/04/2021 07/03/2021 07/02/2021  Glucose 70 - 99 mg/dL 82 87 102(H)  BUN 8 - 23 mg/dL 18 16 42(H)  Creatinine 0.44 - 1.00 mg/dL 1.12(H) 1.06(H) 0.85  Sodium 135 - 145 mmol/L 136 137 133(L)  Potassium 3.5 - 5.1 mmol/L 3.4(L) 3.6 3.0(L)  Chloride 98 - 111 mmol/L 105 104 97(L)  CO2 22 - 32 mmol/L '26 27 26  '$ Calcium 8.9 - 10.3 mg/dL 7.2(L) 7.4(L) 8.5(L)  Total Protein 6.5 - 8.1 g/dL 5.4(L) 5.5(L) 5.9(L)  Total Bilirubin 0.3 - 1.2 mg/dL 0.4 0.5 1.9(H)  Alkaline Phos 38 - 126 U/L 98 102 118  AST 15 - 41 U/L 12(L) 14(L) 28  ALT 0 - 44 U/L '7 7 13     '$ Micro 06/30/2021 blood culture Citrobacter koseri 07/03/2021 blood culture no growth  Impression/recommendation Bacteremia with Citrobacter koseri.  This is a GI pathogen. Could be causing UTI.  She had pyuria.  But urine culture was not collected correctly and has had multiple species. Highly susceptible  organism.  Currently on Zosyn. Will get ultrasound of the kidneys to look for any stones or hydronephrosis.  .  On discharge could be switched over to [place] until 07/09/2021.  Discussed the management with the daughter at bedside.

## 2021-07-05 ENCOUNTER — Inpatient Hospital Stay: Payer: Medicare HMO

## 2021-07-05 LAB — COMPREHENSIVE METABOLIC PANEL
ALT: 6 U/L (ref 0–44)
AST: 13 U/L — ABNORMAL LOW (ref 15–41)
Albumin: 1.5 g/dL — ABNORMAL LOW (ref 3.5–5.0)
Alkaline Phosphatase: 96 U/L (ref 38–126)
Anion gap: 8 (ref 5–15)
BUN: 18 mg/dL (ref 8–23)
CO2: 24 mmol/L (ref 22–32)
Calcium: 7 mg/dL — ABNORMAL LOW (ref 8.9–10.3)
Chloride: 104 mmol/L (ref 98–111)
Creatinine, Ser: 1.05 mg/dL — ABNORMAL HIGH (ref 0.44–1.00)
GFR, Estimated: 56 mL/min — ABNORMAL LOW (ref >60)
Glucose, Bld: 74 mg/dL (ref 70–99)
Potassium: 3.3 mmol/L — ABNORMAL LOW (ref 3.5–5.1)
Sodium: 136 mmol/L (ref 135–145)
Total Bilirubin: 0.6 mg/dL (ref 0.3–1.2)
Total Protein: 5.3 g/dL — ABNORMAL LOW (ref 6.5–8.1)

## 2021-07-05 LAB — TYPE AND SCREEN
ABO/RH(D): B POS
Antibody Screen: NEGATIVE
Unit division: 0

## 2021-07-05 LAB — PHOSPHORUS: Phosphorus: 2.3 mg/dL — ABNORMAL LOW (ref 2.5–4.6)

## 2021-07-05 LAB — FOLATE: Folate: 5.4 ng/mL — ABNORMAL LOW (ref >5.9)

## 2021-07-05 LAB — IRON AND TIBC: Iron: 37 ug/dL (ref 28–170)

## 2021-07-05 LAB — CBC
HCT: 23 % — ABNORMAL LOW (ref 36.0–46.0)
Hemoglobin: 7.4 g/dL — ABNORMAL LOW (ref 12.0–15.0)
MCH: 28 pg (ref 26.0–34.0)
MCHC: 32.2 g/dL (ref 30.0–36.0)
MCV: 87.1 fL (ref 80.0–100.0)
Platelets: 212 10*3/uL (ref 150–400)
RBC: 2.64 MIL/uL — ABNORMAL LOW (ref 3.87–5.11)
RDW: 16.7 % — ABNORMAL HIGH (ref 11.5–15.5)
WBC: 14.5 10*3/uL — ABNORMAL HIGH (ref 4.0–10.5)
nRBC: 0 % (ref 0.0–0.2)

## 2021-07-05 LAB — BPAM RBC
Blood Product Expiration Date: 202209022359
ISSUE DATE / TIME: 202208161637
Unit Type and Rh: 7300

## 2021-07-05 LAB — VITAMIN D 25 HYDROXY (VIT D DEFICIENCY, FRACTURES): Vit D, 25-Hydroxy: 11.4 ng/mL — ABNORMAL LOW (ref 30–100)

## 2021-07-05 LAB — VITAMIN B12: Vitamin B-12: 1189 pg/mL — ABNORMAL HIGH (ref 180–914)

## 2021-07-05 LAB — MAGNESIUM: Magnesium: 1.8 mg/dL (ref 1.7–2.4)

## 2021-07-05 MED ORDER — VITAMIN D (ERGOCALCIFEROL) 1.25 MG (50000 UNIT) PO CAPS
50000.0000 [IU] | ORAL_CAPSULE | ORAL | Status: DC
  Administered 2021-07-05: 50000 [IU] via ORAL
  Filled 2021-07-05: qty 1

## 2021-07-05 MED ORDER — POTASSIUM PHOSPHATES 15 MMOLE/5ML IV SOLN
30.0000 mmol | Freq: Once | INTRAVENOUS | Status: AC
  Administered 2021-07-05: 30 mmol via INTRAVENOUS
  Filled 2021-07-05: qty 10

## 2021-07-05 MED ORDER — CLOTRIMAZOLE-BETAMETHASONE 1-0.05 % EX CREA
TOPICAL_CREAM | Freq: Two times a day (BID) | CUTANEOUS | Status: DC
  Filled 2021-07-05 (×2): qty 15

## 2021-07-05 MED ORDER — AMMONIUM LACTATE 12 % EX LOTN
TOPICAL_LOTION | Freq: Two times a day (BID) | CUTANEOUS | Status: DC
  Filled 2021-07-05 (×2): qty 400

## 2021-07-05 NOTE — Progress Notes (Signed)
[facility] Progress Note  Patient: Kayla Long    EQA:[id number]  DOA: 06/30/2021     Date of Service: the patient was seen and examined on 07/05/2021  Chief Complaint  Patient presents with   Altered Mental Status   Brief hospital course: Kayla Long is a 75 y.o. female with medical history significant for morbid obesity, history of hypertension, bilateral lymphedema, chronically bedbound, presents the emergency department for chief concerns of chills and lethargy.   At bedside patient is able to tell me her name, her age, the current calendar year, and her current location.  She appears to be awake and alert and oriented.  She reports that she is presenting to the emergency department for bilateral lower abdominal pain that started on day of presentation.  She denies headaches, shortness of breath, dysuria, diarrhea that she knows of.  She denies loss of consciousness.   She endorses burning in her chest after eating and reports this has been ongoing for two weeks.    Per daughter at bedside patient's blood pressure normally runs in the 97L systolic.   Hospital course from Dr. Jimmye Norman 8/13-8/16/22: Pt presented w/ chills and lethargy likely secondary to bacteremia & possible. Blood cxs grew citrobater. Initial urine cx grew multiple species and the second urine cx shows <10,000 colonies. Pt is on IV zosyn and can be switch to po (quinolone) at d/c as per ID. The source of bacteremia is unclear, possibly the skin. Repeat blood cxs are NGTD. Of note, pt's BP has been low to low normal despite IVFs, midodrine. Hb 7.0, 1 unit of pRBCs given on 8/16.    Assessment and Plan: Principal Problem:   Sepsis secondary to UTI [facility]) Active Problems:   Sepsis associated hypotension ([place])   History of hypertension   Lymphedema of upper extremity, bilateral   Morbid obesity with BMI of 50.0-59.9, adult (HCC)   IBS (irritable bowel syndrome)   Sepsis ([place])     Sepsis: met criteria  w/ tachycardia, tachypnea, leukocytosis, elevated lactic acid & likely UTI & bacteremia. Procal 8.39. Continue on IVF. Continue on IV zosyn. Sepsis Resolved    Bacteremia: blood cxs growing citrobacter koseri, sens pending. Repeat blood cxs NGTD. Continue on IV zosyn. Source is unknown, possibly the skin. Bedbound. ID following and recs apprec Recommended Levaquin on discharge through 8/21  UTI: UA is positive. Repeat urine cx shows insignificant growth. Continue on IV zosyn  US renal no acute findings   sacral decubitus ulcer: unstageable. Continue w/ wound care    Leukocytosis: secondary to infection. Trending down. Continue on IV abxs    ACD: Hb is 7.0 & BP is low to low normal. Will give 1 unit of pRBCs today. 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 Resolved    Hypokalemia: WNL today   HTN: will continue to hold home dose of amlodipine secondary to low to low normal BP   Midodrine 10 mg p.o. 3 times daily was started on 8/12   Depression: severity unknown. Continue on home dose of citalopram    Morbid obesity: BMI 69.8. Complicates overall care & prognosis    Severe Lymphedema: of b/l LE, chronic skin changes Apply ammonium lactate twice daily and Lotrisone cream twice daily Continue w/ supportive care   Vitamin D deficiency, started vitamin D 50,000 units p.o. weekly  hypophosphatemia hypokalemia, repleted IV  Body mass index is 69.85 kg/m.  Interventions:       Diet: Heart healthy DVT Prophylaxis: Subcutaneous Lovenox   Advance goals of care discussion: Full code  Family Communication: family was present at bedside, at the time of interview.  The pt provided permission to discuss medical plan with the family. Opportunity was given to ask question and all questions were answered satisfactorily.   Disposition:  Pt is from home, admitted with  sepsis, UTI and bacteremia, gradually improving, still has some electrolyte abnormality, low blood pressure which precludes a safe discharge. Discharge to home, when 1 to 2 days.  Subjective: No overnight issues, patient has pain in the left hip which is chronic, denied any other active issues.  Patient is bedbound for the past 3 years, stays at home, being taken care of by family and home health.   Physical Exam: General:  alert, NAD.  Appear in mild distress, affect appropriate Eyes: PERRLA ENT: Oral Mucosa Clear, moist  Neck: no JVD,  Cardiovascular: S1 and S2 Present, no Murmur,  Respiratory: good respiratory effort, Bilateral Air entry equal and Decreased, no Crackles, no wheezes Abdomen: Bowel Sound present, Soft and no tenderness,  Skin: Bilateral lower extremity scaly and dry skin due to chronic lymphedema Extremities: Chronic lymphedema thickened scaly skin, dry feet Neurologic: without any new focal findings patient is bedbound for past 3 years secondary to severe bilateral lower extremity lymphedema Gait not checked due to patient safety concerns  Vitals:   07/05/21 0417 07/05/21 0801 07/05/21 1159 07/05/21 1528  BP: (!) 95/43 (!) 92/41 (!) 86/43 (!) 89/48  Pulse: 85 81 81 73  Resp: 17 17 16 17   Temp: 98.1 F (36.7 C) 98 F (36.7 C) 98 F (36.7 C) 98.1 F (36.7 C)  TempSrc: Oral     SpO2: 100% 100% 98% 97%  Weight:      Height:        Intake/Output Summary (Last 24 hours) at 07/05/2021 1646 Last data filed at 07/05/2021 1159 Gross per 24 hour  Intake 826.88 ml  Output --  Net 826.88 ml   Filed Weights   06/30/21 1821 07/01/21 1628  Weight: (!) 150 kg (!) 196.3 kg    Data Reviewed: I have personally reviewed and interpreted daily labs, tele strips, imagings as discussed above. I reviewed all nursing notes, pharmacy notes, vitals, pertinent old records I have discussed plan of care as described above with RN and patient/family.  CBC: Recent Labs  Lab  06/30/21 1830 07/01/21 0858 07/02/21 0625 07/03/21 0525 07/04/21 0532 07/04/21 2317 07/05/21 0622  WBC 26.9* 26.7* 16.9* 17.0* 14.6*  --  14.5*  NEUTROABS 24.3*  --   --   --   --   --   --   HGB 7.8* 7.2* 7.5* 7.3* 7.0* 7.8* 7.4*  HCT 24.6* 22.2* 23.3* 22.8* 22.1* 24.6* 23.0*  MCV 90.4 87.1 87.6 86.7 86.0  --  87.1  PLT 302 292 279 261 239  --  924   Basic Metabolic Panel: Recent Labs  Lab 06/30/21 1830 07/01/21 0858 07/02/21 0554 07/03/21 0525 07/04/21 0532 07/05/21 0622  NA 134* 135 133* 137 136 136  K 2.6* 2.9* 3.0* 3.6 3.4* 3.3*  CL 100 103 97* 104 105 104  CO2 24 25 26 27 26 24   GLUCOSE 100* 115* 102* 87 82 74  BUN 14 14 42* 16 18 18   CREATININE 1.10* 1.05* 0.85 1.06* 1.12* 1.05*  CALCIUM 7.3* 7.3* 8.5* 7.4* 7.2* 7.0*  MG 1.9  --  1.8 1.7 1.7 1.8  PHOS  --   --  2.5 2.5 2.3* 2.3*    Studies: US RENAL  Result Date: 07/05/2021 CLINICAL DATA:  Bacteremia. EXAM: RENAL / URINARY TRACT ULTRASOUND COMPLETE COMPARISON:  CTA abdomen pelvis dated August 27, 2008. FINDINGS: Right Kidney: Renal measurements: 9.3 x 4.9 x 5.5 cm = volume: 131 mL. Echogenicity within normal limits. No mass or hydronephrosis visualized. Left Kidney: Renal measurements: 11.0 x 9.2 x 4.9 cm = volume: 144 mL. Echogenicity within normal limits. No mass or hydronephrosis visualized. 4.5 cm cystic structure near the midpole likely represents an extrarenal pelvis when compared to prior CT. Bladder: Decompressed by Foley catheter. Other: None. IMPRESSION: 1. No acute abnormality. Electronically Signed   By: Titus Dubin M.D.   On: 07/05/2021 10:06    Scheduled Meds:  ammonium lactate   Topical BID   clotrimazole-betamethasone   Topical BID   enoxaparin (LOVENOX) injection  0.5 mg/kg Subcutaneous Q24H   feeding supplement  237 mL Oral TID   midodrine  10 mg Oral TID WC   pantoprazole  40 mg Oral BID AC   PARoxetine  30 mg Oral Daily   Continuous Infusions:  sodium chloride 75 mL/hr at 07/05/21 1316    piperacillin-tazobactam (ZOSYN)  IV 3.375 g (07/05/21 1310)   potassium PHOSPHATE IVPB (in mmol) 30 mmol (07/05/21 1314)   PRN Meds: acetaminophen **OR** acetaminophen, morphine injection, ondansetron **OR** ondansetron (ZOFRAN) IV, oxyCODONE-acetaminophen, phenol  Time spent: 35 minutes  Author: Val Riles. MD [facility] 07/05/2021 4:46 PM  To reach On-call, see care teams to locate the attending and reach out to them via [email or web address]. If 7PM-7AM, please contact night-coverage If you still have difficulty reaching the attending provider, please page the [facility] (Director on Call) for [facility] on amion for assistance.

## 2021-07-06 LAB — BASIC METABOLIC PANEL
Anion gap: 6 (ref 5–15)
BUN: 18 mg/dL (ref 8–23)
CO2: 26 mmol/L (ref 22–32)
Calcium: 7.1 mg/dL — ABNORMAL LOW (ref 8.9–10.3)
Chloride: 106 mmol/L (ref 98–111)
Creatinine, Ser: 1.16 mg/dL — ABNORMAL HIGH (ref 0.44–1.00)
GFR, Estimated: 50 mL/min — ABNORMAL LOW (ref >60)
Glucose, Bld: 75 mg/dL (ref 70–99)
Potassium: 3.3 mmol/L — ABNORMAL LOW (ref 3.5–5.1)
Sodium: 138 mmol/L (ref 135–145)

## 2021-07-06 LAB — CBC
HCT: 22.9 % — ABNORMAL LOW (ref 36.0–46.0)
Hemoglobin: 7.3 g/dL — ABNORMAL LOW (ref 12.0–15.0)
MCH: 27.8 pg (ref 26.0–34.0)
MCHC: 31.9 g/dL (ref 30.0–36.0)
MCV: 87.1 fL (ref 80.0–100.0)
Platelets: 211 10*3/uL (ref 150–400)
RBC: 2.63 MIL/uL — ABNORMAL LOW (ref 3.87–5.11)
RDW: 17.1 % — ABNORMAL HIGH (ref 11.5–15.5)
WBC: 14.3 10*3/uL — ABNORMAL HIGH (ref 4.0–10.5)
nRBC: 0 % (ref 0.0–0.2)

## 2021-07-06 LAB — MAGNESIUM: Magnesium: 1.8 mg/dL (ref 1.7–2.4)

## 2021-07-06 LAB — PHOSPHORUS: Phosphorus: 2.9 mg/dL (ref 2.5–4.6)

## 2021-07-06 MED ORDER — PANTOPRAZOLE SODIUM 40 MG PO TBEC
40.0000 mg | DELAYED_RELEASE_TABLET | Freq: Every day | ORAL | Status: DC
  Administered 2021-07-07 – 2021-07-11 (×5): 40 mg via ORAL
  Filled 2021-07-06 (×5): qty 1

## 2021-07-06 MED ORDER — POTASSIUM CHLORIDE CRYS ER 20 MEQ PO TBCR
40.0000 meq | EXTENDED_RELEASE_TABLET | Freq: Once | ORAL | Status: AC
  Administered 2021-07-06: 40 meq via ORAL
  Filled 2021-07-06: qty 2

## 2021-07-06 MED ORDER — SODIUM CHLORIDE 0.9 % IV SOLN: INTRAVENOUS | Status: AC

## 2021-07-06 MED ORDER — FOLIC ACID 1 MG PO TABS
1.0000 mg | ORAL_TABLET | Freq: Every day | ORAL | Status: DC
  Administered 2021-07-06 – 2021-07-11 (×6): 1 mg via ORAL
  Filled 2021-07-06 (×5): qty 1

## 2021-07-06 NOTE — Progress Notes (Signed)
   07/06/21 1200  Clinical Encounter Type  Visited With Patient;Family  Visit Type Initial;Spiritual support;Social support  Referral From Chaplain  Consult/Referral To [place] met a PT's family member who was upset concerning care. Chaplain gave space for her to voice her concerns. Chaplain provided compassionate presence, and reflective listening. Chaplain also redirected her to the PT's nurse to further discuss ways to reconcile the matter.

## 2021-07-06 NOTE — Progress Notes (Signed)
[facility] Progress Note  Patient: Kayla Long    HCW:[id number]  DOA: 06/30/2021     Date of Service: the patient was seen and examined on 07/06/2021  Chief Complaint  Patient presents with   Altered Mental Status   Brief hospital course: Kayla Long is a 75 y.o. female with medical history significant for morbid obesity, history of hypertension, bilateral lymphedema, chronically bedbound, presents the emergency department for chief concerns of chills and lethargy.   At bedside patient is able to tell me her name, her age, the current calendar year, and her current location.  She appears to be awake and alert and oriented.  She reports that she is presenting to the emergency department for bilateral lower abdominal pain that started on day of presentation.  She denies headaches, shortness of breath, dysuria, diarrhea that she knows of.  She denies loss of consciousness.   She endorses burning in her chest after eating and reports this has been ongoing for two weeks.    Per daughter at bedside patient's blood pressure normally runs in the 17O systolic.   Hospital course from Dr. Jimmye Norman 8/13-8/16/22: Pt presented w/ chills and lethargy likely secondary to bacteremia & possible. Blood cxs grew citrobater. Initial urine cx grew multiple species and the second urine cx shows <10,000 colonies. Pt is on IV zosyn and can be switch to po (quinolone) at d/c as per ID. The source of bacteremia is unclear, possibly the skin. Repeat blood cxs are NGTD. Of note, pt's BP has been low to low normal despite IVFs, midodrine. Hb 7.0, 1 unit of pRBCs given on 8/16.    Assessment and Plan: Principal Problem:   Sepsis secondary to UTI [facility]) Active Problems:   Sepsis associated hypotension ([place])   History of hypertension   Lymphedema of upper extremity, bilateral   Morbid obesity with BMI of 50.0-59.9, adult (HCC)   IBS (irritable bowel syndrome)   Sepsis ([place])     Sepsis: met criteria  w/ tachycardia, tachypnea, leukocytosis, elevated lactic acid & likely UTI & bacteremia. Procal 8.39.  Sepsis resolved but blood pressure still soft Continue gentle hydration Continue IV ceftriaxone Continue Midodrine 10 mg p.o. 3 times daily    Bacteremia: blood cxs growing citrobacter koseri, sens pending. Repeat blood cxs NGTD. Continue on IV zosyn. Source is unknown, possibly the skin. Bedbound. ID following and recs apprec Recommended Levaquin on discharge through 8/21  UTI: UA is positive. Repeat urine cx shows insignificant growth. Continue on IV zosyn  US renal no acute findings   sacral decubitus ulcer: unstageable. Continue w/ wound care and frequent turning every 2 hourly   Leukocytosis: secondary to infection. Trending down. Continue on IV abxs    ACD: Hb is 7.0 & BP is low to low normal. S/p 1 unit of pRBCs 8/18 Hb 7.3     Likely CKD: baseline Cr is unknown, currently stage IIIa.Cr is labile    Burning chest discomfort: likely secondary to GERD. Resolved    Hypokalemia: Potassium repleted   HTN: will continue to hold home dose of amlodipine secondary to low to low normal BP   Midodrine 10 mg p.o. 3 times daily was started on 8/12   Depression: severity unknown. Continue on home dose of citalopram    Morbid obesity: BMI 69.8. Complicates overall care & prognosis    Severe Lymphedema: of b/l LE, chronic skin changes Apply ammonium lactate twice daily and Lotrisone cream twice daily Continue w/ supportive care  Vitamin D deficiency, started vitamin D 50,000 units p.o. weekly  hypophosphatemia hypokalemia, repleted IV  Body mass index is 69.85 kg/m.  Interventions:       Diet: Heart healthy DVT Prophylaxis: Subcutaneous Lovenox   Advance goals of care discussion: Full code  Family Communication: family was present at bedside, at the time of interview.  The pt provided permission to discuss medical plan with the family. Opportunity was given to ask  question and all questions were answered satisfactorily.   Disposition:  Pt is from home, admitted with sepsis, UTI and bacteremia, gradually improving, still has some electrolyte abnormality, low blood pressure which precludes a safe discharge. Discharge to home, when 1 to 2 days.  Subjective: No overnight issues, patient has chronic pain in the bilateral lower extremities and hip joint, no any other active issues.  Seems to be doing fine.  Did not offer any complaints.  Patient is bedbound for the past 3 years, stays at home, being taken care of by family and home health.   Physical Exam: General:  alert, NAD.  Appear in mild distress, affect appropriate Eyes: PERRLA ENT: Oral Mucosa Clear, moist  Neck: no JVD,  Cardiovascular: S1 and S2 Present, no Murmur,  Respiratory: good respiratory effort, Bilateral Air entry equal and Decreased, no Crackles, no wheezes Abdomen: Bowel Sound present, Soft and no tenderness,  Skin: Bilateral lower extremity scaly and dry skin due to chronic lymphedema Extremities: Chronic lymphedema thickened scaly skin, dry feet Neurologic: without any new focal findings patient is bedbound for past 3 years secondary to severe bilateral lower extremity lymphedema Gait not checked due to patient safety concerns  Vitals:   07/05/21 1945 07/05/21 2111 07/06/21 0449 07/06/21 0756  BP: (!) 97/49 (!) 103/47 (!) 101/40 (!) 91/45  Pulse: 83 79 90 86  Resp: 20 20 20 18   Temp: 97.7 F (36.5 C) 97.7 F (36.5 C) 97.8 F (36.6 C)   TempSrc: Axillary Oral Oral   SpO2: 100% 99% 100% 99%  Weight:      Height:        Intake/Output Summary (Last 24 hours) at 07/06/2021 1613 Last data filed at 07/06/2021 0520 Gross per 24 hour  Intake 1110.38 ml  Output 300 ml  Net 810.38 ml   Filed Weights   06/30/21 1821 07/01/21 1628  Weight: (!) 150 kg (!) 196.3 kg    Data Reviewed: I have personally reviewed and interpreted daily labs, tele strips, imagings as discussed  above. I reviewed all nursing notes, pharmacy notes, vitals, pertinent old records I have discussed plan of care as described above with RN and patient/family.  CBC: Recent Labs  Lab 06/30/21 1830 07/01/21 0858 07/02/21 0625 07/03/21 0525 07/04/21 0532 07/04/21 2317 07/05/21 0622 07/06/21 0453  WBC 26.9*   < > 16.9* 17.0* 14.6*  --  14.5* 14.3*  NEUTROABS 24.3*  --   --   --   --   --   --   --   HGB 7.8*   < > 7.5* 7.3* 7.0* 7.8* 7.4* 7.3*  HCT 24.6*   < > 23.3* 22.8* 22.1* 24.6* 23.0* 22.9*  MCV 90.4   < > 87.6 86.7 86.0  --  87.1 87.1  PLT 302   < > 279 261 239  --  212 211   < > = values in this interval not displayed.   Basic Metabolic Panel: Recent Labs  Lab 07/02/21 0554 07/03/21 0525 07/04/21 0532 07/05/21 0622 07/06/21 0453  NA 133*  137 136 136 138  K 3.0* 3.6 3.4* 3.3* 3.3*  CL 97* 104 105 104 106  CO2 26 27 26 24 26   GLUCOSE 102* 87 82 74 75  BUN 42* 16 18 18 18   CREATININE 0.85 1.06* 1.12* 1.05* 1.16*  CALCIUM 8.5* 7.4* 7.2* 7.0* 7.1*  MG 1.8 1.7 1.7 1.8 1.8  PHOS 2.5 2.5 2.3* 2.3* 2.9    Studies: No results found.  Scheduled Meds:  ammonium lactate   Topical BID   clotrimazole-betamethasone   Topical BID   enoxaparin (LOVENOX) injection  0.5 mg/kg Subcutaneous Q24H   feeding supplement  237 mL Oral TID   folic acid  1 mg Oral Daily   midodrine  10 mg Oral TID WC   [START ON 07/07/2021] pantoprazole  40 mg Oral Daily   PARoxetine  30 mg Oral Daily   potassium chloride  40 mEq Oral Once   Vitamin D (Ergocalciferol)  50,000 Units Oral Q7 days   Continuous Infusions:  sodium chloride 75 mL/hr at 07/06/21 0619   sodium chloride 50 mL/hr at 07/06/21 1058   piperacillin-tazobactam (ZOSYN)  IV 3.375 g (07/06/21 1449)   PRN Meds: acetaminophen **OR** acetaminophen, morphine injection, ondansetron **OR** ondansetron (ZOFRAN) IV, oxyCODONE-acetaminophen, phenol  Time spent: 35 minutes  Author: Val Riles. MD [facility] 07/06/2021 4:13  PM  To reach On-call, see care teams to locate the attending and reach out to them via [email or web address]. If 7PM-7AM, please contact night-coverage If you still have difficulty reaching the attending provider, please page the [facility] (Director on Call) for [facility] on amion for assistance.

## 2021-07-06 NOTE — Care Management Important Message (Signed)
Important Message  Patient Details  Name: DEVORAH LLAMAS MRN: [id number] Date of Birth: [birth year]   Medicare Important Message Given:  Yes     Dannette Barbara 07/06/2021, 1:29 PM

## 2021-07-07 DIAGNOSIS — Z515 Encounter for palliative care: Secondary | ICD-10-CM

## 2021-07-07 LAB — MAGNESIUM: Magnesium: 1.8 mg/dL (ref 1.7–2.4)

## 2021-07-07 LAB — BASIC METABOLIC PANEL
Anion gap: 6 (ref 5–15)
BUN: 19 mg/dL (ref 8–23)
CO2: 24 mmol/L (ref 22–32)
Calcium: 6.9 mg/dL — ABNORMAL LOW (ref 8.9–10.3)
Chloride: 107 mmol/L (ref 98–111)
Creatinine, Ser: 1.17 mg/dL — ABNORMAL HIGH (ref 0.44–1.00)
GFR, Estimated: 50 mL/min — ABNORMAL LOW (ref >60)
Glucose, Bld: 85 mg/dL (ref 70–99)
Potassium: 3.5 mmol/L (ref 3.5–5.1)
Sodium: 137 mmol/L (ref 135–145)

## 2021-07-07 LAB — CBC
HCT: 22.8 % — ABNORMAL LOW (ref 36.0–46.0)
Hemoglobin: 7.3 g/dL — ABNORMAL LOW (ref 12.0–15.0)
MCH: 27.1 pg (ref 26.0–34.0)
MCHC: 32 g/dL (ref 30.0–36.0)
MCV: 84.8 fL (ref 80.0–100.0)
Platelets: 217 10*3/uL (ref 150–400)
RBC: 2.69 MIL/uL — ABNORMAL LOW (ref 3.87–5.11)
RDW: 17.4 % — ABNORMAL HIGH (ref 11.5–15.5)
WBC: 13.5 10*3/uL — ABNORMAL HIGH (ref 4.0–10.5)
nRBC: 0 % (ref 0.0–0.2)

## 2021-07-07 LAB — IRON AND TIBC: Iron: 28 ug/dL (ref 28–170)

## 2021-07-07 LAB — PHOSPHORUS: Phosphorus: 3.1 mg/dL (ref 2.5–4.6)

## 2021-07-07 MED ORDER — POTASSIUM CHLORIDE CRYS ER 20 MEQ PO TBCR
40.0000 meq | EXTENDED_RELEASE_TABLET | Freq: Once | ORAL | Status: AC
  Administered 2021-07-07: 40 meq via ORAL
  Filled 2021-07-07: qty 2

## 2021-07-07 MED ORDER — ASCORBIC ACID 500 MG PO TABS
500.0000 mg | ORAL_TABLET | Freq: Every day | ORAL | Status: DC
  Administered 2021-07-07 – 2021-07-11 (×5): 500 mg via ORAL
  Filled 2021-07-07 (×5): qty 1

## 2021-07-07 MED ORDER — ZINC SULFATE 220 (50 ZN) MG PO CAPS
220.0000 mg | ORAL_CAPSULE | Freq: Every day | ORAL | Status: DC
  Administered 2021-07-07 – 2021-07-11 (×5): 220 mg via ORAL
  Filled 2021-07-07 (×5): qty 1

## 2021-07-07 NOTE — Evaluation (Signed)
Occupational Therapy Evaluation Patient Details Name: Kayla Long MRN: [id number] DOB: Jun 30, 1946 Today's Date: 07/07/2021    History of Present Illness Pt is a 75 y/o F with PMH: morbid obesity, HTN, and bilateral lower extremity lymphedema who presented to ED with concerns for chills and lethargy. Pt found to have sepsis 2/2 UTI.   Clinical Impression   Pt seen for OT evaluation this date in setting of acute hospitalization d/t UTI. Pt and family member indicate that pt is bed-bound at baseline and has been for ~3 years d/t LE edema. Pt also reporting intense pain with attempts to mobilize/roll in bed. OT attempts to engage pt in ROM, she has functional ROM of her upper extremities, but is noted to have decreased strength of ~4-/5. Pt with very minimal ankle DF/PF is the only active LE ROM that pt can perform. Can tolerate some DF/PF PROM. Cannot tolerate attempts at knee ROM or attempts at hip rotation.   Pt assisted with some TOTAL A repositioning and encouraged to do as much ROM as she could tolerate and attempt to at least gradually build up core and UE strength to reduce caregiver burden. Pt and family agreeable. Pt in size wise bed while hospitalized and family reporting her hospital bed at home is much smaller. Recommend bariatric hospital bed for home as well as air mattress for skin. Anticipate pt and family could benefit from some home health aide assist as well;  her caregiver reports recent R rotator cuff surgery and lifting restrictions. Pt left with all needs met and in reach. No OT f/u appears warranted at this time as pt appears to be close to her functional baseline.     Follow Up Recommendations  No OT follow up    Equipment Recommendations  Other (comment) (bariatric hospital bed, air mattress for skin)    Recommendations for Other Services       Precautions / Restrictions Precautions Precautions: Fall Restrictions Weight Bearing Restrictions: No       Mobility Bed Mobility Overal bed mobility: Needs Assistance Bed Mobility: Rolling Rolling: Total assist;+2 for physical assistance         General bed mobility comments: attempted to laterally roll one time, pt with c/o pain, almost anticipatory, and her family member who was present essentially did not want OT to continue mobilizing    Transfers                 General transfer comment: not able, not safe. total A with hoyer at baseline, but family endorses hoyering pt very seldomly    Balance                                           ADL either performed or assessed with clinical judgement   ADL Overall ADL's : Needs assistance/impaired;At baseline                                       General ADL Comments: Pt requires SETUP to MIN A for essentially all bed level UB ADLs in high-fowler's position. REquires TOTAL A +2-3 people for bed mobility and all LB ADLs.     Vision Baseline Vision/History: Wears glasses Wears Glasses: At all times Additional Comments: pt's caregiver present in room reports that pt has declining vision and  has not been able to get to an eye doctor d/t her decreased mobility which presents an issue with updating prescription.     Perception     Praxis      Pertinent Vitals/Pain Pain Assessment: Faces Faces Pain Scale: Hurts even more Pain Location: lower extremities Pain Descriptors / Indicators: Sore;Tender Pain Intervention(s): Limited activity within patient's tolerance;Monitored during session     Hand Dominance     Extremity/Trunk Assessment Upper Extremity Assessment Upper Extremity Assessment: Generalized weakness   Lower Extremity Assessment Lower Extremity Assessment: Generalized weakness (severe/profound weakness of LEs. very minimal ankle DF/PF is the only active LE ROM that pt can perform. Can tolerate some DF/PF PROM. Cannot tolerate attempts at knee ROM or attempts at hip rotation.)        Communication Communication Communication: Other (comment) (hypophonic for much of session)   Cognition Arousal/Alertness: Awake/alert Behavior During Therapy: WFL for tasks assessed/performed Overall Cognitive Status: Difficult to assess                                 General Comments: Pt oriented to self and location, but not time or situation. Able to follow most simple one step commands. Pt and family member/caregiver that is present, with some decreased insight into pt's medical condition and potential. Ex: asking if the lymphedema (that has been chrnoically present for 3 years in bilateral lower ext) can simply be drained, however, seemingly not cognizant of the status of the skin which would indicate severe chronic decreased circulation.   General Comments       Exercises Other Exercises Other Exercises: extended education with pt and family member re: role of OT in acute setting and in other settings.   Shoulder Instructions      Home Living Family/patient expects to be discharged to:: Private residence Living Arrangements: Children Available Help at Discharge: Family;Available 24 hours/day Type of Home: [facility] bed;Wheelchair - manual (hoyer lift)          Prior Functioning/Environment Level of Independence: Needs assistance  Gait / Transfers Assistance Needed: bed bound at baseline for 3 years ADL's / Homemaking Assistance Needed: pt's daughters and "roommate" assist with rolling bed level for bathing, toileting and dressing ADLs. Pt can feed herself and perform g/h tasks with SETUP from bed level.   Comments: has hospital bed, but pt and family report it's too small and often when they are rolling, they get to the edge and don't have enough room.        OT Problem List: Obesity;Pain;Increased edema;Decreased range of motion;Decreased strength      OT Treatment/Interventions:  Self-care/ADL training;Therapeutic activities    OT Goals(Current goals can be found in the care plan section) Acute Rehab OT Goals Patient Stated Goal: to go home OT Goal Formulation: All assessment and education complete, DC therapy  OT Frequency:     Barriers to D/C:            Co-evaluation              AM-PAC OT "6 Clicks" Daily Activity     Outcome Measure Help from another person eating meals?: A Little Help from another person taking care of personal grooming?: A Little Help from another person toileting, which includes using toliet, bedpan, or urinal?: Total  Help from another person bathing (including washing, rinsing, drying)?: Total Help from another person to put on and taking off regular upper body clothing?: Total Help from another person to put on and taking off regular lower body clothing?: Total 6 Click Score: 10   End of Session    Activity Tolerance: Patient limited by pain Patient left: in bed;with call bell/phone within reach;with family/visitor present  OT Visit Diagnosis: Muscle weakness (generalized) (M62.81);Adult, failure to thrive (R62.7);Pain Pain - part of body:  (bilateral lower extremities)                Time: 9407-6808 OT Time Calculation (min): 38 min Charges:  OT General Charges $OT Visit: 1 Visit OT Evaluation $OT Eval Moderate Complexity: 1 Mod OT Treatments $Self Care/Home Management : 8-22 mins  Gerrianne Scale, MS, OTR/L ascom [phone] 07/07/21, 4:02 PM

## 2021-07-07 NOTE — Progress Notes (Signed)
[facility] Progress Note  Patient: Kayla Long    ZJQ:[id number]  DOA: 06/30/2021     Date of Service: the patient was seen and examined on 07/07/2021  Chief Complaint  Patient presents with   Altered Mental Status   Brief hospital course: Kayla Long is a 75 y.o. female with medical history significant for morbid obesity, history of hypertension, bilateral lymphedema, chronically bedbound, presents the emergency department for chief concerns of chills and lethargy.   At bedside patient is able to tell me her name, her age, the current calendar year, and her current location.  She appears to be awake and alert and oriented.  She reports that she is presenting to the emergency department for bilateral lower abdominal pain that started on day of presentation.  She denies headaches, shortness of breath, dysuria, diarrhea that she knows of.  She denies loss of consciousness.   She endorses burning in her chest after eating and reports this has been ongoing for two weeks.    Per daughter at bedside patient's blood pressure normally runs in the 24O systolic.   Hospital course from Dr. Jimmye Norman 8/13-8/16/22: Pt presented w/ chills and lethargy likely secondary to bacteremia & possible. Blood cxs grew citrobater. Initial urine cx grew multiple species and the second urine cx shows <10,000 colonies. Pt is on IV zosyn and can be switch to po (quinolone) at d/c as per ID. The source of bacteremia is unclear, possibly the skin. Repeat blood cxs are NGTD. Of note, pt's BP has been low to low normal despite IVFs, midodrine. Hb 7.0, 1 unit of pRBCs given on 8/16.    Assessment and Plan: Principal Problem:   Sepsis secondary to UTI [facility]) Active Problems:   Sepsis associated hypotension ([place])   History of hypertension   Lymphedema of upper extremity, bilateral   Morbid obesity with BMI of 50.0-59.9, adult (HCC)   IBS (irritable bowel syndrome)   Sepsis ([place])     Sepsis: met criteria  w/ tachycardia, tachypnea, leukocytosis, elevated lactic acid & likely UTI & bacteremia. Procal 8.39.  Sepsis resolved but blood pressure still soft Continue gentle hydration Continue IV zosyn Continue Midodrine 10 mg p.o. 3 times daily    Bacteremia: blood cxs growing citrobacter koseri, sens pending. Repeat blood cxs NGTD. Continue on IV zosyn. Source is unknown, possibly the skin. Bedbound. ID following and recs apprec Recommended Levaquin on discharge through 8/21  UTI: UA is positive. Repeat urine cx shows insignificant growth. Continue on IV zosyn  US renal no acute findings   sacral decubitus ulcer: unstageable. Continue w/ wound care and frequent turning every 2 hourly   Leukocytosis: secondary to infection. Trending down. Continue on IV abxs    ACD: Hb is 7.0 & BP is low to low normal. 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 Resolved    Hypokalemia: Potassium repleted   HTN: will continue to hold home dose of amlodipine secondary to low to low normal BP   Midodrine 10 mg p.o. 3 times daily was started on 8/12   Depression: severity unknown. Continue on home dose of citalopram    Morbid obesity: BMI 69.8. Complicates overall care & prognosis    Severe Lymphedema: of b/l LE, chronic skin changes Apply ammonium lactate twice daily and Lotrisone cream twice daily Continue w/ supportive care  Vitamin D deficiency, started vitamin D 50,000 units p.o. weekly  Hypophosphatemia and  hypokalemia, repleted IV  Body mass index is 69.85 kg/m.  Interventions:       Diet: Heart healthy DVT Prophylaxis: Subcutaneous Lovenox   Advance goals of care discussion: Full code  Family Communication: family was present at bedside, at the time of interview.  The pt provided permission to discuss medical plan with the family. Opportunity was given to ask  question and all questions were answered satisfactorily.   Disposition:  Pt is from home, admitted with sepsis, UTI and bacteremia, gradually improving, still has some electrolyte abnormality, low blood pressure which precludes a safe discharge. Discharge to home, when 1 to 2 days.  Subjective: No overnight issues, patient has chronic pain in the bilateral lower extremities and hip joint, no any other active issues.  Seems to be doing fine.  Did not offer any complaints.  Patient is bedbound for the past 3 years, stays at home, being taken care of by family and home health.   Physical Exam: General:  alert, NAD.  Appear in mild distress, affect appropriate Eyes: PERRLA ENT: Oral Mucosa Clear, moist  Neck: no JVD,  Cardiovascular: S1 and S2 Present, no Murmur,  Respiratory: good respiratory effort, Bilateral Air entry equal and Decreased, no Crackles, no wheezes Abdomen: Bowel Sound present, Soft and no tenderness,  Skin: Bilateral lower extremity scaly and dry skin due to chronic lymphedema Extremities: Chronic lymphedema thickened scaly skin, dry feet Neurologic: without any new focal findings patient is bedbound for past 3 years secondary to severe bilateral lower extremity lymphedema Gait not checked due to patient safety concerns  Vitals:   07/07/21 0102 07/07/21 0430 07/07/21 0635 07/07/21 0751  BP: (!) 96/56 (!) 117/56 (!) 108/51 (!) 96/48  Pulse: 81 86 81 85  Resp: _0 Temp: (!) 97.5 F (36.4 C) 97.9 F (36.6 C)  97.6 F (36.4 C)  TempSrc: Oral   Oral  SpO2: 100% 99%  100%  Weight:      Height:        Intake/Output Summary (Last 24 hours) at 07/07/2021 1406 Last data filed at 07/07/2021 1022 Gross per 24 hour  Intake 2259.74 ml  Output --  Net 2259.74 ml   Filed Weights   06/30/21 1821 07/01/21 1628  Weight: (!) 150 kg (!) 196.3 kg    Data Reviewed: I have personally reviewed and interpreted daily labs, tele strips, imagings as discussed above. I  reviewed all nursing notes, pharmacy notes, vitals, pertinent old records I have discussed plan of care as described above with RN and patient/family.  CBC: Recent Labs  Lab 06/30/21 1830 07/01/21 0858 07/03/21 0525 07/04/21 0532 07/04/21 2317 07/05/21 0622 07/06/21 0453 07/07/21 0416  WBC 26.9*   < > 17.0* 14.6*  --  14.5* 14.3* 13.5*  NEUTROABS 24.3*  --   --   --   --   --   --   --   HGB 7.8*   < > 7.3* 7.0* 7.8* 7.4* 7.3* 7.3*  HCT 24.6*   < > 22.8* 22.1* 24.6* 23.0* 22.9* 22.8*  MCV 90.4   < > 86.7 86.0  --  87.1 87.1 84.8  PLT 302   < > 261 239  --  212 211 217   < > = values in this interval not displayed.   Basic Metabolic Panel: Recent Labs  Lab 07/03/21 0525 07/04/21 0532 07/05/21 0622 07/06/21 0453 07/07/21 3235  NA 137 136 136 138 137  K 3.6 3.4* 3.3* 3.3* 3.5  CL 104 105 104 106 107  CO2 _0 GLUCOSE 87 82 74 75 85  BUN _1 CREATININE 1.06* 1.12* 1.05* 1.16* 1.17*  CALCIUM 7.4* 7.2* 7.0* 7.1* 6.9*  MG 1.7 1.7 1.8 1.8 1.8  PHOS 2.5 2.3* 2.3* 2.9 3.1    Studies: No results found.  Scheduled Meds:  ammonium lactate   Topical BID   clotrimazole-betamethasone   Topical BID   enoxaparin (LOVENOX) injection  0.5 mg/kg Subcutaneous Q24H   feeding supplement  237 mL Oral TID   folic acid  1 mg Oral Daily   midodrine  10 mg Oral TID WC   pantoprazole  40 mg Oral Daily   PARoxetine  30 mg Oral Daily   Vitamin D (Ergocalciferol)  50,000 Units Oral Q7 days   Continuous Infusions:  piperacillin-tazobactam (ZOSYN)  IV 3.375 g (07/07/21 1217)   PRN Meds: acetaminophen **OR** acetaminophen, morphine injection, ondansetron **OR** ondansetron (ZOFRAN) IV, oxyCODONE-acetaminophen, phenol  Time spent: 35 minutes  Author: Val Riles. MD [facility] 07/07/2021 2:06 PM  To reach On-call, see care teams to locate the attending and reach out to them via [email or web address]. If 7PM-7AM, please contact night-coverage If you still have  difficulty reaching the attending provider, please page the [facility] (Director on Call) for [facility] on amion for assistance.

## 2021-07-07 NOTE — Plan of Care (Signed)
Palliative: Thank you for this consult. Unfortunately due to high volume of consults there will be a delay in a Palliative Provider seeing this patient. Palliative Medicine will return to service on 8/22 and will see patient at that time.   No charge [name], NP Palliative Medicine Please call Palliative Medicine team phone with any questions [phone]. For individual providers please see AMION

## 2021-07-08 LAB — PHOSPHORUS: Phosphorus: 3 mg/dL (ref 2.5–4.6)

## 2021-07-08 LAB — BASIC METABOLIC PANEL
Anion gap: 7 (ref 5–15)
BUN: 19 mg/dL (ref 8–23)
CO2: 24 mmol/L (ref 22–32)
Calcium: 6.9 mg/dL — ABNORMAL LOW (ref 8.9–10.3)
Chloride: 106 mmol/L (ref 98–111)
Creatinine, Ser: 1.19 mg/dL — ABNORMAL HIGH (ref 0.44–1.00)
GFR, Estimated: 49 mL/min — ABNORMAL LOW (ref >60)
Glucose, Bld: 74 mg/dL (ref 70–99)
Potassium: 3.6 mmol/L (ref 3.5–5.1)
Sodium: 137 mmol/L (ref 135–145)

## 2021-07-08 LAB — CULTURE, BLOOD (ROUTINE X 2)
Culture: NO GROWTH
Culture: NO GROWTH
Special Requests: ADEQUATE

## 2021-07-08 LAB — CBC
HCT: 22.2 % — ABNORMAL LOW (ref 36.0–46.0)
Hemoglobin: 7.3 g/dL — ABNORMAL LOW (ref 12.0–15.0)
MCH: 28.2 pg (ref 26.0–34.0)
MCHC: 32.9 g/dL (ref 30.0–36.0)
MCV: 85.7 fL (ref 80.0–100.0)
Platelets: 233 10*3/uL (ref 150–400)
RBC: 2.59 MIL/uL — ABNORMAL LOW (ref 3.87–5.11)
RDW: 17.4 % — ABNORMAL HIGH (ref 11.5–15.5)
WBC: 12 10*3/uL — ABNORMAL HIGH (ref 4.0–10.5)
nRBC: 0 % (ref 0.0–0.2)

## 2021-07-08 LAB — MAGNESIUM: Magnesium: 1.8 mg/dL (ref 1.7–2.4)

## 2021-07-08 MED ORDER — POLYSACCHARIDE IRON COMPLEX 150 MG PO CAPS
150.0000 mg | ORAL_CAPSULE | Freq: Every day | ORAL | Status: DC
  Administered 2021-07-08 – 2021-07-11 (×4): 150 mg via ORAL
  Filled 2021-07-08 (×4): qty 1

## 2021-07-08 MED ORDER — SODIUM CHLORIDE 0.9 % IV SOLN: INTRAVENOUS | Status: DC

## 2021-07-08 NOTE — Progress Notes (Signed)
[facility] Progress Note  Patient: Kayla Long    VQQ:[id number]  DOA: 06/30/2021     Date of Service: the patient was seen and examined on 07/08/2021  Chief Complaint  Patient presents with   Altered Mental Status   Brief hospital course: Kayla Long is a 75 y.o. female with medical history significant for morbid obesity, history of hypertension, bilateral lymphedema, chronically bedbound, presents the emergency department for chief concerns of chills and lethargy.   At bedside patient is able to tell me her name, her age, the current calendar year, and her current location.  She appears to be awake and alert and oriented.  She reports that she is presenting to the emergency department for bilateral lower abdominal pain that started on day of presentation.  She denies headaches, shortness of breath, dysuria, diarrhea that she knows of.  She denies loss of consciousness.   She endorses burning in her chest after eating and reports this has been ongoing for two weeks.    Per daughter at bedside patient's blood pressure normally runs in the 43P systolic.   Hospital course from Dr. Jimmye Norman 8/13-8/16/22: Pt presented w/ chills and lethargy likely secondary to bacteremia & possible. Blood cxs grew citrobater. Initial urine cx grew multiple species and the second urine cx shows <10,000 colonies. Pt is on IV zosyn and can be switch to po (quinolone) at d/c as per ID. The source of bacteremia is unclear, possibly the skin. Repeat blood cxs are NGTD. Of note, pt's BP has been low to low normal despite IVFs, midodrine. Hb 7.0, 1 unit of pRBCs given on 8/16.    Assessment and Plan: Principal Problem:   Sepsis secondary to UTI [facility]) Active Problems:   Sepsis associated hypotension ([place])   History of hypertension   Lymphedema of upper extremity, bilateral   Morbid obesity with BMI of 50.0-59.9, adult (HCC)   IBS (irritable bowel syndrome)   Sepsis ([place])     Sepsis: met criteria  w/ tachycardia, tachypnea, leukocytosis, elevated lactic acid & likely UTI & bacteremia. Procal 8.39.  Sepsis resolved but blood pressure still soft Continue gentle hydration Continue IV zosyn x 7 days till 8/21 as per ID Continue Midodrine 10 mg p.o. 3 times daily    Bacteremia: blood cxs growing citrobacter koseri, sens pending. Repeat blood cxs NGTD. Continue on IV zosyn. Source is unknown, possibly the skin. Bedbound. ID following and recs apprec Recommended Levaquin on discharge through 8/21  UTI: UA is positive. Repeat urine cx shows insignificant growth. Continue on IV zosyn  US renal no acute findings   sacral decubitus ulcer: unstageable. Continue w/ wound care and frequent turning every 2 hourly   Leukocytosis: secondary to infection. Trending down. Continue on IV abxs    ACD: Hb is 7.0 & BP is low to low normal. S/p 1 unit of pRBCs 8/19 Hb 7.3     Likely CKD: baseline Cr is unknown, currently stage IIIa.Cr is labile  Continue gentle hydration and monitor renal function daily and monitor urine output   Burning chest discomfort: likely secondary to GERD. Resolved    Hypokalemia: Potassium repleted   HTN: will continue to hold home dose of amlodipine secondary to low to low normal BP   Midodrine 10 mg p.o. 3 times daily was started on 8/12   Depression: severity unknown. Continue on home dose of citalopram    Morbid obesity: BMI 69.8. Complicates overall care & prognosis    Severe Lymphedema: of b/l  LE, chronic skin changes Apply ammonium lactate twice daily and Lotrisone cream twice daily Continue w/ supportive care  Patient is bedbound since 3 years  Vitamin D deficiency, started vitamin D 50,000 units p.o. weekly Folic acid deficiency, started folic acid 1 mg daily  Hypophosphatemia and  hypokalemia, repleted IV  Body mass index is 69.85 kg/m.  Interventions:       Diet: Heart healthy DVT Prophylaxis: Subcutaneous Lovenox   Advance goals of care  discussion: Full code  Family Communication: family was present at bedside, at the time of interview.  The pt provided permission to discuss medical plan with the family. Opportunity was given to ask question and all questions were answered satisfactorily.   Disposition:  Pt is from home, admitted with sepsis, UTI and bacteremia, gradually improving, still has some electrolyte abnormality, low blood pressure which precludes a safe discharge. Discharge to home, when 1 to 2 days.  Subjective: No overnight issues, patient has chronic pain in the bilateral lower extremities and hip joint, no any other active issues.  Seems to be doing fine.  Did not offer any complaints.  Patient is bedbound for the past 3 years, stays at home, being taken care of by family and home health.   Physical Exam: General:  alert, NAD.  Appear in mild distress, affect appropriate Eyes: PERRLA ENT: Oral Mucosa Clear, moist  Neck: no JVD,  Cardiovascular: S1 and S2 Present, no Murmur,  Respiratory: good respiratory effort, Bilateral Air entry equal and Decreased, no Crackles, no wheezes Abdomen: Bowel Sound present, Soft and no tenderness,  Skin: Bilateral lower extremity scaly and dry skin due to chronic lymphedema Extremities: Chronic lymphedema thickened scaly skin, dry feet Neurologic: without any new focal findings patient is bedbound for past 3 years secondary to severe bilateral lower extremity lymphedema Gait not checked due to patient safety concerns  Vitals:   07/07/21 1642 07/07/21 1917 07/08/21 0447 07/08/21 0820  BP: (!) 118/59 (!) 97/44 (!) 103/44 (!) 104/52  Pulse: 87 89 (!) 105 89  Resp: _0 Temp: (!) 97.3 F (36.3 C) 98.1 F (36.7 C) 98.4 F (36.9 C) 97.8 F (36.6 C)  TempSrc: Oral Oral Oral Oral  SpO2: 100% 99% 100% 99%  Weight:      Height:        Intake/Output Summary (Last 24 hours) at 07/08/2021 1416 Last data filed at 07/08/2021 1336 Gross per 24 hour  Intake 360 ml   Output 400 ml  Net -40 ml   Filed Weights   06/30/21 1821 07/01/21 1628  Weight: (!) 150 kg (!) 196.3 kg    Data Reviewed: I have personally reviewed and interpreted daily labs, tele strips, imagings as discussed above. I reviewed all nursing notes, pharmacy notes, vitals, pertinent old records I have discussed plan of care as described above with RN and patient/family.  CBC: Recent Labs  Lab 07/04/21 0532 07/04/21 2317 07/05/21 0622 07/06/21 0453 07/07/21 0416 07/08/21 0516  WBC 14.6*  --  14.5* 14.3* 13.5* 12.0*  HGB 7.0* 7.8* 7.4* 7.3* 7.3* 7.3*  HCT 22.1* 24.6* 23.0* 22.9* 22.8* 22.2*  MCV 86.0  --  87.1 87.1 84.8 85.7  PLT 239  --  212 211 217 517   Basic Metabolic Panel: Recent Labs  Lab 07/04/21 0532 07/05/21 0622 07/06/21 0453 07/07/21 0416 07/08/21 0516  NA 136 136 138 137 137  K 3.4* 3.3* 3.3* 3.5 3.6  CL 105 104 106 107 106  CO2 26 24  _0 GLUCOSE 82 74 75 85 74  BUN _1 CREATININE 1.12* 1.05* 1.16* 1.17* 1.19*  CALCIUM 7.2* 7.0* 7.1* 6.9* 6.9*  MG 1.7 1.8 1.8 1.8 1.8  PHOS 2.3* 2.3* 2.9 3.1 3.0    Studies: No results found.  Scheduled Meds:  ammonium lactate   Topical BID   vitamin C  500 mg Oral Daily   clotrimazole-betamethasone   Topical BID   enoxaparin (LOVENOX) injection  0.5 mg/kg Subcutaneous Q24H   feeding supplement  237 mL Oral TID   folic acid  1 mg Oral Daily   iron polysaccharides  150 mg Oral Daily   midodrine  10 mg Oral TID WC   pantoprazole  40 mg Oral Daily   PARoxetine  30 mg Oral Daily   Vitamin D (Ergocalciferol)  50,000 Units Oral Q7 days   zinc sulfate  220 mg Oral Daily   Continuous Infusions:  sodium chloride 75 mL/hr at 07/08/21 1301   piperacillin-tazobactam (ZOSYN)  IV 3.375 g (07/08/21 1302)   PRN Meds: acetaminophen **OR** acetaminophen, morphine injection, ondansetron **OR** ondansetron (ZOFRAN) IV, oxyCODONE-acetaminophen, phenol  Time spent: 35 minutes  Author: Val Riles.  MD [facility] 07/08/2021 2:16 PM  To reach On-call, see care teams to locate the attending and reach out to them via [email or web address]. If 7PM-7AM, please contact night-coverage If you still have difficulty reaching the attending provider, please page the [facility] (Director on Call) for [facility] on amion for assistance.

## 2021-07-08 NOTE — Progress Notes (Signed)
Foams to sacrum changed as per md's orders, pt tol well,

## 2021-07-09 LAB — PREPARE RBC (CROSSMATCH)

## 2021-07-09 LAB — CBC
HCT: 22 % — ABNORMAL LOW (ref 36.0–46.0)
Hemoglobin: 6.9 g/dL — ABNORMAL LOW (ref 12.0–15.0)
MCH: 27 pg (ref 26.0–34.0)
MCHC: 31.4 g/dL (ref 30.0–36.0)
MCV: 85.9 fL (ref 80.0–100.0)
Platelets: 248 10*3/uL (ref 150–400)
RBC: 2.56 MIL/uL — ABNORMAL LOW (ref 3.87–5.11)
RDW: 17.9 % — ABNORMAL HIGH (ref 11.5–15.5)
WBC: 11.8 10*3/uL — ABNORMAL HIGH (ref 4.0–10.5)
nRBC: 0 % (ref 0.0–0.2)

## 2021-07-09 LAB — BASIC METABOLIC PANEL WITH GFR
Anion gap: 4 — ABNORMAL LOW (ref 5–15)
BUN: 18 mg/dL (ref 8–23)
CO2: 25 mmol/L (ref 22–32)
Calcium: 6.8 mg/dL — ABNORMAL LOW (ref 8.9–10.3)
Chloride: 108 mmol/L (ref 98–111)
Creatinine, Ser: 1.12 mg/dL — ABNORMAL HIGH (ref 0.44–1.00)
GFR, Estimated: 52 mL/min — ABNORMAL LOW
Glucose, Bld: 76 mg/dL (ref 70–99)
Potassium: 3.4 mmol/L — ABNORMAL LOW (ref 3.5–5.1)
Sodium: 137 mmol/L (ref 135–145)

## 2021-07-09 LAB — HEMOGLOBIN AND HEMATOCRIT, BLOOD
HCT: 23.3 % — ABNORMAL LOW (ref 36.0–46.0)
Hemoglobin: 7.6 g/dL — ABNORMAL LOW (ref 12.0–15.0)

## 2021-07-09 MED ORDER — HYDRALAZINE HCL 20 MG/ML IJ SOLN: 10.0000 mg | INTRAMUSCULAR | Status: DC | PRN

## 2021-07-09 MED ORDER — LEVOFLOXACIN 500 MG PO TABS
500.0000 mg | ORAL_TABLET | Freq: Every day | ORAL | Status: DC
  Administered 2021-07-09 – 2021-07-10 (×2): 500 mg via ORAL
  Filled 2021-07-09 (×2): qty 1

## 2021-07-09 MED ORDER — METOPROLOL TARTRATE 5 MG/5ML IV SOLN: 5.0000 mg | INTRAVENOUS | Status: DC | PRN

## 2021-07-09 MED ORDER — DM-GUAIFENESIN ER 30-600 MG PO TB12: 1.0000 | ORAL_TABLET | Freq: Two times a day (BID) | ORAL | Status: DC | PRN

## 2021-07-09 MED ORDER — SODIUM CHLORIDE 0.9% IV SOLUTION: Freq: Once | INTRAVENOUS | Status: AC

## 2021-07-09 MED ORDER — SODIUM CHLORIDE 0.9 % IV SOLN: INTRAVENOUS | Status: DC

## 2021-07-09 MED ORDER — SENNOSIDES-DOCUSATE SODIUM 8.6-50 MG PO TABS: 1.0000 | ORAL_TABLET | Freq: Every evening | ORAL | Status: DC | PRN

## 2021-07-09 NOTE — Progress Notes (Signed)
PT Cancellation Note  Patient Details Name: Kayla Long MRN: [id number] DOB: February 19, 1946   Cancelled Treatment:    Reason Eval/Treat Not Completed: Other (comment) PT orders received, chart reviewed. Per chart pt noted to be chronically bed bound for the past 3 years. No acute PT needs identified at this time. Please re-consult if new needs arise. Thank you.  Lavone Nian, PT, DPT 07/09/21, 12:56 PM    Kayla Long 07/09/2021, 12:56 PM

## 2021-07-09 NOTE — Progress Notes (Signed)
PROGRESS NOTE    Kayla Long  [id number] DOB: [birth year] DOA: 06/30/2021 PCP: No primary care provider on file.   Brief Narrative:  75 yo with pmhx of Morbid obesity, HTN, B/l Lymphedema, Chronically bedbound presents with chills and lethargy.    Assessment & Plan:   Principal Problem:   Sepsis secondary to UTI [facility]) Active Problems:   Sepsis associated hypotension ([place])   History of hypertension   Lymphedema of upper extremity, bilateral   Morbid obesity with BMI of 50.0-59.9, adult (HCC)   IBS (irritable bowel syndrome)   Sepsis ([place])   Palliative care by specialist   Sepsis secondary to Citrobacter koseri from urinary tract infection - Sepsis physiology is improving.  Seen by infectious disease - Antibiotics-IV Zosyn.  Recommend switching over to oral Levaquin 8/21 - Renal ultrasound- nothing acute.  - Midodrine TID   UTI: UA is positive. Repeat urine cx shows insignificant growth. Continue IV zosyn to Levaquin.  US renal no acute findings    Unstageable sacral decubitus ulcer-continue w/ wound care and frequent turning every 2 hourly   ACD: Hb is 7.0 & BP is low to low normal. S/p 1 unit of pRBCs 8/19 Hb 7.3    Folate deficiency-folic acid daily  Vitamin D deficiency-supplements ordered   Essential hypertension-Norvasc on hold.  Currently on midodrine 10 mg 3 times daily   Depression-citalopram   Morbid obesity: BMI 69.8. Complicates overall care & prognosis    Severe Lymphedema: of b/l LE, chronic skin changes -Routine wound care.   Body mass index is 69.85 kg/m.   Will need transport at home.    DVT prophylaxis: Lovenox Code Status: Full code Family Communication:  Daughter updated.   Status is: Inpatient  Remains inpatient appropriate because:Inpatient level of care appropriate due to severity of illness  Dispo: The patient is from: Home              Anticipated d/c is to: Home              Patient currently is not medically stable  to d/c. Needs PRBC today, transition to PO levaquin today. Hopefully home in 24- 48 hrs.    Difficult to place patient No  Subjective: Feeling tired but remains afebrile no new complaints.   Review of Systems Otherwise negative except as per HPI, including: General: Denies fever, chills, night sweats or unintended weight loss. Resp: Denies cough, wheezing, shortness of breath. Cardiac: Denies chest pain, palpitations, orthopnea, paroxysmal nocturnal dyspnea. GI: Denies abdominal pain, nausea, vomiting, diarrhea or constipation GU: Denies dysuria, frequency, hesitancy or incontinence MS: Denies muscle aches, joint pain or swelling Neuro: Denies headache, neurologic deficits (focal weakness, numbness, tingling), abnormal gait Psych: Denies anxiety, depression, SI/HI/AVH Skin: Denies new rashes or lesions ID: Denies sick contacts, exotic exposures, travel  Examination:  General exam: Appears calm and comfortable  Respiratory system: Clear to auscultation. Respiratory effort normal. Cardiovascular system: S1 & S2 heard, RRR. No JVD, murmurs, rubs, gallops or clicks. Chronic b/l LE lymphedema.  Gastrointestinal system: Abdomen is nondistended, soft and nontender. No organomegaly or masses felt. Normal bowel sounds heard. Central nervous system: Alert and oriented. No focal neurological deficits. Extremities: Symmetric 5 x 5 power. Skin: chronic b/l LE lymphedema, thick skin, dry feet.  Psychiatry: Judgement and insight appear normal. Mood & affect appropriate.     Objective: Vitals:   07/08/21 1534 07/08/21 2120 07/09/21 0541 07/09/21 0759  BP: (!) 110/46 (!) 92/43 (!) 114/54 (!) 95/45  Pulse: 92  90 94 91  Resp: '20  20 16  '$ Temp: (!) 97.5 F (36.4 C) 97.9 F (36.6 C) 97.9 F (36.6 C) 98.2 F (36.8 C)  TempSrc: Oral Oral Oral Oral  SpO2: 100% 100% 98% 98%  Weight:      Height:        Intake/Output Summary (Last 24 hours) at 07/09/2021 1018 Last data filed at 07/09/2021  [id number] Gross per 24 hour  Intake 613.42 ml  Output --  Net 613.42 ml   Filed Weights   06/30/21 1821 07/01/21 1628  Weight: (!) 150 kg (!) 196.3 kg     Data Reviewed:   CBC: Recent Labs  Lab 07/05/21 0622 07/06/21 0453 07/07/21 0416 07/08/21 0516 07/09/21 0512  WBC 14.5* 14.3* 13.5* 12.0* 11.8*  HGB 7.4* 7.3* 7.3* 7.3* 6.9*  HCT 23.0* 22.9* 22.8* 22.2* 22.0*  MCV 87.1 87.1 84.8 85.7 85.9  PLT 212 211 217 233 [id number]   Basic Metabolic Panel: Recent Labs  Lab 07/04/21 0532 07/05/21 0622 07/06/21 0453 07/07/21 0416 07/08/21 0516 07/09/21 0512  NA 136 136 138 137 137 137  K 3.4* 3.3* 3.3* 3.5 3.6 3.4*  CL 105 104 106 107 106 108  CO2 '26 24 26 24 24 25  '$ GLUCOSE 82 74 75 85 74 76  BUN '18 18 18 19 19 18  '$ CREATININE 1.12* 1.05* 1.16* 1.17* 1.19* 1.12*  CALCIUM 7.2* 7.0* 7.1* 6.9* 6.9* 6.8*  MG 1.7 1.8 1.8 1.8 1.8  --   PHOS 2.3* 2.3* 2.9 3.1 3.0  --    GFR: Estimated Creatinine Clearance: 81.8 mL/min (A) (by C-G formula based on SCr of 1.12 mg/dL (H)). Liver Function Tests: Recent Labs  Lab 07/03/21 0525 07/04/21 0532 07/05/21 0622  AST 14* 12* 13*  ALT '7 7 6  '$ ALKPHOS 102 98 96  BILITOT 0.5 0.4 0.6  PROT 5.5* 5.4* 5.3*  ALBUMIN 1.4* 1.3* 1.5*   No results for input(s): LIPASE, AMYLASE in the last 168 hours. No results for input(s): AMMONIA in the last 168 hours. Coagulation Profile: No results for input(s): INR, PROTIME in the last 168 hours. Cardiac Enzymes: No results for input(s): CKTOTAL, CKMB, CKMBINDEX, TROPONINI in the last 168 hours. BNP (last 3 results) No results for input(s): PROBNP in the last 8760 hours. HbA1C: No results for input(s): HGBA1C in the last 72 hours. CBG: No results for input(s): GLUCAP in the last 168 hours. Lipid Profile: No results for input(s): CHOL, HDL, LDLCALC, TRIG, CHOLHDL, LDLDIRECT in the last 72 hours. Thyroid Function Tests: No results for input(s): TSH, T4TOTAL, FREET4, T3FREE, THYROIDAB in the last 72  hours. Anemia Panel: Recent Labs    07/07/21 0416  TIBC NOT CALCULATED  IRON 28   Sepsis Labs: No results for input(s): PROCALCITON, LATICACIDVEN in the last 168 hours.  Recent Results (from the past 240 hour(s))  Resp Panel by RT-PCR (Flu A&B, Covid) Nasopharyngeal Swab     Status: None   Collection Time: 06/30/21  6:30 PM   Specimen: Nasopharyngeal Swab; Nasopharyngeal(NP) swabs in vial transport medium  Result Value Ref Range Status   SARS Coronavirus 2 by RT PCR NEGATIVE NEGATIVE Final    Comment: (NOTE) SARS-CoV-2 target nucleic acids are NOT DETECTED.  The SARS-CoV-2 RNA is generally detectable in upper respiratory specimens during the acute phase of infection. The lowest concentration of SARS-CoV-2 viral copies this assay can detect is 138 copies/mL. A negative result does not preclude SARS-Cov-2 infection and should not be used as the sole  basis for treatment or other patient management decisions. A negative result may occur with  improper specimen collection/handling, submission of specimen other than nasopharyngeal swab, presence of viral mutation(s) within the areas targeted by this assay, and inadequate number of viral copies(<138 copies/mL). A negative result must be combined with clinical observations, patient history, and epidemiological information. The expected result is Negative.  Fact Sheet for Patients:  EntrepreneurPulse.com.au  Fact Sheet for Healthcare Providers:  IncredibleEmployment.be  This test is no t yet approved or cleared by the [place] FDA and  has been authorized for detection and/or diagnosis of SARS-CoV-2 by FDA under an Emergency Use Authorization (EUA). This EUA will remain  in effect (meaning this test can be used) for the duration of the COVID-19 declaration under Section 564(b)(1) of the Act, 21 U.S.C.section 360bbb-3(b)(1), unless the authorization is terminated  or revoked sooner.        Influenza A by PCR NEGATIVE NEGATIVE Final   Influenza B by PCR NEGATIVE NEGATIVE Final    Comment: (NOTE) The Xpert Xpress SARS-CoV-2/FLU/RSV plus assay is intended as an aid in the diagnosis of influenza from Nasopharyngeal swab specimens and should not be used as a sole basis for treatment. Nasal washings and aspirates are unacceptable for Xpert Xpress SARS-CoV-2/FLU/RSV testing.  Fact Sheet for Patients: EntrepreneurPulse.com.au  Fact Sheet for Healthcare Providers: IncredibleEmployment.be  This test is not yet approved or cleared by the [place] FDA and has been authorized for detection and/or diagnosis of SARS-CoV-2 by FDA under an Emergency Use Authorization (EUA). This EUA will remain in effect (meaning this test can be used) for the duration of the COVID-19 declaration under Section 564(b)(1) of the Act, 21 U.S.C. section 360bbb-3(b)(1), unless the authorization is terminated or revoked.  Performed at [facility], [address], [place], [place] [address]   Blood Culture (routine x 2)     Status: Abnormal   Collection Time: 06/30/21  6:30 PM   Specimen: BLOOD  Result Value Ref Range Status   Specimen Description   Final    BLOOD RIGHT ANTECUBITAL Performed at [facility], [address] [place], [place] [address]    Special Requests   Final    BOTTLES DRAWN AEROBIC AND ANAEROBIC Blood Culture results may not be optimal due to an inadequate volume of blood received in culture bottles Performed at [facility], [address] [place], [place] [address]    Culture  Setup Time   Final    GRAM NEGATIVE RODS IN BOTH AEROBIC AND ANAEROBIC BOTTLES CRITICAL RESULT CALLED TO, READ BACK BY AND VERIFIED WITH: ALEX CHAPPELL AT [place] 07/01/21.PMF Performed at [facility], [place] [address], [place],  [address]    Culture CITROBACTER KOSERI (A)  Final   Report Status 07/03/2021 FINAL  Final    Organism ID, Bacteria CITROBACTER KOSERI  Final      Susceptibility   Citrobacter koseri - MIC*    CEFAZOLIN <=4 SENSITIVE Sensitive     CEFEPIME <=0.12 SENSITIVE Sensitive     CEFTAZIDIME <=1 SENSITIVE Sensitive     CEFTRIAXONE <=0.25 SENSITIVE Sensitive     CIPROFLOXACIN <=0.25 SENSITIVE Sensitive     GENTAMICIN <=1 SENSITIVE Sensitive     IMIPENEM <=0.25 SENSITIVE Sensitive     TRIMETH/SULFA <=20 SENSITIVE Sensitive     PIP/TAZO <=4 SENSITIVE Sensitive     * CITROBACTER KOSERI  Blood Culture ID Panel (Reflexed)     Status: Abnormal   Collection Time: 06/30/21  6:30 PM  Result Value Ref Range Status   Enterococcus faecalis NOT DETECTED NOT DETECTED Final   Enterococcus Faecium NOT DETECTED NOT DETECTED Final   Listeria monocytogenes NOT DETECTED NOT DETECTED Final   Staphylococcus species NOT DETECTED NOT DETECTED Final   Staphylococcus aureus (BCID) NOT DETECTED NOT DETECTED Final   Staphylococcus epidermidis NOT DETECTED NOT DETECTED Final   Staphylococcus lugdunensis NOT DETECTED NOT DETECTED Final   Streptococcus species NOT DETECTED NOT DETECTED Final   Streptococcus agalactiae NOT DETECTED NOT DETECTED Final   Streptococcus pneumoniae NOT DETECTED NOT DETECTED Final   Streptococcus pyogenes NOT DETECTED NOT DETECTED Final   A.calcoaceticus-baumannii NOT DETECTED NOT DETECTED Final   Bacteroides fragilis NOT DETECTED NOT DETECTED Final   Enterobacterales DETECTED (A) NOT DETECTED Final    Comment: Enterobacterales represent a large order of gram negative bacteria, not a single organism. Refer to culture for further identification. CRITICAL RESULT CALLED TO, READ BACK BY AND VERIFIED WITH: ALEX CHAPPELL AT [place] 07/01/21.PMF    Enterobacter cloacae complex NOT DETECTED NOT DETECTED Final   Escherichia coli NOT DETECTED NOT DETECTED Final   Klebsiella aerogenes NOT DETECTED NOT DETECTED Final   Klebsiella oxytoca NOT DETECTED NOT DETECTED Final   Klebsiella pneumoniae NOT  DETECTED NOT DETECTED Final   Proteus species NOT DETECTED NOT DETECTED Final   Salmonella species NOT DETECTED NOT DETECTED Final   Serratia marcescens NOT DETECTED NOT DETECTED Final   Haemophilus influenzae NOT DETECTED NOT DETECTED Final   Neisseria meningitidis NOT DETECTED NOT DETECTED Final   Pseudomonas aeruginosa NOT DETECTED NOT DETECTED Final   Stenotrophomonas maltophilia NOT DETECTED NOT DETECTED Final   Candida albicans NOT DETECTED NOT DETECTED Final   Candida auris NOT DETECTED NOT DETECTED Final   Candida glabrata NOT DETECTED NOT DETECTED Final   Candida krusei NOT DETECTED NOT DETECTED Final   Candida parapsilosis NOT DETECTED NOT DETECTED Final   Candida tropicalis NOT DETECTED NOT DETECTED Final   Cryptococcus neoformans/gattii NOT DETECTED NOT DETECTED Final   CTX-M ESBL NOT DETECTED NOT DETECTED Final   Carbapenem resistance IMP NOT DETECTED NOT DETECTED Final   Carbapenem resistance KPC NOT DETECTED NOT DETECTED Final   Carbapenem resistance NDM NOT DETECTED NOT DETECTED Final   Carbapenem resist OXA 48 LIKE NOT DETECTED NOT DETECTED Final   Carbapenem resistance VIM NOT DETECTED NOT DETECTED Final    Comment: Performed at [facility] [address] [place], [place] [address]  Blood Culture (routine x 2)     Status: Abnormal   Collection Time: 06/30/21  6:35 PM   Specimen: BLOOD  Result Value Ref Range Status   Specimen Description   Final    BLOOD LEFT ANTECUBITAL Performed at [facility], [address], [place], [place] [address]    Special Requests   Final    BOTTLES DRAWN AEROBIC AND ANAEROBIC Blood Culture adequate volume Performed at [facility], [address] [place], [place] [address]    Culture  Setup Time   Final    GRAM NEGATIVE RODS IN BOTH AEROBIC AND ANAEROBIC BOTTLES CRITICAL VALUE NOTED.  VALUE IS CONSISTENT WITH PREVIOUSLY REPORTED AND CALLED VALUE.    Culture (A)  Final    CITROBACTER  KOSERI SUSCEPTIBILITIES PERFORMED ON PREVIOUS CULTURE WITHIN THE LAST 5 DAYS. Performed at [facility], [place] [address] [place], [place] [address]    Report Status 07/03/2021 FINAL  Final  Urine Culture     Status: Abnormal  Collection Time: 06/30/21  8:00 PM   Specimen: In/Out Cath Urine  Result Value Ref Range Status   Specimen Description   Final    IN/OUT CATH URINE Performed at [facility], [address] [place], [place] [address]    Special Requests   Final    NONE Performed at [facility], [address] [place], [place] [address]    Culture MULTIPLE SPECIES PRESENT, SUGGEST RECOLLECTION (A)  Final   Report Status 07/02/2021 FINAL  Final  Urine Culture     Status: Abnormal   Collection Time: 07/02/21  8:13 AM   Specimen: Urine, Random  Result Value Ref Range Status   Specimen Description   Final    URINE, RANDOM Performed at [facility], [address], [place], [place] [address]    Special Requests   Final    NONE Performed at [facility], [address], [place], [place] [address]    Culture (A)  Final    <10,000 COLONIES/mL INSIGNIFICANT GROWTH Performed at [facility], [place] [address], [place], [place] [address]    Report Status 07/04/2021 FINAL  Final  CULTURE, BLOOD (ROUTINE X 2) w Reflex to ID Panel     Status: None   Collection Time: 07/03/21  9:53 AM   Specimen: BLOOD  Result Value Ref Range Status   Specimen Description BLOOD  LT [facility]  Final   Special Requests   Final    BOTTLES DRAWN AEROBIC AND ANAEROBIC Blood Culture adequate volume   Culture   Final    NO GROWTH 5 DAYS Performed at [facility], [address] [place], [place] [address]    Report Status 07/08/2021 FINAL  Final  CULTURE, BLOOD (ROUTINE X 2) w Reflex to ID Panel     Status: None   Collection Time: 07/03/21 10:05 AM   Specimen: BLOOD  Result Value Ref Range Status   Specimen Description BLOOD  LT HAND  Final    Special Requests   Final    BOTTLES DRAWN AEROBIC AND ANAEROBIC Blood Culture results may not be optimal due to an inadequate volume of blood received in culture bottles   Culture   Final    NO GROWTH 5 DAYS Performed at [facility], [address], [place],  [address]    Report Status 07/08/2021 FINAL  Final         Radiology Studies: No results found.      Scheduled Meds:  ammonium lactate   Topical BID   vitamin C  500 mg Oral Daily   clotrimazole-betamethasone   Topical BID   enoxaparin (LOVENOX) injection  0.5 mg/kg Subcutaneous Q24H   feeding supplement  237 mL Oral TID   folic acid  1 mg Oral Daily   iron polysaccharides  150 mg Oral Daily   midodrine  10 mg Oral TID WC   pantoprazole  40 mg Oral Daily   PARoxetine  30 mg Oral Daily   Vitamin D (Ergocalciferol)  50,000 Units Oral Q7 days   zinc sulfate  220 mg Oral Daily   Continuous Infusions:  sodium chloride 75 mL/hr at 07/08/21 1301   piperacillin-tazobactam (ZOSYN)  IV 3.375 g (07/09/21 0616)     LOS: 9 days   Time spent= 35 mins    Cleola Perryman Arsenio Loader, MD [facility]  If 7PM-7AM, please contact night-coverage  07/09/2021, 10:18 AM

## 2021-07-09 NOTE — Progress Notes (Signed)
OT Cancellation Note  Patient Details Name: KIONDRA FRACE MRN: [id number] DOB: [birth year]   Cancelled Treatment:    Reason Eval/Treat Not Completed: OT screened, no needs identified, will sign off. OT orders received. Per chart review, pt was evaluated by OT 2 days ago (8/19). OT evaluation reveals that pt is at baseline functional independence. No skilled OT needs identified at this time. Will sign off. Please re-consult if additional needs arise.  Fredirick Maudlin, OTR/L [place]

## 2021-07-10 DIAGNOSIS — Z7189 Other specified counseling: Secondary | ICD-10-CM

## 2021-07-10 DIAGNOSIS — Z8679 Personal history of other diseases of the circulatory system: Secondary | ICD-10-CM

## 2021-07-10 DIAGNOSIS — I959 Hypotension, unspecified: Secondary | ICD-10-CM

## 2021-07-10 LAB — TYPE AND SCREEN
ABO/RH(D): B POS
Antibody Screen: NEGATIVE
Unit division: 0

## 2021-07-10 LAB — BASIC METABOLIC PANEL
Anion gap: 5 (ref 5–15)
BUN: 17 mg/dL (ref 8–23)
CO2: 23 mmol/L (ref 22–32)
Calcium: 6.8 mg/dL — ABNORMAL LOW (ref 8.9–10.3)
Chloride: 110 mmol/L (ref 98–111)
Creatinine, Ser: 1.17 mg/dL — ABNORMAL HIGH (ref 0.44–1.00)
GFR, Estimated: 50 mL/min — ABNORMAL LOW (ref >60)
Glucose, Bld: 72 mg/dL (ref 70–99)
Potassium: 3.3 mmol/L — ABNORMAL LOW (ref 3.5–5.1)
Sodium: 138 mmol/L (ref 135–145)

## 2021-07-10 LAB — MAGNESIUM: Magnesium: 1.7 mg/dL (ref 1.7–2.4)

## 2021-07-10 LAB — CBC
HCT: 23.6 % — ABNORMAL LOW (ref 36.0–46.0)
Hemoglobin: 7.4 g/dL — ABNORMAL LOW (ref 12.0–15.0)
MCH: 27.4 pg (ref 26.0–34.0)
MCHC: 31.4 g/dL (ref 30.0–36.0)
MCV: 87.4 fL (ref 80.0–100.0)
Platelets: 264 10*3/uL (ref 150–400)
RBC: 2.7 MIL/uL — ABNORMAL LOW (ref 3.87–5.11)
RDW: 18 % — ABNORMAL HIGH (ref 11.5–15.5)
WBC: 10.1 10*3/uL (ref 4.0–10.5)
nRBC: 0 % (ref 0.0–0.2)

## 2021-07-10 LAB — BPAM RBC
Blood Product Expiration Date: 202209152359
ISSUE DATE / TIME: 202208211350
Unit Type and Rh: 7300

## 2021-07-10 MED ORDER — SODIUM CHLORIDE 0.9 % IV SOLN: INTRAVENOUS | Status: AC

## 2021-07-10 MED ORDER — POTASSIUM CHLORIDE CRYS ER 20 MEQ PO TBCR
40.0000 meq | EXTENDED_RELEASE_TABLET | ORAL | Status: AC
  Administered 2021-07-10 (×2): 40 meq via ORAL
  Filled 2021-07-10 (×3): qty 2

## 2021-07-10 MED ORDER — MAGNESIUM OXIDE -MG SUPPLEMENT 400 (240 MG) MG PO TABS
800.0000 mg | ORAL_TABLET | ORAL | Status: AC
  Administered 2021-07-10 (×2): 800 mg via ORAL
  Filled 2021-07-10 (×2): qty 2

## 2021-07-10 NOTE — TOC Initial Note (Signed)
Transition of Care [facility]) - Initial/Assessment Note    Patient Details  Name: NOLENE STAHL MRN: [id number] Date of Birth: 08-28-1946  Transition of Care [facility]) CM/SW Contact:    Beverly Sessions, RN Phone Number: 07/10/2021, 5:27 PM  Clinical Narrative:                 Patient admitted form home with sepsis  Patient lives in her own home.  2 daughters and caregiver rotate caring for her, but someone is in the home wit her 24/7  Patient is bed bound and has been for quite some time  PCP Dr making house calls  Patient declines hospital bed for home.  Patient states that she has a queen size adjustable bed in the home and prefers to continue to sleep in that  Patient agreeable to home health services.  Her preference is [facility].  States if [facility] is unable to accept she does not have a preference of agency.   [facility] unable to accept referral .  Referral made with [place] at [facility] home health orders faxed.   Patient will require EMS transport at discharge  Expected Discharge Plan: [place] Barriers to Discharge: Continued Medical Work up   Patient Goals and CMS Choice        Expected Discharge Plan and Services Expected Discharge Plan: [place]   Discharge Planning Services: CM Consult   Living arrangements for the past 2 months: Single Family Home                           HH Arranged: RN, OT [facility] Agency: [place] Date [place]: 07/10/21   Representative spoke with at [place]: [place]  Prior Living Arrangements/Services Living arrangements for the past 2 months: [place] with:: Adult Children Patient language and need for interpreter reviewed:: Yes Do you feel safe going back to the place where you live?: Yes      Need for Family Participation in Patient Care: Yes (Comment) Care giver support system in place?: Yes (comment) Current home services: DME Criminal Activity/Legal  Involvement Pertinent to Current Situation/Hospitalization: No - Comment as needed  Activities of Daily Living Home Assistive Devices/Equipment: Other (Comment) Estate manager/land agent) ADL Screening (condition at time of admission) Patient's cognitive ability adequate to safely complete daily activities?: Yes Is the patient deaf or have difficulty hearing?: No Does the patient have difficulty seeing, even when wearing glasses/contacts?: Yes Does the patient have difficulty concentrating, remembering, or making decisions?: Yes Patient able to express need for assistance with ADLs?: Yes Does the patient have difficulty dressing or bathing?: Yes Independently performs ADLs?: No Communication: Independent Dressing (OT): Dependent Is this a change from baseline?: Pre-admission baseline Grooming: Dependent Is this a change from baseline?: Pre-admission baseline Feeding: Independent Bathing: Dependent Is this a change from baseline?: Pre-admission baseline Toileting: Needs assistance Is this a change from baseline?: Pre-admission baseline In/Out Bed: Dependent Is this a change from baseline?: Pre-admission baseline Walks in Home: Dependent Is this a change from baseline?: Pre-admission baseline Does the patient have difficulty walking or climbing stairs?: Yes Weakness of Legs: Both Weakness of Arms/Hands: Both  Permission Sought/Granted                  Emotional Assessment       Orientation: : Oriented to Self, Oriented to Place, Oriented to  Time, Oriented to Situation Alcohol / Substance Use:  Not Applicable Psych Involvement: No (comment)  Admission diagnosis:  Sepsis associated hypotension (HCC) [A41.9, I95.9] Sepsis ([place]) [A41.9] Urinary tract infection without hematuria, site unspecified [N39.0] Altered mental status, unspecified altered mental status type [R41.82] Pressure injury of skin of buttock, unspecified injury stage, unspecified laterality [L89.309] Sepsis, due to  unspecified organism, unspecified whether acute organ dysfunction present [facility]) [A41.9] Patient Active Problem List   Diagnosis Date Noted   Palliative care by specialist    Sepsis ([place]) 07/01/2021   Sepsis associated hypotension ([place]) 06/30/2021   History of hypertension 06/30/2021   Lymphedema of upper extremity, bilateral 06/30/2021   Morbid obesity with BMI of 50.0-59.9, adult ([place]) 06/30/2021   IBS (irritable bowel syndrome) 06/30/2021   Sepsis secondary to UTI ([place]) 06/30/2021   ANKLE PAIN, LEFT 06/17/2009   OBESITY-MORBID (>100') 04/25/2009   NEOPLASM UNSPEC NATURE OTH GENITOURINARY ORGANS 08/30/2008   LEG EDEMA 06/07/2008   HYPERLIPIDEMIA 06/10/2007   NEUROPATHY 06/10/2007   HYPERTENSION 06/10/2007   IBS 06/10/2007   ARTHRITIS, SPINE 06/10/2007   SPINAL STENOSIS 06/10/2007   PCP:  No primary care provider on file. Pharmacy:   [facility] [address] ([address]), [place] [address] [place]) [place] [address] Phone: [phone] Fax: [phone]     Social Determinants of Health (SDOH) Interventions    Readmission Risk Interventions No flowsheet data found.

## 2021-07-10 NOTE — Progress Notes (Signed)
   07/10/21 1823  Clinical Encounter Type  Visited With Patient  Visit Type Follow-up  Referral From Nurse  Consult/Referral To Chaplain   OR received to assist the patient with the completion of AD documentation. Upon arrival, the patient was observed to be awake, alert, and oriented. She confirmed her interest in finalizing her AD documents. Paperwork complete at this time. This chaplain made the patient aware that during business hours we will have the best opportunity to secure the necessary persons (notary and witnesses) to complete the process. I offered to relay her needs to the oncoming chaplain to attempt completion on 07/11/21. The patient expressed understanding and gratitude for the visit and support.  Gennaro Africa, Chaplain

## 2021-07-10 NOTE — Care Management Important Message (Signed)
Important Message  Patient Details  Name: LAUREA TITLOW MRN: [id number] Date of Birth: [birth year]   Medicare Important Message Given:  Yes     Dannette Barbara 07/10/2021, 12:57 PM

## 2021-07-10 NOTE — Progress Notes (Signed)
PROGRESS NOTE    Kayla Long  [id number] DOB: 1946-04-26 DOA: 06/30/2021 PCP: No primary care provider on file.   Brief Narrative:  75 yo with pmhx of Morbid obesity, HTN, B/l Lymphedema, Chronically bedbound presents with chills and lethargy.  She was found to have sepsis secondary to UTI which turned into Citrobacter.  ID was consulted, IV Zosyn was changed to p.o. Levaquin.  Renal ultrasound was negative.  Started on midodrine due to soft blood pressures.  Required 1 unit PRBC on 8/21.   Assessment & Plan:   Principal Problem:   Sepsis secondary to UTI [facility]) Active Problems:   Sepsis associated hypotension ([place])   History of hypertension   Lymphedema of upper extremity, bilateral   Morbid obesity with BMI of 50.0-59.9, adult (HCC)   IBS (irritable bowel syndrome)   Sepsis ([place])   Palliative care by specialist   Sepsis secondary to Citrobacter koseri from urinary tract infection - Sepsis physiology is improving.  Seen by infectious disease - Antibiotics-IV Zosyn ->  PO Levaquin 8/21 - Renal ultrasound- nothing acute.  - Midodrine TID   UTI: UA is positive. Repeat urine cx shows insignificant growth.  Continue p.o. Levaquin US renal no acute findings    Unstageable sacral decubitus ulcer-continue w/ wound care and frequent turning every 2 hourly   ACD: Status post 1 unit PRBC 8/21.  Hemoglobin today 7.4   Folate deficiency-folic acid daily  Vitamin D deficiency-supplements ordered   Hypotension.  History of essential hypertension-Norvasc is on hold.  Continue midodrine 10 mg 3 times daily.  Getting IV fluids, monitor respiratory symptoms.   Depression-citalopram   Morbid obesity: BMI 69.8. Complicates overall care & prognosis    Severe Lymphedema: of b/l LE, chronic skin changes -Routine wound care.   Body mass index is 69.85 kg/m.   Will need transport at home.    DVT prophylaxis: Lovenox Code Status: Full code Family Communication: Daughter is  present at bedside  Status is: Inpatient  Remains inpatient appropriate because:Inpatient level of care appropriate due to severity of illness  Dispo: The patient is from: Home              Anticipated d/c is to: Home              Patient currently is not medically stable to d/c.  Blood pressure is still soft today with poor oral intake requiring IV fluids.  Not safe for discharge today.   Difficult to place patient No  Subjective: Patient did not have any complaints this morning.  Denies any chest pain, lightheadedness, dizziness.  Patient and daughter would like her to go home tomorrow if possible if she is medically stable.  Review of Systems Otherwise negative except as per HPI, including: General: Denies fever, chills, night sweats or unintended weight loss. Resp: Denies cough, wheezing, shortness of breath. Cardiac: Denies chest pain, palpitations, orthopnea, paroxysmal nocturnal dyspnea. GI: Denies abdominal pain, nausea, vomiting, diarrhea or constipation GU: Denies dysuria, frequency, hesitancy or incontinence MS: Denies muscle aches, joint pain or swelling Neuro: Denies headache, neurologic deficits (focal weakness, numbness, tingling), abnormal gait Psych: Denies anxiety, depression, SI/HI/AVH Skin: Denies new rashes or lesions ID: Denies sick contacts, exotic exposures, travel  Examination:  Constitutional: Not in acute distress Respiratory: Clear to auscultation bilaterally Cardiovascular: Normal sinus rhythm, no rubs Abdomen: Nontender nondistended good bowel sounds Musculoskeletal: No edema noted Skin: No rashes seen Neurologic: CN 2-12 grossly intact.  And nonfocal Psychiatric: Normal judgment and insight. Alert  and oriented x 3. Normal mood.  Objective: Vitals:   07/09/21 1619 07/09/21 1708 07/09/21 1959 07/10/21 0459  BP: (!) 103/47 (!) 100/47 (!) 90/46 (!) 92/48  Pulse: 97 91 90 91  Resp:  '16 20 18  '$ Temp:  98.2 F (36.8 C) 98.3 F (36.8 C) 98.4 F  (36.9 C)  TempSrc:  Oral Oral Oral  SpO2:  100% 100% 99%  Weight:      Height:        Intake/Output Summary (Last 24 hours) at 07/10/2021 0817 Last data filed at 07/10/2021 0505 Gross per 24 hour  Intake 1370.83 ml  Output 1250 ml  Net 120.83 ml   Filed Weights   06/30/21 1821 07/01/21 1628  Weight: (!) 150 kg (!) 196.3 kg     Data Reviewed:   CBC: Recent Labs  Lab 07/06/21 0453 07/07/21 0416 07/08/21 0516 07/09/21 0512 07/09/21 1922 07/10/21 0517  WBC 14.3* 13.5* 12.0* 11.8*  --  10.1  HGB 7.3* 7.3* 7.3* 6.9* 7.6* 7.4*  HCT 22.9* 22.8* 22.2* 22.0* 23.3* 23.6*  MCV 87.1 84.8 85.7 85.9  --  87.4  PLT 211 217 233 248  --  [id number]   Basic Metabolic Panel: Recent Labs  Lab 07/04/21 0532 07/05/21 0622 07/06/21 0453 07/07/21 0416 07/08/21 0516 07/09/21 0512 07/10/21 0517  NA 136 136 138 137 137 137 138  K 3.4* 3.3* 3.3* 3.5 3.6 3.4* 3.3*  CL 105 104 106 107 106 108 110  CO2 '26 24 26 24 24 25 23  '$ GLUCOSE 82 74 75 85 74 76 72  BUN '18 18 18 19 19 18 17  '$ CREATININE 1.12* 1.05* 1.16* 1.17* 1.19* 1.12* 1.17*  CALCIUM 7.2* 7.0* 7.1* 6.9* 6.9* 6.8* 6.8*  MG 1.7 1.8 1.8 1.8 1.8  --  1.7  PHOS 2.3* 2.3* 2.9 3.1 3.0  --   --    GFR: Estimated Creatinine Clearance: 78.3 mL/min (A) (by C-G formula based on SCr of 1.17 mg/dL (H)). Liver Function Tests: Recent Labs  Lab 07/04/21 0532 07/05/21 0622  AST 12* 13*  ALT 7 6  ALKPHOS 98 96  BILITOT 0.4 0.6  PROT 5.4* 5.3*  ALBUMIN 1.3* 1.5*   No results for input(s): LIPASE, AMYLASE in the last 168 hours. No results for input(s): AMMONIA in the last 168 hours. Coagulation Profile: No results for input(s): INR, PROTIME in the last 168 hours. Cardiac Enzymes: No results for input(s): CKTOTAL, CKMB, CKMBINDEX, TROPONINI in the last 168 hours. BNP (last 3 results) No results for input(s): PROBNP in the last 8760 hours. HbA1C: No results for input(s): HGBA1C in the last 72 hours. CBG: No results for input(s): GLUCAP in  the last 168 hours. Lipid Profile: No results for input(s): CHOL, HDL, LDLCALC, TRIG, CHOLHDL, LDLDIRECT in the last 72 hours. Thyroid Function Tests: No results for input(s): TSH, T4TOTAL, FREET4, T3FREE, THYROIDAB in the last 72 hours. Anemia Panel: No results for input(s): VITAMINB12, FOLATE, FERRITIN, TIBC, IRON, RETICCTPCT in the last 72 hours.  Sepsis Labs: No results for input(s): PROCALCITON, LATICACIDVEN in the last 168 hours.  Recent Results (from the past 240 hour(s))  Resp Panel by RT-PCR (Flu A&B, Covid) Nasopharyngeal Swab     Status: None   Collection Time: 06/30/21  6:30 PM   Specimen: Nasopharyngeal Swab; Nasopharyngeal(NP) swabs in vial transport medium  Result Value Ref Range Status   SARS Coronavirus 2 by RT PCR NEGATIVE NEGATIVE Final    Comment: (NOTE) SARS-CoV-2 target nucleic acids are NOT  DETECTED.  The SARS-CoV-2 RNA is generally detectable in upper respiratory specimens during the acute phase of infection. The lowest concentration of SARS-CoV-2 viral copies this assay can detect is 138 copies/mL. A negative result does not preclude SARS-Cov-2 infection and should not be used as the sole basis for treatment or other patient management decisions. A negative result may occur with  improper specimen collection/handling, submission of specimen other than nasopharyngeal swab, presence of viral mutation(s) within the areas targeted by this assay, and inadequate number of viral copies(<138 copies/mL). A negative result must be combined with clinical observations, patient history, and epidemiological information. The expected result is Negative.  Fact Sheet for Patients:  EntrepreneurPulse.com.au  Fact Sheet for Healthcare Providers:  IncredibleEmployment.be  This test is no t yet approved or cleared by the [place] FDA and  has been authorized for detection and/or diagnosis of SARS-CoV-2 by FDA under an Emergency Use  Authorization (EUA). This EUA will remain  in effect (meaning this test can be used) for the duration of the COVID-19 declaration under Section 564(b)(1) of the Act, 21 U.S.C.section 360bbb-3(b)(1), unless the authorization is terminated  or revoked sooner.       Influenza A by PCR NEGATIVE NEGATIVE Final   Influenza B by PCR NEGATIVE NEGATIVE Final    Comment: (NOTE) The Xpert Xpress SARS-CoV-2/FLU/RSV plus assay is intended as an aid in the diagnosis of influenza from Nasopharyngeal swab specimens and should not be used as a sole basis for treatment. Nasal washings and aspirates are unacceptable for Xpert Xpress SARS-CoV-2/FLU/RSV testing.  Fact Sheet for Patients: EntrepreneurPulse.com.au  Fact Sheet for Healthcare Providers: IncredibleEmployment.be  This test is not yet approved or cleared by the [place] FDA and has been authorized for detection and/or diagnosis of SARS-CoV-2 by FDA under an Emergency Use Authorization (EUA). This EUA will remain in effect (meaning this test can be used) for the duration of the COVID-19 declaration under Section 564(b)(1) of the Act, 21 U.S.C. section 360bbb-3(b)(1), unless the authorization is terminated or revoked.  Performed at [facility], [address] [place], [place] [address]   Blood Culture (routine x 2)     Status: Abnormal   Collection Time: 06/30/21  6:30 PM   Specimen: BLOOD  Result Value Ref Range Status   Specimen Description   Final    BLOOD RIGHT ANTECUBITAL Performed at [facility], [address] [place], [place] [address]    Special Requests   Final    BOTTLES DRAWN AEROBIC AND ANAEROBIC Blood Culture results may not be optimal due to an inadequate volume of blood received in culture bottles Performed at [facility], [address], [place], [place] [address]    Culture  Setup Time   Final    GRAM NEGATIVE RODS IN BOTH AEROBIC AND  ANAEROBIC BOTTLES CRITICAL RESULT CALLED TO, READ BACK BY AND VERIFIED WITH: ALEX CHAPPELL AT [place] 07/01/21.PMF Performed at [facility], [place] [address] [place], [place] [address]    Culture CITROBACTER KOSERI (A)  Final   Report Status 07/03/2021 FINAL  Final   Organism ID, Bacteria CITROBACTER KOSERI  Final      Susceptibility   Citrobacter koseri - MIC*    CEFAZOLIN <=4 SENSITIVE Sensitive     CEFEPIME <=0.12 SENSITIVE Sensitive     CEFTAZIDIME <=1 SENSITIVE Sensitive     CEFTRIAXONE <=0.25 SENSITIVE Sensitive     CIPROFLOXACIN <=0.25 SENSITIVE Sensitive     GENTAMICIN <=1 SENSITIVE Sensitive  IMIPENEM <=0.25 SENSITIVE Sensitive     TRIMETH/SULFA <=20 SENSITIVE Sensitive     PIP/TAZO <=4 SENSITIVE Sensitive     * CITROBACTER KOSERI  Blood Culture ID Panel (Reflexed)     Status: Abnormal   Collection Time: 06/30/21  6:30 PM  Result Value Ref Range Status   Enterococcus faecalis NOT DETECTED NOT DETECTED Final   Enterococcus Faecium NOT DETECTED NOT DETECTED Final   Listeria monocytogenes NOT DETECTED NOT DETECTED Final   Staphylococcus species NOT DETECTED NOT DETECTED Final   Staphylococcus aureus (BCID) NOT DETECTED NOT DETECTED Final   Staphylococcus epidermidis NOT DETECTED NOT DETECTED Final   Staphylococcus lugdunensis NOT DETECTED NOT DETECTED Final   Streptococcus species NOT DETECTED NOT DETECTED Final   Streptococcus agalactiae NOT DETECTED NOT DETECTED Final   Streptococcus pneumoniae NOT DETECTED NOT DETECTED Final   Streptococcus pyogenes NOT DETECTED NOT DETECTED Final   A.calcoaceticus-baumannii NOT DETECTED NOT DETECTED Final   Bacteroides fragilis NOT DETECTED NOT DETECTED Final   Enterobacterales DETECTED (A) NOT DETECTED Final    Comment: Enterobacterales represent a large order of gram negative bacteria, not a single organism. Refer to culture for further identification. CRITICAL RESULT CALLED TO, READ BACK BY AND VERIFIED WITH: ALEX CHAPPELL AT  [place] 07/01/21.PMF    Enterobacter cloacae complex NOT DETECTED NOT DETECTED Final   Escherichia coli NOT DETECTED NOT DETECTED Final   Klebsiella aerogenes NOT DETECTED NOT DETECTED Final   Klebsiella oxytoca NOT DETECTED NOT DETECTED Final   Klebsiella pneumoniae NOT DETECTED NOT DETECTED Final   Proteus species NOT DETECTED NOT DETECTED Final   Salmonella species NOT DETECTED NOT DETECTED Final   Serratia marcescens NOT DETECTED NOT DETECTED Final   Haemophilus influenzae NOT DETECTED NOT DETECTED Final   Neisseria meningitidis NOT DETECTED NOT DETECTED Final   Pseudomonas aeruginosa NOT DETECTED NOT DETECTED Final   Stenotrophomonas maltophilia NOT DETECTED NOT DETECTED Final   Candida albicans NOT DETECTED NOT DETECTED Final   Candida auris NOT DETECTED NOT DETECTED Final   Candida glabrata NOT DETECTED NOT DETECTED Final   Candida krusei NOT DETECTED NOT DETECTED Final   Candida parapsilosis NOT DETECTED NOT DETECTED Final   Candida tropicalis NOT DETECTED NOT DETECTED Final   Cryptococcus neoformans/gattii NOT DETECTED NOT DETECTED Final   CTX-M ESBL NOT DETECTED NOT DETECTED Final   Carbapenem resistance IMP NOT DETECTED NOT DETECTED Final   Carbapenem resistance KPC NOT DETECTED NOT DETECTED Final   Carbapenem resistance NDM NOT DETECTED NOT DETECTED Final   Carbapenem resist OXA 48 LIKE NOT DETECTED NOT DETECTED Final   Carbapenem resistance VIM NOT DETECTED NOT DETECTED Final    Comment: Performed at [facility], [address] [place], [place] [address]  Blood Culture (routine x 2)     Status: Abnormal   Collection Time: 06/30/21  6:35 PM   Specimen: BLOOD  Result Value Ref Range Status   Specimen Description   Final    BLOOD LEFT ANTECUBITAL Performed at [facility], [address] [place], [place] [address]    Special Requests   Final    BOTTLES DRAWN AEROBIC AND ANAEROBIC Blood Culture adequate volume Performed at [facility], [address] [place], [place] [address]    Culture  Setup Time   Final    GRAM NEGATIVE RODS IN BOTH AEROBIC AND ANAEROBIC BOTTLES CRITICAL VALUE NOTED.  VALUE IS CONSISTENT WITH PREVIOUSLY REPORTED AND CALLED VALUE.    Culture (A)  Final  CITROBACTER KOSERI SUSCEPTIBILITIES PERFORMED ON PREVIOUS CULTURE WITHIN THE LAST 5 DAYS. Performed at [facility], [place] [address] [place], [place] [address]    Report Status 07/03/2021 FINAL  Final  Urine Culture     Status: Abnormal   Collection Time: 06/30/21  8:00 PM   Specimen: In/Out Cath Urine  Result Value Ref Range Status   Specimen Description   Final    IN/OUT CATH URINE Performed at [facility], [address], [place], [place] [address]    Special Requests   Final    NONE Performed at [facility], [address] [place], [place] [address]    Culture MULTIPLE SPECIES PRESENT, SUGGEST RECOLLECTION (A)  Final   Report Status 07/02/2021 FINAL  Final  Urine Culture     Status: Abnormal   Collection Time: 07/02/21  8:13 AM   Specimen: Urine, Random  Result Value Ref Range Status   Specimen Description   Final    URINE, RANDOM Performed at [facility], [address] [place], [place] [address]    Special Requests   Final    NONE Performed at [facility], [address] [place], [place] [address]    Culture (A)  Final    <10,000 COLONIES/mL INSIGNIFICANT GROWTH Performed at [facility], [place] [address], [place], [place] [address]    Report Status 07/04/2021 FINAL  Final  CULTURE, BLOOD (ROUTINE X 2) w Reflex to ID Panel     Status: None   Collection Time: 07/03/21  9:53 AM   Specimen: BLOOD  Result Value Ref Range Status   Specimen Description BLOOD  LT [facility]  Final   Special Requests   Final    BOTTLES DRAWN AEROBIC AND ANAEROBIC Blood Culture adequate volume   Culture   Final    NO GROWTH 5 DAYS Performed at [facility], [address]  [place], [place] [address]    Report Status 07/08/2021 FINAL  Final  CULTURE, BLOOD (ROUTINE X 2) w Reflex to ID Panel     Status: None   Collection Time: 07/03/21 10:05 AM   Specimen: BLOOD  Result Value Ref Range Status   Specimen Description BLOOD  LT HAND  Final   Special Requests   Final    BOTTLES DRAWN AEROBIC AND ANAEROBIC Blood Culture results may not be optimal due to an inadequate volume of blood received in culture bottles   Culture   Final    NO GROWTH 5 DAYS Performed at [facility], [address], [place],  [address]    Report Status 07/08/2021 FINAL  Final         Radiology Studies: No results found.      Scheduled Meds:  ammonium lactate   Topical BID   vitamin C  500 mg Oral Daily   clotrimazole-betamethasone   Topical BID   enoxaparin (LOVENOX) injection  0.5 mg/kg Subcutaneous Q24H   feeding supplement  237 mL Oral TID   folic acid  1 mg Oral Daily   iron polysaccharides  150 mg Oral Daily   levofloxacin  500 mg Oral Daily   midodrine  10 mg Oral TID WC   pantoprazole  40 mg Oral Daily   PARoxetine  30 mg Oral Daily   Vitamin D (Ergocalciferol)  50,000 Units Oral Q7 days   zinc sulfate  220 mg Oral Daily   Continuous Infusions:  sodium chloride  LOS: 10 days   Time spent= 35 mins    Shaquala Broeker Arsenio Loader, MD [facility]  If 7PM-7AM, please contact night-coverage  07/10/2021, 8:17 AM

## 2021-07-10 NOTE — Consult Note (Signed)
Palliative Care Consult Note                                  Date: 07/10/2021   Patient Name: Kayla Long  DOB: June 06, 1946  MRN: [id number]  Age / Sex: 75 y.o., female  PCP: No primary care provider on file. Referring Physician: Damita Lack, MD  Reason for Consultation: Establishing goals of care  HPI/Patient Profile: Palliative Care consult requested for goals of care discussion in this 75 y.o. female  with past medical history of  morbid obesity, hyperlipidemia, IBS, spinal stenosis, hypertension, and bilateral lymphedema. She was admitted on 06/30/2021 via EMS from home with complaints of chills and lethargy.  During work-up fount to have sepsis secondary to UTI and started on antibiotics. She is being followed by ID.   Past Medical History:  Diagnosis Date  . Hx of colonic polyps    tubulovillous adenoma  . Hyperlipidemia   . IBS (irritable bowel syndrome)   . Spinal stenosis    with secondary neuropathy     Subjective:   This NP Osborne Oman reviewed medical records, received report from team, assessed the patient and then met at the patient's bedside with patient, her daughter, Kayla Long, and private caregiver to discuss diagnosis, prognosis, GOC, EOL wishes disposition and options.  Patient is awake, alert and oriented. She is sitting up in bed watching tv and eating.    Concept of Palliative Care was introduced as specialized medical care for people and their families living with serious illness.  It focuses on providing relief from the symptoms and stress of a serious illness.  The goal is to improve quality of life for both the patient and the family. Values and goals of care important to patient and family were attempted to be elicited.  Patient's daughter shared that patient was initially upset and not interested in palliative discussions as she felt the medical team was "giving up" on her. She states she is not  interested in hospice discussions. [name] is polite and open to further discussions after education provided on the differences between Palliative and hospice.   I created space and opportunity for patient and family to explore state of health prior to admission, thoughts, and feelings. Patient resides in the home with hired caregiver. Her 2 daughters are largely involved in her care and both live within 5 minutes of her.   Patient shares she has been bedbound for the past 3 years due to her severe lymphedema. She mainly stays in the home with MD home visits. She requires assistance with ADLs. Reports appetite has been good. No complications with taking or receiving medications.   We discussed Her current illness and what it means in the larger context of Her on-going co-morbidities. Natural disease trajectory and expectations were discussed.  Patient and daughter verbalized understanding of current illness and co-morbidities. They both mutually expressed although patient's health has been challenging they feel she has a good quality of life.   Patient and her daughter, Kayla Long are clear in expressed goals to continue to treat the treatable aggressively. She is remaining hopeful for improvement with plans to return home with caregiver and family support. She is not interested in forms of facility placement.   I discussed the importance of continued conversation with family and their medical providers regarding overall plan of care and treatment options, ensuring decisions are within the context of the  patients values and GOCs.  Questions and concerns were addressed. The family was encouraged to call with questions or concerns.  PMT will continue to support holistically as needed.  Life Review: Patient is retired from the [employer]. She became disabled due to health complications. She has 2 daughters. Lives in the home with 24/7 caregiver. Her daughters are involved in her care and live  within 5 minutes of patient. Christian faith. Enjoys spending time with family and watching tv.    Objective:   Primary Diagnoses: Present on Admission: . Sepsis associated hypotension ([employer]) . Sepsis secondary to UTI ([place]) . Sepsis ([place])   Scheduled Meds: . ammonium lactate   Topical BID  . vitamin C  500 mg Oral Daily  . clotrimazole-betamethasone   Topical BID  . enoxaparin (LOVENOX) injection  0.5 mg/kg Subcutaneous Q24H  . feeding supplement  237 mL Oral TID  . folic acid  1 mg Oral Daily  . iron polysaccharides  150 mg Oral Daily  . magnesium oxide  800 mg Oral Q4H  . midodrine  10 mg Oral TID WC  . pantoprazole  40 mg Oral Daily  . PARoxetine  30 mg Oral Daily  . potassium chloride  40 mEq Oral Q4H  . Vitamin D (Ergocalciferol)  50,000 Units Oral Q7 days  . zinc sulfate  220 mg Oral Daily    Continuous Infusions: . sodium chloride 100 mL/hr at 07/10/21 0833    PRN Meds: acetaminophen **OR** acetaminophen, dextromethorphan-guaiFENesin, hydrALAZINE, metoprolol tartrate, ondansetron **OR** ondansetron (ZOFRAN) IV, oxyCODONE-acetaminophen, phenol, senna-docusate  Allergies  Allergen Reactions  . Fluoxetine Hcl     REACTION: headache    Review of Systems  Cardiovascular:  Positive for leg swelling.       Chronic lymphedema   Neurological:  Positive for weakness.  Unless otherwise noted, a complete review of systems is negative.  Physical Exam General: NAD, morbidly obese, chronically-ill appearing Cardiovascular: regular rate and rhythm Pulmonary: diminished bilaterally  Abdomen: soft, nontender, + bowel sounds, obese  Extremities: bilateral lower extremity lymphedema (chronic) Neurological: AAO x3, mood appropriate   Vital Signs:  BP (!) 91/50 (BP Location: Left Arm)   Pulse 94   Temp 97.9 F (36.6 C) (Oral)   Resp 18   Ht 5' 6"  (1.676 m)   Wt (!) 196.3 kg   SpO2 99%   BMI 69.85 kg/m  Pain Scale: 0-10 POSS *See Group Information*:  S-Acceptable,Sleep, easy to arouse Pain Score: Asleep  SpO2: SpO2: 99 % O2 Device:SpO2: 99 % O2 Flow Rate: .   IO: Intake/output summary:  Intake/Output Summary (Last 24 hours) at 07/10/2021 1148 Last data filed at 07/10/2021 0900 Gross per 24 hour  Intake 1490.83 ml  Output 1250 ml  Net 240.83 ml    LBM: Last BM Date: 07/09/21 Baseline Weight: Weight: (!) 150 kg Most recent weight: Weight: (!) 196.3 kg      Palliative Assessment/Data: PPS 30%   Advanced Care Planning:   Primary Decision Maker: NEXT OF KIN -Kayla Long  Code Status/Advance Care Planning: Full code  A discussion was had today regarding advanced directives. Concepts specific to code status, artifical feeding and hydration, continued IV antibiotics and rehospitalization was had.    Patient states she does not have an advanced directive however she and her daughter has been discussing. Patient wishes to name her daughter, Kayla Long as her medical decision maker. Would like assistance in completing while hospitalized. Education provided on referral process. Documents have been provided to  review and complete appropriate areas prior to finalizing.   I discussed at length patient's full code status with consideration to her current illness and co-morbidities. Patient and daughter verbalized understanding expressing wishes to remain a full code. Patient states she would like to at least attempt and allow her daughter to make needed decisions based on the situation. She would not want to be artificially kept alive if in a vegetative state.  The MOST form was introduced and discussed. Daughter states they will consider completion, but declines for today.   Hospice and Palliative Care services outpatient were explained and offered. Patient and family verbalized their understanding and awareness of both palliative and hospice's goals and philosophy of care. Patient emphasis she is not interested in hospice. They wish  to further discuss outpatient palliative. Advised this can be arranged at anytime by discussing with medical team.    Assessment & Plan:   SUMMARY OF RECOMMENDATIONS   Full Code-as confirmed by patient and daughter Continue with current plan of care, aggressive interventions Patient and her daughter is clear with expressed goals to continue to treat the treatable, they are remaining hopeful for stability with a goal of returning home with caregiver/family support.  Patient does not have an advanced directive and wishes to complete while hospitalized. She states plans to name her daughter Kayla Long as her HCPOA. Documents provided at the bedside and patient plans to complete necessary areas prior to finalizing. Spiritual Care consult placed for assistance.  Patient wishes to further discuss with family regarding outpatient Palliative support. They are aware services can be initiated at anytime.  PMT will continue to support and follow as needed. Goals are clear and set. Please call team line with urgent needs.  Symptom Management:  Per Attending  Palliative Prophylaxis:  Aspiration, Frequent Pain Assessment, Oral Care, Palliative Wound Care, and Turn Reposition  Additional Recommendations (Limitations, Scope, Preferences): Full Scope Treatment  Psycho-social/Spiritual:  Desire for further Chaplaincy support: yes Additional Recommendations:  ONgoing support and discussions  Prognosis:  Guarded  Discharge Planning:  Patient and daughter express plans to return home with caregiver/family support.     Patient and daughter, Kayla Long expressed understanding and was in agreement with this plan.   Time In: 1155 Time Out: 1245 Time Total: 50 min   Visit consisted of counseling and education dealing with the complex and emotionally intense issues of symptom management and palliative care in the setting of serious and potentially life-threatening illness.Greater than 50%  of this time  was spent counseling and coordinating care related to the above assessment and plan.  Signed by:  Alda Lea, AGPCNP-BC Palliative Medicine Team  Phone: [phone] Pager: [phone] Amion: Bjorn Pippin   Thank you for allowing the Palliative Medicine Team to assist in the care of this patient. Please utilize secure chat with additional questions, if there is no response within 30 minutes please call the above phone number. Palliative Medicine Team providers are available by phone from 7am to 5pm daily and can be reached through the team cell phone.  Should this patient require assistance outside of these hours, please call the patient's attending physician.

## 2021-07-11 LAB — BASIC METABOLIC PANEL
Anion gap: 7 (ref 5–15)
BUN: 16 mg/dL (ref 8–23)
CO2: 22 mmol/L (ref 22–32)
Calcium: 7.1 mg/dL — ABNORMAL LOW (ref 8.9–10.3)
Chloride: 108 mmol/L (ref 98–111)
Creatinine, Ser: 1.13 mg/dL — ABNORMAL HIGH (ref 0.44–1.00)
GFR, Estimated: 51 mL/min — ABNORMAL LOW (ref >60)
Glucose, Bld: 76 mg/dL (ref 70–99)
Potassium: 3.5 mmol/L (ref 3.5–5.1)
Sodium: 137 mmol/L (ref 135–145)

## 2021-07-11 LAB — CBC
HCT: 23.7 % — ABNORMAL LOW (ref 36.0–46.0)
Hemoglobin: 7.7 g/dL — ABNORMAL LOW (ref 12.0–15.0)
MCH: 28.5 pg (ref 26.0–34.0)
MCHC: 32.5 g/dL (ref 30.0–36.0)
MCV: 87.8 fL (ref 80.0–100.0)
Platelets: 273 10*3/uL (ref 150–400)
RBC: 2.7 MIL/uL — ABNORMAL LOW (ref 3.87–5.11)
RDW: 17.9 % — ABNORMAL HIGH (ref 11.5–15.5)
WBC: 9.4 10*3/uL (ref 4.0–10.5)
nRBC: 0 % (ref 0.0–0.2)

## 2021-07-11 LAB — MAGNESIUM: Magnesium: 1.7 mg/dL (ref 1.7–2.4)

## 2021-07-11 MED ORDER — PANTOPRAZOLE SODIUM 40 MG PO TBEC: 40.0000 mg | DELAYED_RELEASE_TABLET | Freq: Every day | ORAL | 0 refills | Status: DC

## 2021-07-11 MED ORDER — POLYSACCHARIDE IRON COMPLEX 150 MG PO CAPS: 150.0000 mg | ORAL_CAPSULE | Freq: Every day | ORAL | 0 refills | Status: DC

## 2021-07-11 MED ORDER — FOLIC ACID 1 MG PO TABS: 1.0000 mg | ORAL_TABLET | Freq: Every day | ORAL | 0 refills | Status: DC

## 2021-07-11 MED ORDER — MIDODRINE HCL 10 MG PO TABS: 10.0000 mg | ORAL_TABLET | Freq: Three times a day (TID) | ORAL | 0 refills | Status: DC | PRN

## 2021-07-11 MED ORDER — SENNOSIDES-DOCUSATE SODIUM 8.6-50 MG PO TABS: 1.0000 | ORAL_TABLET | Freq: Every evening | ORAL | 0 refills | Status: DC | PRN

## 2021-07-11 NOTE — TOC Transition Note (Signed)
Transition of Care [facility]) - CM/SW Discharge Note   Patient Details  Name: Kayla Long MRN: [id number] Date of Birth: [birth year]  Transition of Care [facility]) CM/SW Contact:  Beverly Sessions, RN Phone Number: 07/11/2021, 10:32 AM   Clinical Narrative:     Patient will DC [id number] with home health services with [facility]  Anticipated DC date: 07/11/21 Family notified:Daughter Transport by:EMS transport called   TOC signing off.  Isaias [facility] [phone]     Barriers to Discharge: Continued Medical Work up   Patient Goals and CMS Choice        Discharge Placement                       Discharge Plan and Services   Discharge Planning Services: CM Consult                      HH Arranged: RN, OT [facility] Agency: [place] Date [place]: 07/10/21   Representative spoke with at [place]: [place]  Social Determinants of Health ([place]) Interventions     Readmission Risk Interventions No flowsheet data found.

## 2021-07-11 NOTE — Progress Notes (Signed)
   Daily Progress Note   Patient Name: Kayla Long       Date: 07/11/2021 DOB: 1946/03/18  Age: 75 y.o. MRN#: [id number] Attending Physician: Damita Lack, MD Primary Care Physician: No primary care provider on file. Admit Date: 06/30/2021  Reason for Consultation/Follow-up: Establishing goals of care  Subjective: Chart Reviewed. Updates Received. Patient Assessed.   Patient awake and alert. Denies pain. States she is happy to be going home. Updates provided to daughter. Patient and family appreciative of assistance with completion with advanced directive.   Patient continues to express clear goals to continue with current treatment, wishes for all recommended medical treatments and interventions, full code/full scope. She states she would not want to be on long-term life-supporting machines and if this is only option she would then wish to be comfortable.   All questions answered support provide.   Length of Stay: 11 days  Vital Signs: BP (!) 103/50 (BP Location: Right Arm)   Pulse 96   Temp 97.9 F (36.6 C) (Oral)   Resp 18   Ht '5\' 6"'$  (1.676 m)   Wt (!) 196.3 kg   SpO2 97%   BMI 69.85 kg/m  SpO2: SpO2: 97 % O2 Device: O2 Device: Room Air O2 Flow Rate:    Physical Exam: Awake, alert, morbid obese RRR Clear bilaterally Bilateral lower extremity lymphedema               Palliative Care Assessment & Plan  HPI:  Palliative Care consult requested for goals of care discussion in this 75 y.o. female  with past medical history of  morbid obesity, hyperlipidemia, IBS, spinal stenosis, hypertension, and bilateral lymphedema. She was admitted on 06/30/2021 via EMS from home with complaints of chills and lethargy.  During work-up fount to have sepsis secondary to UTI and started on antibiotics. She is being followed by ID.   Code Status: Full code  Goals of Care/Recommendations: Full Code/Full Scope  Treat the treatable Advanced directive completed  Will consider  outpatient palliative support. Daughter aware they may initiate at anytime by expressing wishes to medical team  Prognosis: Guarded   Discharge Planning: Home with [place]  Thank you for allowing the Palliative Medicine Team to assist in the care of this patient.  Time Total: 25 min.   Visit consisted of counseling and education dealing with the complex and emotionally intense issues of symptom management and palliative care in the setting of serious and potentially life-threatening illness.Greater than 50%  of this time was spent counseling and coordinating care related to the above assessment and plan.  Alda Lea, AGPCNP-BC  Palliative Medicine Team [phone]

## 2021-07-11 NOTE — Progress Notes (Signed)
Pt discharged to home. DC instructions given to daughter at bedside. No concerns voiced. Pt's daughter encouraged to stop by pharmacy and pick up meds that were e-prescribed by Provider. Voiced understanding. Left unit on stretcher pushed by Ambulance staff. Left in stable condition.

## 2021-07-11 NOTE — Discharge Summary (Signed)
Physician Discharge Summary  Kayla Long [id number] DOB: [birth year] DOA: 06/30/2021  PCP: No primary care provider on file.  Admit date: 06/30/2021 Discharge date: 07/11/2021  Admitted From: Home Disposition:    Recommendations for Outpatient Follow-up:  Follow up with PCP in 1-2 weeks Please obtain BMP/CBC in one week your next doctors visit.  Midodrine 10 mg 3 times daily to be taken as needed for systolic blood pressure less than 100. Folic acid, iron supplements, PPI and bowel regimen prescribed  Discharge Condition: Stable CODE STATUS: Full code Diet recommendation: Heart healthy  Brief/Interim Summary: 75 yo with pmhx of Morbid obesity, HTN, B/l Lymphedema, Chronically bedbound presents with chills and lethargy.  She was found to have sepsis secondary to UTI which turned into Citrobacter.  ID was consulted, IV Zosyn was changed to p.o. Levaquin.  Renal ultrasound was negative.  Started on midodrine due to soft blood pressures.  Required 1 unit PRBC on 8/21.  Patient completed antibiotic course in the hospital.  According to PT/OT she was at baseline and mostly bedbound.  Case extensively discussed with patient's daughter over the phone and at bedside on the day of discharge.  She is stable to go home today     Assessment & Plan:   Principal Problem:   Sepsis secondary to UTI [facility]) Active Problems:   Sepsis associated hypotension ([place])   History of hypertension   Lymphedema of upper extremity, bilateral   Morbid obesity with BMI of 50.0-59.9, adult (HCC)   IBS (irritable bowel syndrome)   Sepsis ([place])   Palliative care by specialist     Sepsis secondary to Citrobacter koseri from urinary tract infection, resolved - Sepsis physiology is improving.  Seen by infectious disease - Completed antibiotic course in the hospital - Renal ultrasound- nothing acute.  - Midodrine TID as needed for systolic blood pressure less than 100   UTI: UA is positive.  Completed  antibiotic course   Unstageable sacral decubitus ulcer-continue w/ wound care and frequent turning every 2 hourly   ACD: Status post 1 unit PRBC 8/21.  Hemoglobin stable at 7.7.  Recheck with PCP in 1 week   Folate deficiency-folic acid daily  Vitamin D deficiency-supplements ordered   Hypotension.  History of essential hypertension-not on any home blood pressure medicine.  Midodrine as needed as mentioned above   Depression-Paxil   Morbid obesity: BMI 69.8. Complicates overall care & prognosis    Severe Lymphedema: of b/l LE, chronic skin changes -Routine wound care.   Body mass index is 69.85 kg/m.     Body mass index is 69.85 kg/m.         Discharge Diagnoses:  Principal Problem:   Sepsis secondary to UTI [facility]) Active Problems:   Sepsis associated hypotension ([place])   History of hypertension   Lymphedema of upper extremity, bilateral   Morbid obesity with BMI of 50.0-59.9, adult (HCC)   IBS (irritable bowel syndrome)   Sepsis ([place])   Palliative care by specialist      Consultations: None  Subjective: Feels ok no complaints. Wants to go home. Daugther at bedside.   Discharge Exam: Vitals:   07/11/21 0251 07/11/21 0720  BP: (!) 103/51 (!) 103/50  Pulse: (!) 101 96  Resp: 16 18  Temp: 98 F (36.7 C) 97.9 F (36.6 C)  SpO2: 99% 97%   Vitals:   07/10/21 1622 07/10/21 2032 07/11/21 0251 07/11/21 0720  BP: (!) 106/49 (!) 109/48 (!) 103/51 (!) 103/50  Pulse: 96 90 (!)  101 96  Resp: '20 16 16 18  '$ Temp: 97.7 F (36.5 C) 98.2 F (36.8 C) 98 F (36.7 C) 97.9 F (36.6 C)  TempSrc: Oral Oral Oral Oral  SpO2: 100% 99% 99% 97%  Weight:      Height:        General: Pt is alert, awake, not in acute distress Cardiovascular: RRR, S1/S2 +, no rubs, no gallops Respiratory: CTA bilaterally, no wheezing, no rhonchi Abdominal: Soft, NT, ND, bowel sounds + Extremities: no edema, no cyanosis  Discharge Instructions   Allergies as of 07/11/2021        Reactions   Fluoxetine Hcl    REACTION: headache        Medication List     TAKE these medications    folic acid 1 MG tablet Commonly known as: FOLVITE Take 1 tablet (1 mg total) by mouth daily. Start taking on: July 12, 2021   furosemide 80 MG tablet Commonly known as: LASIX Take 80 mg by mouth daily.   iron polysaccharides 150 MG capsule Commonly known as: NIFEREX Take 1 capsule (150 mg total) by mouth daily. Start taking on: July 12, 2021   midodrine 10 MG tablet Commonly known as: PROAMATINE Take 1 tablet (10 mg total) by mouth 3 (three) times daily as needed (for SBP <100).   Oxycodone HCl 10 MG Tabs Take 10 mg by mouth 3 (three) times daily as needed.   pantoprazole 40 MG tablet Commonly known as: PROTONIX Take 1 tablet (40 mg total) by mouth daily before breakfast.   PARoxetine 30 MG tablet Commonly known as: PAXIL Take 30 mg by mouth daily.   senna-docusate 8.6-50 MG tablet Commonly known as: Senokot-S Take 1 tablet by mouth at bedtime as needed for moderate constipation.   vitamin B-12 100 MCG tablet Commonly known as: CYANOCOBALAMIN Take 100 mcg by mouth daily.        Allergies  Allergen Reactions   Fluoxetine Hcl     REACTION: headache    You were cared for by a hospitalist during your hospital stay. If you have any questions about your discharge medications or the care you received while you were in the hospital after you are discharged, you can call the unit and asked to speak with the hospitalist on call if the hospitalist that took care of you is not available. Once you are discharged, your primary care physician will handle any further medical issues. Please note that no refills for any discharge medications will be authorized once you are discharged, as it is imperative that you return to your primary care physician (or establish a relationship with a primary care physician if you do not have one) for your aftercare needs so that they  can reassess your need for medications and monitor your lab values.   Procedures/Studies: US RENAL  Result Date: 07/05/2021 CLINICAL DATA:  Bacteremia. EXAM: RENAL / URINARY TRACT ULTRASOUND COMPLETE COMPARISON:  CTA abdomen pelvis dated August 27, 2008. FINDINGS: Right Kidney: Renal measurements: 9.3 x 4.9 x 5.5 cm = volume: 131 mL. Echogenicity within normal limits. No mass or hydronephrosis visualized. Left Kidney: Renal measurements: 11.0 x 9.2 x 4.9 cm = volume: 144 mL. Echogenicity within normal limits. No mass or hydronephrosis visualized. 4.5 cm cystic structure near the midpole likely represents an extrarenal pelvis when compared to prior CT. Bladder: Decompressed by Foley catheter. Other: None. IMPRESSION: 1. No acute abnormality. Electronically Signed   By: Titus Dubin M.D.   On: 07/05/2021 10:06  DG Chest Port 1 View  Result Date: 06/30/2021 CLINICAL DATA:  Possible sepsis altered EXAM: PORTABLE CHEST 1 VIEW COMPARISON:  None available FINDINGS: Low lung volumes. Cardiomegaly with probable vascular congestion. Mild atelectasis or pneumonia at the bases. No pneumothorax. IMPRESSION: Low lung volumes. Cardiomegaly with suspected central vascular congestion. Subsegmental atelectasis favored over pneumonia at the bases. Electronically Signed   By: Donavan Foil M.D.   On: 06/30/2021 18:50     The results of significant diagnostics from this hospitalization (including imaging, microbiology, ancillary and laboratory) are listed below for reference.     Microbiology: Recent Results (from the past 240 hour(s))  Urine Culture     Status: Abnormal   Collection Time: 07/02/21  8:13 AM   Specimen: Urine, Random  Result Value Ref Range Status   Specimen Description   Final    URINE, RANDOM Performed at [facility], [address], [place], [place] [address]    Special Requests   Final    NONE Performed at [facility], [address] [place], [place] [address]     Culture (A)  Final    <10,000 COLONIES/mL INSIGNIFICANT GROWTH Performed at [facility], [place] [address] [place], [place] [address]    Report Status 07/04/2021 FINAL  Final  CULTURE, BLOOD (ROUTINE X 2) w Reflex to ID Panel     Status: None   Collection Time: 07/03/21  9:53 AM   Specimen: BLOOD  Result Value Ref Range Status   Specimen Description BLOOD  LT [facility]  Final   Special Requests   Final    BOTTLES DRAWN AEROBIC AND ANAEROBIC Blood Culture adequate volume   Culture   Final    NO GROWTH 5 DAYS Performed at [facility], [address] [place], [place] [address]    Report Status 07/08/2021 FINAL  Final  CULTURE, BLOOD (ROUTINE X 2) w Reflex to ID Panel     Status: None   Collection Time: 07/03/21 10:05 AM   Specimen: BLOOD  Result Value Ref Range Status   Specimen Description BLOOD  LT HAND  Final   Special Requests   Final    BOTTLES DRAWN AEROBIC AND ANAEROBIC Blood Culture results may not be optimal due to an inadequate volume of blood received in culture bottles   Culture   Final    NO GROWTH 5 DAYS Performed at [facility], [address], [place], [place] [address]    Report Status 07/08/2021 FINAL  Final     Labs: BNP (last 3 results) No results for input(s): BNP in the last 8760 hours. Basic Metabolic Panel: Recent Labs  Lab 07/05/21 0622 07/06/21 0453 07/07/21 0416 07/08/21 0516 07/09/21 0512 07/10/21 0517 07/11/21 0701  NA 136 138 137 137 137 138 137  K 3.3* 3.3* 3.5 3.6 3.4* 3.3* 3.5  CL 104 106 107 106 108 110 108  CO2 '24 26 24 24 25 23 22  '$ GLUCOSE 74 75 85 74 76 72 76  BUN '18 18 19 19 18 17 16  '$ CREATININE 1.05* 1.16* 1.17* 1.19* 1.12* 1.17* 1.13*  CALCIUM 7.0* 7.1* 6.9* 6.9* 6.8* 6.8* 7.1*  MG 1.8 1.8 1.8 1.8  --  1.7 1.7  PHOS 2.3* 2.9 3.1 3.0  --   --   --    Liver Function Tests: Recent Labs  Lab 07/05/21 0622  AST 13*  ALT 6  ALKPHOS 96  BILITOT 0.6  PROT 5.3*  ALBUMIN 1.5*  No results for  input(s): LIPASE, AMYLASE in the last 168 hours. No results for input(s): AMMONIA in the last 168 hours. CBC: Recent Labs  Lab 07/07/21 0416 07/08/21 0516 07/09/21 0512 07/09/21 1922 07/10/21 0517 07/11/21 0701  WBC 13.5* 12.0* 11.8*  --  10.1 9.4  HGB 7.3* 7.3* 6.9* 7.6* 7.4* 7.7*  HCT 22.8* 22.2* 22.0* 23.3* 23.6* 23.7*  MCV 84.8 85.7 85.9  --  87.4 87.8  PLT 217 233 248  --  264 273   Cardiac Enzymes: No results for input(s): CKTOTAL, CKMB, CKMBINDEX, TROPONINI in the last 168 hours. BNP: Invalid input(s): POCBNP CBG: No results for input(s): GLUCAP in the last 168 hours. D-Dimer No results for input(s): DDIMER in the last 72 hours. Hgb A1c No results for input(s): HGBA1C in the last 72 hours. Lipid Profile No results for input(s): CHOL, HDL, LDLCALC, TRIG, CHOLHDL, LDLDIRECT in the last 72 hours. Thyroid function studies No results for input(s): TSH, T4TOTAL, T3FREE, THYROIDAB in the last 72 hours.  Invalid input(s): FREET3 Anemia work up No results for input(s): VITAMINB12, FOLATE, FERRITIN, TIBC, IRON, RETICCTPCT in the last 72 hours. Urinalysis    Component Value Date/Time   COLORURINE YELLOW (A) 06/30/2021 2000   APPEARANCEUR TURBID (A) 06/30/2021 2000   LABSPEC 1.006 06/30/2021 2000   PHURINE 6.0 06/30/2021 2000   GLUCOSEU NEGATIVE 06/30/2021 2000   HGBUR MODERATE (A) 06/30/2021 2000   BILIRUBINUR NEGATIVE 06/30/2021 2000   KETONESUR NEGATIVE 06/30/2021 2000   PROTEINUR NEGATIVE 06/30/2021 2000   NITRITE NEGATIVE 06/30/2021 2000   LEUKOCYTESUR LARGE (A) 06/30/2021 2000   Sepsis Labs Invalid input(s): PROCALCITONIN,  WBC,  LACTICIDVEN Microbiology Recent Results (from the past 240 hour(s))  Urine Culture     Status: Abnormal   Collection Time: 07/02/21  8:13 AM   Specimen: Urine, Random  Result Value Ref Range Status   Specimen Description   Final    URINE, RANDOM Performed at [facility], [address], [place], [place] [address]     Special Requests   Final    NONE Performed at [facility], [address] [place], [place] [address]    Culture (A)  Final    <10,000 COLONIES/mL INSIGNIFICANT GROWTH Performed at [facility], [place] [address] [place], [place] [address]    Report Status 07/04/2021 FINAL  Final  CULTURE, BLOOD (ROUTINE X 2) w Reflex to ID Panel     Status: None   Collection Time: 07/03/21  9:53 AM   Specimen: BLOOD  Result Value Ref Range Status   Specimen Description BLOOD  LT [facility]  Final   Special Requests   Final    BOTTLES DRAWN AEROBIC AND ANAEROBIC Blood Culture adequate volume   Culture   Final    NO GROWTH 5 DAYS Performed at [facility], [address] [place], [place] [address]    Report Status 07/08/2021 FINAL  Final  CULTURE, BLOOD (ROUTINE X 2) w Reflex to ID Panel     Status: None   Collection Time: 07/03/21 10:05 AM   Specimen: BLOOD  Result Value Ref Range Status   Specimen Description BLOOD  LT HAND  Final   Special Requests   Final    BOTTLES DRAWN AEROBIC AND ANAEROBIC Blood Culture results may not be optimal due to an inadequate volume of blood received in culture bottles   Culture   Final    NO GROWTH 5 DAYS Performed at [facility], [address] [place], [place] [address]    Report Status 07/08/2021 FINAL  Final     Time coordinating discharge:  I have spent 35 minutes face to face with the patient and on the ward discussing the patients care, assessment, plan and disposition with other care givers. >50% of the time was devoted counseling the patient about the risks and benefits of treatment/Discharge disposition and coordinating care.   SIGNED:   Damita Lack, MD  [facility] 07/11/2021, 10:58 AM   If 7PM-7AM, please contact night-coverage

## 2021-07-11 NOTE — Progress Notes (Signed)
Chaplain Maggie assisted in the coordination of signatures for patient's Advanced Directives.

## 2021-10-23 ENCOUNTER — Emergency Department: Payer: Medicare HMO

## 2021-10-23 ENCOUNTER — Inpatient Hospital Stay: Payer: Self-pay

## 2021-10-23 ENCOUNTER — Inpatient Hospital Stay
Admission: EM | Admit: 2021-10-23 | Discharge: 2021-11-06 | DRG: 871 | Disposition: A | Discharge status: Home Health | Payer: Medicare HMO | Attending: Internal Medicine | Admitting: Internal Medicine

## 2021-10-23 ENCOUNTER — Encounter: Payer: Self-pay | Admitting: Emergency Medicine

## 2021-10-23 DIAGNOSIS — R6520 Severe sepsis without septic shock: Secondary | ICD-10-CM | POA: Diagnosis present

## 2021-10-23 DIAGNOSIS — D62 Acute posthemorrhagic anemia: Secondary | ICD-10-CM | POA: Diagnosis present

## 2021-10-23 DIAGNOSIS — L89159 Pressure ulcer of sacral region, unspecified stage: Secondary | ICD-10-CM | POA: Diagnosis present

## 2021-10-23 DIAGNOSIS — E860 Dehydration: Secondary | ICD-10-CM | POA: Diagnosis present

## 2021-10-23 DIAGNOSIS — K589 Irritable bowel syndrome without diarrhea: Secondary | ICD-10-CM | POA: Diagnosis present

## 2021-10-23 DIAGNOSIS — E8809 Other disorders of plasma-protein metabolism, not elsewhere classified: Secondary | ICD-10-CM | POA: Diagnosis present

## 2021-10-23 DIAGNOSIS — D649 Anemia, unspecified: Secondary | ICD-10-CM

## 2021-10-23 DIAGNOSIS — K580 Irritable bowel syndrome with diarrhea: Secondary | ICD-10-CM | POA: Diagnosis present

## 2021-10-23 DIAGNOSIS — G9341 Metabolic encephalopathy: Secondary | ICD-10-CM | POA: Diagnosis present

## 2021-10-23 DIAGNOSIS — Z87891 Personal history of nicotine dependence: Secondary | ICD-10-CM

## 2021-10-23 DIAGNOSIS — I959 Hypotension, unspecified: Secondary | ICD-10-CM | POA: Diagnosis present

## 2021-10-23 DIAGNOSIS — Z888 Allergy status to other drugs, medicaments and biological substances status: Secondary | ICD-10-CM

## 2021-10-23 DIAGNOSIS — Z515 Encounter for palliative care: Secondary | ICD-10-CM | POA: Diagnosis not present

## 2021-10-23 DIAGNOSIS — Z7401 Bed confinement status: Secondary | ICD-10-CM

## 2021-10-23 DIAGNOSIS — Z90711 Acquired absence of uterus with remaining cervical stump: Secondary | ICD-10-CM

## 2021-10-23 DIAGNOSIS — I9589 Other hypotension: Secondary | ICD-10-CM | POA: Diagnosis not present

## 2021-10-23 DIAGNOSIS — U071 COVID-19: Secondary | ICD-10-CM | POA: Diagnosis present

## 2021-10-23 DIAGNOSIS — R1310 Dysphagia, unspecified: Secondary | ICD-10-CM | POA: Diagnosis present

## 2021-10-23 DIAGNOSIS — Z8249 Family history of ischemic heart disease and other diseases of the circulatory system: Secondary | ICD-10-CM

## 2021-10-23 DIAGNOSIS — E43 Unspecified severe protein-calorie malnutrition: Secondary | ICD-10-CM | POA: Diagnosis present

## 2021-10-23 DIAGNOSIS — N1831 Chronic kidney disease, stage 3a: Secondary | ICD-10-CM | POA: Diagnosis present

## 2021-10-23 DIAGNOSIS — A4189 Other specified sepsis: Secondary | ICD-10-CM | POA: Diagnosis present

## 2021-10-23 DIAGNOSIS — K219 Gastro-esophageal reflux disease without esophagitis: Secondary | ICD-10-CM | POA: Diagnosis present

## 2021-10-23 DIAGNOSIS — R32 Unspecified urinary incontinence: Secondary | ICD-10-CM | POA: Diagnosis present

## 2021-10-23 DIAGNOSIS — R54 Age-related physical debility: Secondary | ICD-10-CM | POA: Diagnosis present

## 2021-10-23 DIAGNOSIS — I5032 Chronic diastolic (congestive) heart failure: Secondary | ICD-10-CM | POA: Diagnosis present

## 2021-10-23 DIAGNOSIS — Z6841 Body Mass Index (BMI) 40.0 and over, adult: Secondary | ICD-10-CM

## 2021-10-23 DIAGNOSIS — R531 Weakness: Secondary | ICD-10-CM | POA: Diagnosis present

## 2021-10-23 DIAGNOSIS — N189 Chronic kidney disease, unspecified: Secondary | ICD-10-CM | POA: Diagnosis not present

## 2021-10-23 DIAGNOSIS — A419 Sepsis, unspecified organism: Secondary | ICD-10-CM | POA: Diagnosis present

## 2021-10-23 DIAGNOSIS — J1282 Pneumonia due to coronavirus disease 2019: Secondary | ICD-10-CM | POA: Diagnosis present

## 2021-10-23 DIAGNOSIS — Z79899 Other long term (current) drug therapy: Secondary | ICD-10-CM

## 2021-10-23 DIAGNOSIS — E876 Hypokalemia: Secondary | ICD-10-CM

## 2021-10-23 DIAGNOSIS — L89309 Pressure ulcer of unspecified buttock, unspecified stage: Secondary | ICD-10-CM | POA: Diagnosis present

## 2021-10-23 DIAGNOSIS — J189 Pneumonia, unspecified organism: Secondary | ICD-10-CM

## 2021-10-23 DIAGNOSIS — N179 Acute kidney failure, unspecified: Secondary | ICD-10-CM | POA: Diagnosis present

## 2021-10-23 DIAGNOSIS — Z7189 Other specified counseling: Secondary | ICD-10-CM | POA: Diagnosis not present

## 2021-10-23 DIAGNOSIS — I130 Hypertensive heart and chronic kidney disease with heart failure and stage 1 through stage 4 chronic kidney disease, or unspecified chronic kidney disease: Secondary | ICD-10-CM | POA: Diagnosis present

## 2021-10-23 DIAGNOSIS — E785 Hyperlipidemia, unspecified: Secondary | ICD-10-CM | POA: Diagnosis present

## 2021-10-23 DIAGNOSIS — I890 Lymphedema, not elsewhere classified: Secondary | ICD-10-CM | POA: Diagnosis not present

## 2021-10-23 DIAGNOSIS — R627 Adult failure to thrive: Secondary | ICD-10-CM | POA: Diagnosis present

## 2021-10-23 DIAGNOSIS — R7881 Bacteremia: Secondary | ICD-10-CM | POA: Diagnosis not present

## 2021-10-23 DIAGNOSIS — R197 Diarrhea, unspecified: Secondary | ICD-10-CM

## 2021-10-23 DIAGNOSIS — R630 Anorexia: Secondary | ICD-10-CM | POA: Diagnosis present

## 2021-10-23 DIAGNOSIS — N183 Chronic kidney disease, stage 3 unspecified: Secondary | ICD-10-CM | POA: Diagnosis present

## 2021-10-23 DIAGNOSIS — F32A Depression, unspecified: Secondary | ICD-10-CM | POA: Diagnosis present

## 2021-10-23 DIAGNOSIS — Z96611 Presence of right artificial shoulder joint: Secondary | ICD-10-CM | POA: Diagnosis present

## 2021-10-23 DIAGNOSIS — Z8719 Personal history of other diseases of the digestive system: Secondary | ICD-10-CM

## 2021-10-23 DIAGNOSIS — B9689 Other specified bacterial agents as the cause of diseases classified elsewhere: Secondary | ICD-10-CM | POA: Diagnosis present

## 2021-10-23 DIAGNOSIS — Z823 Family history of stroke: Secondary | ICD-10-CM

## 2021-10-23 DIAGNOSIS — Z7952 Long term (current) use of systemic steroids: Secondary | ICD-10-CM

## 2021-10-23 DIAGNOSIS — Z86718 Personal history of other venous thrombosis and embolism: Secondary | ICD-10-CM

## 2021-10-23 HISTORY — DX: Anemia, unspecified: D64.9

## 2021-10-23 LAB — CBC WITH DIFFERENTIAL/PLATELET
Abs Immature Granulocytes: 0.22 10*3/uL — ABNORMAL HIGH (ref 0.00–0.07)
Basophils Absolute: 0 10*3/uL (ref 0.0–0.1)
Basophils Relative: 0 %
Eosinophils Absolute: 0.1 10*3/uL (ref 0.0–0.5)
Eosinophils Relative: 1 %
HCT: 20.1 % — ABNORMAL LOW (ref 36.0–46.0)
Hemoglobin: 6.5 g/dL — ABNORMAL LOW (ref 12.0–15.0)
Immature Granulocytes: 2 %
Lymphocytes Relative: 11 %
Lymphs Abs: 1.4 10*3/uL (ref 0.7–4.0)
MCH: 27.9 pg (ref 26.0–34.0)
MCHC: 32.3 g/dL (ref 30.0–36.0)
MCV: 86.3 fL (ref 80.0–100.0)
Monocytes Absolute: 0.5 10*3/uL (ref 0.1–1.0)
Monocytes Relative: 4 %
Neutro Abs: 11.1 10*3/uL — ABNORMAL HIGH (ref 1.7–7.7)
Neutrophils Relative %: 82 %
Platelets: 220 10*3/uL (ref 150–400)
RBC: 2.33 MIL/uL — ABNORMAL LOW (ref 3.87–5.11)
RDW: 16.8 % — ABNORMAL HIGH (ref 11.5–15.5)
WBC: 13.4 10*3/uL — ABNORMAL HIGH (ref 4.0–10.5)
nRBC: 0.1 % (ref 0.0–0.2)

## 2021-10-23 LAB — COMPREHENSIVE METABOLIC PANEL
ALT: 7 U/L (ref 0–44)
AST: 16 U/L (ref 15–41)
Albumin: <1.5 g/dL — ABNORMAL LOW (ref 3.5–5.0)
Alkaline Phosphatase: 132 U/L — ABNORMAL HIGH (ref 38–126)
Anion gap: 10 (ref 5–15)
BUN: 17 mg/dL (ref 8–23)
CO2: 23 mmol/L (ref 22–32)
Calcium: 7.1 mg/dL — ABNORMAL LOW (ref 8.9–10.3)
Chloride: 107 mmol/L (ref 98–111)
Creatinine, Ser: 1.48 mg/dL — ABNORMAL HIGH (ref 0.44–1.00)
GFR, Estimated: 37 mL/min — ABNORMAL LOW (ref >60)
Glucose, Bld: 61 mg/dL — ABNORMAL LOW (ref 70–99)
Potassium: 3 mmol/L — ABNORMAL LOW (ref 3.5–5.1)
Sodium: 140 mmol/L (ref 135–145)
Total Bilirubin: 0.7 mg/dL (ref 0.3–1.2)
Total Protein: 6 g/dL — ABNORMAL LOW (ref 6.5–8.1)

## 2021-10-23 LAB — URINALYSIS, ROUTINE W REFLEX MICROSCOPIC
Bilirubin Urine: NEGATIVE
Glucose, UA: NEGATIVE mg/dL
Ketones, ur: NEGATIVE mg/dL
Nitrite: NEGATIVE
Protein, ur: NEGATIVE mg/dL
Specific Gravity, Urine: 1.006 (ref 1.005–1.030)
pH: 5 (ref 5.0–8.0)

## 2021-10-23 LAB — IRON AND TIBC: Iron: 56 ug/dL (ref 28–170)

## 2021-10-23 LAB — TROPONIN I (HIGH SENSITIVITY)
Troponin I (High Sensitivity): 32 ng/L — ABNORMAL HIGH (ref <18)
Troponin I (High Sensitivity): 25 ng/L — ABNORMAL HIGH (ref <18)

## 2021-10-23 LAB — RESP PANEL BY RT-PCR (FLU A&B, COVID) ARPGX2
Influenza A by PCR: NEGATIVE
Influenza B by PCR: NEGATIVE
SARS Coronavirus 2 by RT PCR: POSITIVE — AB

## 2021-10-23 LAB — FERRITIN: Ferritin: 372 ng/mL — ABNORMAL HIGH (ref 11–307)

## 2021-10-23 LAB — MAGNESIUM: Magnesium: 1.6 mg/dL — ABNORMAL LOW (ref 1.7–2.4)

## 2021-10-23 LAB — BRAIN NATRIURETIC PEPTIDE: B Natriuretic Peptide: 67.2 pg/mL (ref 0.0–100.0)

## 2021-10-23 LAB — LACTIC ACID, PLASMA: Lactic Acid, Venous: 1.7 mmol/L (ref 0.5–1.9)

## 2021-10-23 LAB — PREPARE RBC (CROSSMATCH)

## 2021-10-23 MED ORDER — CHLORHEXIDINE GLUCONATE CLOTH 2 % EX PADS
6.0000 | MEDICATED_PAD | Freq: Every day | CUTANEOUS | Status: DC
  Administered 2021-10-26 – 2021-11-06 (×12): 6 via TOPICAL
  Filled 2021-10-23 (×3): qty 6

## 2021-10-23 MED ORDER — PANTOPRAZOLE SODIUM 40 MG PO TBEC
40.0000 mg | DELAYED_RELEASE_TABLET | Freq: Every day | ORAL | Status: DC
  Administered 2021-10-24 – 2021-10-31 (×8): 40 mg via ORAL
  Filled 2021-10-23 (×9): qty 1

## 2021-10-23 MED ORDER — MIDODRINE HCL 5 MG PO TABS
10.0000 mg | ORAL_TABLET | Freq: Three times a day (TID) | ORAL | Status: DC | PRN
  Administered 2021-10-24: 10 mg via ORAL
  Filled 2021-10-23: qty 2

## 2021-10-23 MED ORDER — ONDANSETRON HCL 4 MG/2ML IJ SOLN
4.0000 mg | Freq: Four times a day (QID) | INTRAMUSCULAR | Status: DC | PRN
  Administered 2021-10-30: 4 mg via INTRAVENOUS
  Filled 2021-10-23: qty 2

## 2021-10-23 MED ORDER — SODIUM CHLORIDE 0.9 % IV SOLN
10.0000 mL/h | Freq: Once | INTRAVENOUS | Status: AC
  Administered 2021-10-23: 10 mL/h via INTRAVENOUS

## 2021-10-23 MED ORDER — SODIUM CHLORIDE 0.9 % IV SOLN
200.0000 mg | Freq: Once | INTRAVENOUS | Status: AC
  Administered 2021-10-23: 200 mg via INTRAVENOUS
  Filled 2021-10-23: qty 200

## 2021-10-23 MED ORDER — ACETAMINOPHEN 325 MG PO TABS
650.0000 mg | ORAL_TABLET | Freq: Four times a day (QID) | ORAL | Status: DC | PRN
  Administered 2021-10-24 – 2021-10-26 (×2): 650 mg via ORAL
  Filled 2021-10-23 (×2): qty 2

## 2021-10-23 MED ORDER — IPRATROPIUM-ALBUTEROL 20-100 MCG/ACT IN AERS
1.0000 | INHALATION_SPRAY | Freq: Four times a day (QID) | RESPIRATORY_TRACT | Status: DC
  Administered 2021-10-23 – 2021-11-06 (×50): 1 via RESPIRATORY_TRACT
  Filled 2021-10-23: qty 4

## 2021-10-23 MED ORDER — SODIUM CHLORIDE 0.9% FLUSH
10.0000 mL | Freq: Two times a day (BID) | INTRAVENOUS | Status: DC
  Administered 2021-10-23 – 2021-10-26 (×4): 10 mL
  Administered 2021-10-26: 40 mL
  Administered 2021-10-27 – 2021-10-29 (×4): 10 mL
  Administered 2021-10-30: 10:00:00 20 mL
  Administered 2021-10-30 – 2021-11-06 (×14): 10 mL

## 2021-10-23 MED ORDER — LACTATED RINGERS IV BOLUS
1000.0000 mL | Freq: Once | INTRAVENOUS | Status: AC
  Administered 2021-10-23: 1000 mL via INTRAVENOUS

## 2021-10-23 MED ORDER — ZINC SULFATE 220 (50 ZN) MG PO CAPS
220.0000 mg | ORAL_CAPSULE | Freq: Every day | ORAL | Status: DC
  Administered 2021-10-24 – 2021-11-06 (×14): 220 mg via ORAL
  Filled 2021-10-23 (×15): qty 1

## 2021-10-23 MED ORDER — ENOXAPARIN SODIUM 100 MG/ML IJ SOSY
0.5000 mg/kg | PREFILLED_SYRINGE | INTRAMUSCULAR | Status: DC
  Administered 2021-10-23 – 2021-11-05 (×12): 97.5 mg via SUBCUTANEOUS
  Filled 2021-10-23 (×17): qty 0.97

## 2021-10-23 MED ORDER — BISACODYL 10 MG RE SUPP
10.0000 mg | Freq: Every day | RECTAL | Status: DC | PRN
  Filled 2021-10-23: qty 1

## 2021-10-23 MED ORDER — ACETAMINOPHEN 650 MG RE SUPP: 650.0000 mg | Freq: Four times a day (QID) | RECTAL | Status: DC | PRN

## 2021-10-23 MED ORDER — SENNOSIDES-DOCUSATE SODIUM 8.6-50 MG PO TABS: 1.0000 | ORAL_TABLET | Freq: Every evening | ORAL | Status: DC | PRN

## 2021-10-23 MED ORDER — ASCORBIC ACID 500 MG PO TABS
500.0000 mg | ORAL_TABLET | Freq: Every day | ORAL | Status: DC
  Administered 2021-10-24 – 2021-11-01 (×9): 500 mg via ORAL
  Filled 2021-10-23 (×9): qty 1

## 2021-10-23 MED ORDER — SODIUM CHLORIDE 0.9 % IV SOLN
100.0000 mg | Freq: Every day | INTRAVENOUS | Status: AC
  Administered 2021-10-24 – 2021-10-27 (×4): 100 mg via INTRAVENOUS
  Filled 2021-10-23 (×3): qty 20
  Filled 2021-10-23: qty 100

## 2021-10-23 MED ORDER — GUAIFENESIN-DM 100-10 MG/5ML PO SYRP
10.0000 mL | ORAL_SOLUTION | ORAL | Status: DC | PRN
  Administered 2021-10-31: 10 mL via ORAL
  Filled 2021-10-23: qty 10

## 2021-10-23 MED ORDER — PAROXETINE HCL 30 MG PO TABS
30.0000 mg | ORAL_TABLET | Freq: Every day | ORAL | Status: DC
  Administered 2021-10-23 – 2021-11-06 (×14): 30 mg via ORAL
  Filled 2021-10-23 (×15): qty 1

## 2021-10-23 MED ORDER — ONDANSETRON HCL 4 MG PO TABS: 4.0000 mg | ORAL_TABLET | Freq: Four times a day (QID) | ORAL | Status: DC | PRN

## 2021-10-23 MED ORDER — ADULT MULTIVITAMIN W/MINERALS CH
1.0000 | ORAL_TABLET | Freq: Every day | ORAL | Status: DC
  Administered 2021-10-25 – 2021-11-06 (×13): 1 via ORAL
  Filled 2021-10-23 (×14): qty 1

## 2021-10-23 MED ORDER — POTASSIUM CHLORIDE 10 MEQ/100ML IV SOLN
10.0000 meq | INTRAVENOUS | Status: AC
  Administered 2021-10-23 (×4): 10 meq via INTRAVENOUS
  Filled 2021-10-23 (×4): qty 100

## 2021-10-23 MED ORDER — SODIUM CHLORIDE 0.9% FLUSH: 10.0000 mL | INTRAVENOUS | Status: DC | PRN

## 2021-10-23 MED ORDER — SODIUM CHLORIDE 0.9 % IV SOLN: INTRAVENOUS | Status: DC

## 2021-10-23 MED ORDER — POTASSIUM CHLORIDE CRYS ER 20 MEQ PO TBCR
40.0000 meq | EXTENDED_RELEASE_TABLET | Freq: Once | ORAL | Status: DC
  Filled 2021-10-23: qty 2

## 2021-10-23 MED ORDER — MAGNESIUM SULFATE 2 GM/50ML IV SOLN
2.0000 g | Freq: Once | INTRAVENOUS | Status: AC
  Administered 2021-10-23: 2 g via INTRAVENOUS
  Filled 2021-10-23: qty 50

## 2021-10-23 NOTE — ED Provider Notes (Signed)
[facility] Emergency Department Provider Note   ____________________________________________   Event Date/Time   First MD Initiated Contact with Patient 10/23/21 [phone]     (approximate)  I have reviewed the triage vital signs and the nursing notes.   HISTORY  Chief Complaint Failure To Thrive    HPI Kayla Long is a 75 y.o. female with past medical history of hypotension on midodrine, lymphedema, and hyperlipidemia who presents to the ED for failure to thrive.  History is limited due to patient's weakness and altered mental status.  She states she is here "because of my daughters."  EMS reports that patient has had poor p.o. intake for the past 3 days and was noted to be hypotensive at 87/40 at the time of EMS arrival.  Family was concerned that patient was generally weak, no focal neurologic deficits noted by EMS.  Patient denies any symptoms beyond weakness, but does have persistent cough throughout our discussion.  She denies any nausea, vomiting, abdominal pain, or dysuria.  She deals with chronic lower extremity swelling that is unchanged today.  She also reportedly got her usual dose of midodrine just prior to arrival.        Past Medical History:  Diagnosis Date   Hx of colonic polyps    tubulovillous adenoma   Hyperlipidemia    IBS (irritable bowel syndrome)    Spinal stenosis    with secondary neuropathy    Patient Active Problem List   Diagnosis Date Noted   Palliative care by specialist    Sepsis ([place]) 07/01/2021   Sepsis associated hypotension ([place]) 06/30/2021   History of hypertension 06/30/2021   Lymphedema of upper extremity, bilateral 06/30/2021   Morbid obesity with BMI of 50.0-59.9, adult ([place]) 06/30/2021   IBS (irritable bowel syndrome) 06/30/2021   Sepsis secondary to UTI ([place]) 06/30/2021   ANKLE PAIN, LEFT 06/17/2009   OBESITY-MORBID (>100') 04/25/2009   NEOPLASM UNSPEC NATURE OTH GENITOURINARY ORGANS 08/30/2008    LEG EDEMA 06/07/2008   HYPERLIPIDEMIA 06/10/2007   NEUROPATHY 06/10/2007   HYPERTENSION 06/10/2007   IBS 06/10/2007   ARTHRITIS, SPINE 06/10/2007   SPINAL STENOSIS 06/10/2007    Past Surgical History:  Procedure Laterality Date   PARTIAL HYSTERECTOMY     vaginal deliveries     x2   VESICOVAGINAL FISTULA CLOSURE W/ TAH      Prior to Admission medications   Medication Sig Start Date End Date Taking? Authorizing Provider  folic acid (FOLVITE) 1 MG tablet Take 1 tablet (1 mg total) by mouth daily. 07/12/21   Amin, Jeanella Flattery, MD  furosemide (LASIX) 80 MG tablet Take 80 mg by mouth daily.      [provider]  iron polysaccharides (NIFEREX) 150 MG capsule Take 1 capsule (150 mg total) by mouth daily. 07/12/21   Amin, Jeanella Flattery, MD  midodrine (PROAMATINE) 10 MG tablet Take 1 tablet (10 mg total) by mouth 3 (three) times daily as needed (for SBP <100). 07/11/21   Amin, Jeanella Flattery, MD  Oxycodone HCl 10 MG TABS Take 10 mg by mouth 3 (three) times daily as needed. 06/17/21   [provider]  pantoprazole (PROTONIX) 40 MG tablet Take 1 tablet (40 mg total) by mouth daily before breakfast. 07/11/21   Amin, Jeanella Flattery, MD  PARoxetine (PAXIL) 30 MG tablet Take 30 mg by mouth daily. 04/13/21   [provider]  senna-docusate (SENOKOT-S) 8.6-50 MG tablet Take 1 tablet by mouth at bedtime as needed for moderate  constipation. 07/11/21   Amin, Jeanella Flattery, MD  vitamin B-12 (CYANOCOBALAMIN) 100 MCG tablet Take 100 mcg by mouth daily.    [provider]    Allergies Fluoxetine hcl  Family History  Problem Relation Age of Onset   Stroke Mother    Stroke Father    Heart disease Brother     Social History Social History   Tobacco Use   Smoking status: Former    Types: Cigarettes   Smokeless tobacco: Never   Tobacco comments:    off and on?  Substance Use Topics   Alcohol use: Yes    Comment: occasional    Drug use: Never    Review of  Systems  Constitutional: No fever/chills.  Positive for generalized weakness. Eyes: No visual changes. ENT: No sore throat. Cardiovascular: Denies chest pain. Respiratory: Denies shortness of breath.  Positive for cough. Gastrointestinal: No abdominal pain.  No nausea, no vomiting.  No diarrhea.  No constipation.  Positive for decreased oral intake. Genitourinary: Negative for dysuria. Musculoskeletal: Negative for back pain. Skin: Negative for rash. Neurological: Negative for headaches, focal weakness or numbness.  ____________________________________________   PHYSICAL EXAM:  VITAL SIGNS: ED Triage Vitals  Enc Vitals Group     BP 10/23/21 0813 (!) 100/56     Pulse Rate 10/23/21 0813 (!) 122     Resp 10/23/21 0813 18     Temp 10/23/21 0813 99.3 F (37.4 C)     Temp Source 10/23/21 0813 Oral     SpO2 10/23/21 0813 100 %     Weight --      Height --      Head Circumference --      Peak Flow --      Pain Score 10/23/21 0814 0     Pain Loc --      Pain Edu? --      Excl. in [place]? --     Constitutional: Alert and oriented. Eyes: Conjunctivae are normal. Head: Atraumatic. Nose: No congestion/rhinnorhea. Mouth/Throat: Mucous membranes are dry and cracked. Neck: Normal ROM Cardiovascular: Tachycardic, regular rhythm. Grossly normal heart sounds.  2+ radial pulses bilaterally. Respiratory: Normal respiratory effort.  No retractions. Lungs CTAB. Gastrointestinal: Soft and nontender. No distention. Genitourinary: deferred Musculoskeletal: Severe lower extremity lymphedema with no associated erythema, warmth, or tenderness. Neurologic:  Normal speech and language. No gross focal neurologic deficits are appreciated. Skin:  Skin is warm, dry and intact. No rash noted. Psychiatric: Mood and affect are normal. Speech and behavior are normal.  ____________________________________________   LABS (all labs ordered are listed, but only abnormal results are displayed)  Labs  Reviewed  RESP PANEL BY RT-PCR (FLU A&B, COVID) ARPGX2 - Abnormal; Notable for the following components:      Result Value   SARS Coronavirus 2 by RT PCR POSITIVE (*)    All other components within normal limits  COMPREHENSIVE METABOLIC PANEL - Abnormal; Notable for the following components:   Potassium 3.0 (*)    Glucose, Bld 61 (*)    Creatinine, Ser 1.48 (*)    Calcium 7.1 (*)    Total Protein 6.0 (*)    Albumin <1.5 (*)    Alkaline Phosphatase 132 (*)    GFR, Estimated 37 (*)    All other components within normal limits  MAGNESIUM - Abnormal; Notable for the following components:   Magnesium 1.6 (*)    All other components within normal limits  CBC WITH DIFFERENTIAL/PLATELET - Abnormal; Notable for the following components:  WBC 13.4 (*)    RBC 2.33 (*)    Hemoglobin 6.5 (*)    HCT 20.1 (*)    RDW 16.8 (*)    Neutro Abs 11.1 (*)    Abs Immature Granulocytes 0.22 (*)    All other components within normal limits  FERRITIN - Abnormal; Notable for the following components:   Ferritin 372 (*)    All other components within normal limits  TROPONIN I (HIGH SENSITIVITY) - Abnormal; Notable for the following components:   Troponin I (High Sensitivity) 32 (*)    All other components within normal limits  TROPONIN I (HIGH SENSITIVITY) - Abnormal; Notable for the following components:   Troponin I (High Sensitivity) 25 (*)    All other components within normal limits  CULTURE, BLOOD (ROUTINE X 2)  CULTURE, BLOOD (ROUTINE X 2)  BRAIN NATRIURETIC PEPTIDE  LACTIC ACID, PLASMA  IRON AND TIBC  CBC WITH DIFFERENTIAL/PLATELET  URINALYSIS, ROUTINE W REFLEX MICROSCOPIC  TYPE AND SCREEN  PREPARE RBC (CROSSMATCH)   ____________________________________________  EKG  ED ECG REPORT I, Blake Divine, the attending physician, personally viewed and interpreted this ECG.   Date: 10/23/2021  EKG Time: 8:27  Rate: 124  Rhythm: sinus tachycardia, frequent PVC's noted  Axis: Normal   Intervals:none  ST&T Change: None   PROCEDURES  Procedure(s) performed (including Critical Care):  Procedures   ____________________________________________   INITIAL IMPRESSION / ASSESSMENT AND PLAN / ED COURSE      75 year old female with past medical history of hypotension on midodrine, lower extremity lymphedema, and hyperlipidemia presents to the ED for increasing generalized weakness over the past 3 days with poor oral intake.  Patient noted to be borderline hypotensive on arrival however she deals with chronic hypotension and blood pressure usually runs low per family, she just received dose of midodrine prior to arrival and we will continue to monitor.  She does appear quite dehydrated and we will give IV fluid bolus.  She has a frequent cough but is not in any respiratory distress and is maintaining O2 sats on room air.  EKG shows frequent PVCs but no ischemic changes, troponin and electrolytes are pending.  We will check chest x-ray, UA, and viral testing.  Viral testing is positive for COVID-19, which would explain patient's generalized weakness, altered mental status, and decreased p.o. intake.  Chest x-ray reviewed by me and shows no infiltrate, edema, or effusion.  Electrolytes remarkable for hypokalemia and hypomagnesemia, which we will replete.  CT head is negative for acute process.  Labs also remarkable for anemia with hemoglobin of 6.5.  Daughter states that patient has had some mild oozing of blood from the back of her legs, but she has not noticed any blood in her stool or dark tarry stool.  We will transfuse 1 unit PRBCs and case discussed with hospitalist for admission.      ____________________________________________   FINAL CLINICAL IMPRESSION(S) / ED DIAGNOSES  Final diagnoses:  COVID-19  Hypokalemia  Hypomagnesemia  Anemia, unspecified type     ED Discharge Orders     None        Note:  This document was prepared using Dragon voice recognition  software and may include unintentional dictation errors.    Blake Divine, MD 10/23/21 1227

## 2021-10-23 NOTE — Progress Notes (Signed)
Peripherally Inserted Central Catheter Placement  The IV Nurse has discussed with the patient and/or persons authorized to consent for the patient, the purpose of this procedure and the potential benefits and risks involved with this procedure.  The benefits include less needle sticks, lab draws from the catheter, and the patient may be discharged home with the catheter. Risks include, but not limited to, infection, bleeding, blood clot (thrombus formation), and puncture of an artery; nerve damage and irregular heartbeat and possibility to perform a PICC exchange if needed/ordered by physician.  Alternatives to this procedure were also discussed.  Bard Power PICC patient education guide, fact sheet on infection prevention and patient information card has been provided to patient /or left at bedside. PICC placed by Shon Hale RN.   PICC Placement Documentation  PICC Double Lumen 62/94/76 PICC Right Basilic 41 cm 1 cm (Active)  Indication for Insertion or Continuance of Line Limited venous access - need for IV therapy >5 days (PICC only) 10/23/21 2023  Exposed Catheter (cm) 1 cm 10/23/21 2023  Site Assessment Clean;Dry;Intact 10/23/21 2023  Lumen #1 Status Blood return noted;Flushed;Saline locked 10/23/21 2023  Lumen #2 Status Blood return noted;Flushed;Saline locked 10/23/21 2023  Dressing Type Transparent;Securing device 10/23/21 2023  Dressing Status Clean;Dry;Intact 10/23/21 2023  Antimicrobial disc in place? Yes 10/23/21 2023  Safety Lock Not Applicable 54/65/03 5465  Line Care Connections checked and tightened 10/23/21 2023  Line Adjustment (NICU/IV Team Only) No 10/23/21 2023  Dressing Intervention New dressing 10/23/21 2023  Dressing Change Due 10/30/21 10/23/21 2023       Edson Snowball 10/23/2021, 8:25 PM

## 2021-10-23 NOTE — ED Notes (Signed)
Blood bank called to check on blood status, reports 1 unit is ready. IV team at bedside

## 2021-10-23 NOTE — ED Notes (Signed)
Pt rolled and cleaned up. Pt has multiple bleeding areas on buttocks and peri area.

## 2021-10-23 NOTE — ED Notes (Signed)
MD paged regarding pts IV access infiltrating.

## 2021-10-23 NOTE — ED Triage Notes (Signed)
Pt arrives via EMS from home with reports of poor PO intake for 3 days and BP of 87/40. Pt denies any pain. EMS reports negative stroke screen.

## 2021-10-23 NOTE — H&P (Signed)
History and Physical    PHILENA OBEY KZL:[id number] DOB: 01-26-1946 DOA: 10/23/2021  PCP: Pcp, No   Chief Complaint: Poor p.o. intake, confusion  HPI: Kayla Long is a 75 y.o. female with medical history significant of hypotension on midodrine, severe lymphedema, bedbound status, diastolic CHF, hypertension presented to the ED on morning of 10/23/2021 with reports by family of very poor p.o. intake and worsening confusion for the past 3 days.  EMS was called to the home and found patient's blood pressure 87/40.  Patient had been unable to take her midodrine.  Family reported patient recently having cough, very poor appetite with little p.o. intake but no apparent abdominal pain nausea or vomiting.  Family reported patient's mental status nowhere near baseline.  She is typically conversant, witty and quite interactive but has been more withdrawn.  Exposed to COVID-19 In a family member about a week prior.  Family reported patient had more oozing from her decubitus wounds recently.  In the ED, temp 99.3, heart rate 122, BP 100/56, SPO2 100% on room air with RR 18.  Labs were notable for potassium 3.0, magnesium 1.6, creatinine 1.48, glucose 61, albumin less than 1.5, hemoglobin 6.5, white count 13.4.  Patient tested positive for COVID-19.    Chest x-ray with low lung volumes and bibasilar atelectasis but no acute findings.    Noncontrast head CT negative for any acute findings, showed minimal chronic small vessel ischemic changes and a 4 mm rounded dural based calcification in the left frontal lobe possibly an incidental finding of calcified meningioma.  Patient was ordered 1 unit packed red cells, magnesium and potassium replacement.  Admitted to hospitalist service. Started on remdesivir for COVID-19 infection.  Review of Systems: ROS   As per HPI otherwise 10 point review of systems negative.   Allergies  Allergen Reactions   Fluoxetine Hcl     REACTION: headache    Past  Medical History:  Diagnosis Date   Hx of colonic polyps    tubulovillous adenoma   Hyperlipidemia    IBS (irritable bowel syndrome)    Spinal stenosis    with secondary neuropathy    Past Surgical History:  Procedure Laterality Date   PARTIAL HYSTERECTOMY     vaginal deliveries     x2   VESICOVAGINAL FISTULA CLOSURE W/ TAH       reports that she has quit smoking. Her smoking use included cigarettes. She has never used smokeless tobacco. She reports current alcohol use. She reports that she does not use drugs.  Family History  Problem Relation Age of Onset   Stroke Mother    Stroke Father    Heart disease Brother     Prior to Admission medications   Medication Sig Start Date End Date Taking? Authorizing Provider  folic acid (FOLVITE) 1 MG tablet Take 1 tablet (1 mg total) by mouth daily. 07/12/21   Amin, Jeanella Flattery, MD  furosemide (LASIX) 80 MG tablet Take 80 mg by mouth daily.      [provider]  iron polysaccharides (NIFEREX) 150 MG capsule Take 1 capsule (150 mg total) by mouth daily. 07/12/21   Amin, Jeanella Flattery, MD  midodrine (PROAMATINE) 10 MG tablet Take 1 tablet (10 mg total) by mouth 3 (three) times daily as needed (for SBP <100). 07/11/21   Amin, Jeanella Flattery, MD  Oxycodone HCl 10 MG TABS Take 10 mg by mouth 3 (three) times daily as needed. 06/17/21   [provider]  pantoprazole ([place])  40 MG tablet Take 1 tablet (40 mg total) by mouth daily before breakfast. 07/11/21   Amin, Jeanella Flattery, MD  PARoxetine (PAXIL) 30 MG tablet Take 30 mg by mouth daily. 04/13/21   [provider]  senna-docusate (SENOKOT-S) 8.6-50 MG tablet Take 1 tablet by mouth at bedtime as needed for moderate constipation. 07/11/21   Amin, Jeanella Flattery, MD  vitamin B-12 (CYANOCOBALAMIN) 100 MCG tablet Take 100 mcg by mouth daily.    [provider]    Physical Exam: Vitals:   10/23/21 1430 10/23/21 1645 10/23/21 1652 10/23/21 1708  BP: (!) 100/52  105/65 (!)  83/48  Pulse: (!) 117  (!) 112 (!) 115  Resp: (!) 21  (!) 23 19  Temp:  98.2 F (36.8 C) 98.2 F (36.8 C) 98 F (36.7 C)  TempSrc:  Oral Oral Oral  SpO2: 95%  94% 93%  Weight:       Physical Exam   General exam: awake, alert, no acute distress HEENT: Wearing glasses moist mucus membranes, hearing grossly normal, poor dentition Respiratory system: CTAB with diminished bases, no wheezes, rales or rhonchi, normal respiratory effort, on room air. Cardiovascular system: normal S1/S2, tachycardic, regular rhythm.   Gastrointestinal system: soft, nontender abdomen, bowel sounds present. Central nervous system: A&O x3. no gross focal neurologic deficits, normal speech but minimally conversational Extremities: Severely overgrown fingernails, severe lymphedema bilateral lower extremities with skin cracking and serosanguineous weeping Skin: dry, intact, normal temperature, severe lower extremity venous stasis and lymphedema Psychiatry: normal mood, flat affect     Labs on Admission: I have personally reviewed the patients's labs and imaging studies.  Notable for:  --Potassium 3.0 --Magnesium 1.6 --Glucose 61 --Albumin less than 1.5 --Alk phos 132 --Hemoglobin 6.5 --WBC 13.4 -- COVID-19 positive  Chest x-ray with low lung volumes and bibasilar atelectasis, no focal infiltrates to suggest pneumonia.  Noncontrast head CT without any acute infarct or acute intracranial hemorrhage.  Showed minimal chronic small vessel ischemic changes; 4 mm rounded dural based calcification overlying the anterior left frontal lobe, nonspecific with possible tiny incidental calcified meningioma; mild generalized cerebral atrophy.  EKG sinus tachycardia at 124 bpm, normal axis, PVCs  Assessment/Plan Principal Problem:   COVID-19 virus infection Active Problems:   Acute kidney injury superimposed on CKD (HCC)   Severe sepsis (HCC)   Acute metabolic encephalopathy   Acute on chronic anemia   Anorexia    Hypomagnesemia   Hypokalemia   Hypotension   Lymphedema of both lower extremities   CKD (chronic kidney disease), stage III (HCC)   Chronic diastolic CHF (congestive heart failure) (HCC)   IBS (irritable bowel syndrome)   Pressure injury of skin of buttock   Depression     COVID-19 virus infection - POA, with sick family contact about one week prior. --remdesevir --steroids deferred given no hypoxia --bronchodilator, mucolytics --vitamin c, zinc, MV when able to take PO --follow inflammatory markers, CMP, CBC --airborne and contact precautions  Severe sepsis - due to Covid-19. POA with tachycardia, leukocytosis, hypotension, AKI and encephalopathy consistent with organ dysfunction.  Lactate normal. --treat underlying infection as outlined --monitor hemodynamics closely  Acute metabolic encephalopathy - multifactorial due to Covid-19 infection, dehydration, worsened anemia, renal failure. --treat underlying causes as outlined --delirium precautions --NPO until mental status improves for safe PO intake  Acute kidney injury superimposed on CKD stage IIIa - POA with Cr 1.48, baseline Cr around 1.1.  Likely pre-renal in setting of poor PO intake and hypotension.   Given  1L of LR in the ED. --monitor BMP --gentle IV maintenance hydration until PO intake improves --hold home Lasix --renally dose meds and avoid nephrotoxins  Acute on chronic anemia - baseline Hbg 7.7 three months ago, presented with Hbg 6.5.  Family report increased blood from wounds recently, so likely acute on chronic blood loss. --1 unit pRBC's ordered for transfusion in the ED --Trend Hbg and transfuse if < 7.0 --Monitor for bleeding  Anorexia / Failure to thrive / Severe protein calorie malnutrition - acute anorexia likely due to Covid infection. Family report pt typically eats well at baseline. Albumin < 1.5 on admission, temporal wasting on exam. --IV hydration --electrolyte replacement --dietitian  consulted  Hypokalemia / Hypomagnesemia  - K 3.0, Mg 1.6 on admission.  K and Mg being replaced. Due to poor PO intake. --Monitor and replace as needed  Hypotension, chronic - on midodrine but may not have been getting it due to encephalopathy and anorexia. --continue midodrine --maintain MAP > 65 --telemetry --monitor closely  Lymphedema of both lower extremities - severe. With secondary wounds per daughter, they have noted increasing skin breakdown recently at home - likely due to severe malnutrition. --WOC consulted --Lasix on hold due to AKI --Monitor   CKD (chronic kidney disease), stage IIIa - with AKI on admission, as above.  Monitor renal function. Baseline Cr around 1.1.  Chronic diastolic CHF - Echo 0/7121 in Care Everywhere EF >55%, grade II diastolic dysfunction.  Volume status difficult on exam with severe lymphedema, but given poor PO intake, AKI, dry mucus membranes, pt seems hypovolemic on admission. --monitor volume status with IV hydration --I/O's and daily weights  IBS (irritable bowel syndrome) - report as chronic issue. --home Senna-S PRN continued  Pressure injury of skin of buttock - WOC consulted. Family reported recently worsening skin breakdown and bleeding.  Depression - continue home Paxil     Code Status: Full Code   DVT Prophylaxis: Lovenox   Family Communication: Daughter at bedside during admission encounter Admission status: Inpatient Telemetry Medical  Certification: The appropriate patient status for this patient is INPATIENT. Inpatient status is judged to be reasonable and necessary in order to provide the required intensity of service to ensure the patient's safety. The patient's presenting symptoms, physical exam findings, and initial radiographic and laboratory data in the context of their chronic comorbidities is felt to place them at high risk for further clinical deterioration. Furthermore, it is not anticipated that the patient will  be medically stable for discharge from the hospital within 2 midnights of admission.   * I certify that at the point of admission it is my clinical judgment that the patient will require inpatient hospital care spanning beyond 2 midnights from the point of admission due to high intensity of service, high risk for further deterioration and high frequency of surveillance required.*   Time spent: 40 minutes including time at bedside and in coordinating care   Ezekiel Slocumb MD [facility] If 7PM-7AM, please contact night-coverage [email or web address]  10/23/2021, 5:23 PM

## 2021-10-23 NOTE — Progress Notes (Addendum)
Ultrasound to both posterior and anterior lower forearms- nothing suitable for IV access. Placed # 24, .75" Right hand - RN aware. There is a current order for PICC line. Daughter here visiting aware her mom has limited IV access.

## 2021-10-23 NOTE — ED Notes (Signed)
Patient transported to X-ray 

## 2021-10-23 NOTE — ED Notes (Signed)
Lab called to come get type and screen

## 2021-10-23 NOTE — ED Notes (Signed)
Pt unable to follow commands to swallow water or take pills

## 2021-10-23 NOTE — Progress Notes (Signed)
Order for PICC placement received. Notified Vernell Leep RN, and Nicole Kindred, DO that uncertain of exact time for placement and to please consider alternative access if patient has emergent needs prior to PICC.

## 2021-10-23 NOTE — Consult Note (Signed)
,  Remdesivir - Pharmacy Brief Note   O:  ALT: 7 SpO2: 94% on RA   A/P:  COVID + with minimal symptoms, on RA Remdesivir 200 mg IVPB once followed by 100 mg IVPB daily x 4 days.  Will continue to follow and re-evaluate if appropriate for 3 day regimen   Dorothe Pea, PharmD, BCPS Clinical Pharmacist   10/23/2021 1:40 PM

## 2021-10-24 ENCOUNTER — Encounter: Payer: Self-pay | Admitting: Internal Medicine

## 2021-10-24 DIAGNOSIS — Z7189 Other specified counseling: Secondary | ICD-10-CM

## 2021-10-24 DIAGNOSIS — I890 Lymphedema, not elsewhere classified: Secondary | ICD-10-CM

## 2021-10-24 DIAGNOSIS — U071 COVID-19: Secondary | ICD-10-CM | POA: Diagnosis not present

## 2021-10-24 DIAGNOSIS — Z515 Encounter for palliative care: Secondary | ICD-10-CM

## 2021-10-24 DIAGNOSIS — L89309 Pressure ulcer of unspecified buttock, unspecified stage: Secondary | ICD-10-CM

## 2021-10-24 LAB — PHOSPHORUS: Phosphorus: 2.1 mg/dL — ABNORMAL LOW (ref 2.5–4.6)

## 2021-10-24 LAB — CBC WITH DIFFERENTIAL/PLATELET
Abs Immature Granulocytes: 0.3 10*3/uL — ABNORMAL HIGH (ref 0.00–0.07)
Basophils Absolute: 0 10*3/uL (ref 0.0–0.1)
Basophils Relative: 0 %
Eosinophils Absolute: 0.1 10*3/uL (ref 0.0–0.5)
Eosinophils Relative: 1 %
HCT: 21.5 % — ABNORMAL LOW (ref 36.0–46.0)
Hemoglobin: 7.5 g/dL — ABNORMAL LOW (ref 12.0–15.0)
Immature Granulocytes: 3 %
Lymphocytes Relative: 13 %
Lymphs Abs: 1.5 10*3/uL (ref 0.7–4.0)
MCH: 29 pg (ref 26.0–34.0)
MCHC: 34.9 g/dL (ref 30.0–36.0)
MCV: 83 fL (ref 80.0–100.0)
Monocytes Absolute: 0.5 10*3/uL (ref 0.1–1.0)
Monocytes Relative: 4 %
Neutro Abs: 9.1 10*3/uL — ABNORMAL HIGH (ref 1.7–7.7)
Neutrophils Relative %: 79 %
Platelets: 178 10*3/uL (ref 150–400)
RBC: 2.59 MIL/uL — ABNORMAL LOW (ref 3.87–5.11)
RDW: 15.6 % — ABNORMAL HIGH (ref 11.5–15.5)
WBC: 11.5 10*3/uL — ABNORMAL HIGH (ref 4.0–10.5)
nRBC: 0.2 % (ref 0.0–0.2)

## 2021-10-24 LAB — COMPREHENSIVE METABOLIC PANEL
ALT: 6 U/L (ref 0–44)
AST: 12 U/L — ABNORMAL LOW (ref 15–41)
Albumin: <1.5 g/dL — ABNORMAL LOW (ref 3.5–5.0)
Alkaline Phosphatase: 109 U/L (ref 38–126)
Anion gap: 8 (ref 5–15)
BUN: 18 mg/dL (ref 8–23)
CO2: 24 mmol/L (ref 22–32)
Calcium: 6.8 mg/dL — ABNORMAL LOW (ref 8.9–10.3)
Chloride: 106 mmol/L (ref 98–111)
Creatinine, Ser: 1.54 mg/dL — ABNORMAL HIGH (ref 0.44–1.00)
GFR, Estimated: 35 mL/min — ABNORMAL LOW (ref >60)
Glucose, Bld: 67 mg/dL — ABNORMAL LOW (ref 70–99)
Potassium: 2.7 mmol/L — CL (ref 3.5–5.1)
Sodium: 138 mmol/L (ref 135–145)
Total Bilirubin: 1 mg/dL (ref 0.3–1.2)
Total Protein: 5.5 g/dL — ABNORMAL LOW (ref 6.5–8.1)

## 2021-10-24 LAB — MAGNESIUM: Magnesium: 1.6 mg/dL — ABNORMAL LOW (ref 1.7–2.4)

## 2021-10-24 LAB — D-DIMER, QUANTITATIVE: D-Dimer, Quant: 3.22 ug/mL-FEU — ABNORMAL HIGH (ref 0.00–0.50)

## 2021-10-24 LAB — C-REACTIVE PROTEIN: CRP: 37.3 mg/dL — ABNORMAL HIGH (ref <1.0)

## 2021-10-24 MED ORDER — KCL IN DEXTROSE-NACL 20-5-0.9 MEQ/L-%-% IV SOLN
INTRAVENOUS | Status: DC
  Filled 2021-10-24 (×3): qty 1000

## 2021-10-24 MED ORDER — ALBUMIN HUMAN 25 % IV SOLN
25.0000 g | Freq: Once | INTRAVENOUS | Status: AC
  Administered 2021-10-24: 25 g via INTRAVENOUS
  Filled 2021-10-24: qty 100

## 2021-10-24 MED ORDER — MIDODRINE HCL 5 MG PO TABS
10.0000 mg | ORAL_TABLET | Freq: Three times a day (TID) | ORAL | Status: DC
  Administered 2021-10-24 – 2021-11-06 (×36): 10 mg via ORAL
  Filled 2021-10-24 (×38): qty 2

## 2021-10-24 MED ORDER — ZINC OXIDE 20 % EX OINT
TOPICAL_OINTMENT | Freq: Three times a day (TID) | CUTANEOUS | Status: DC
  Administered 2021-10-29: 1 via TOPICAL
  Filled 2021-10-24 (×7): qty 28.35

## 2021-10-24 MED ORDER — POTASSIUM CHLORIDE 10 MEQ/100ML IV SOLN
10.0000 meq | INTRAVENOUS | Status: AC
  Administered 2021-10-24 (×3): 10 meq via INTRAVENOUS
  Filled 2021-10-24 (×3): qty 100

## 2021-10-24 MED ORDER — MAGNESIUM SULFATE 2 GM/50ML IV SOLN
2.0000 g | Freq: Once | INTRAVENOUS | Status: AC
  Administered 2021-10-24: 2 g via INTRAVENOUS
  Filled 2021-10-24: qty 50

## 2021-10-24 MED ORDER — HYDROCORTISONE SOD SUC (PF) 100 MG IJ SOLR
100.0000 mg | Freq: Three times a day (TID) | INTRAMUSCULAR | Status: DC
  Administered 2021-10-24 – 2021-10-25 (×3): 100 mg via INTRAVENOUS
  Filled 2021-10-24 (×3): qty 2

## 2021-10-24 NOTE — Consult Note (Addendum)
WOC Nurse Consult Note: Patient receiving care in [facility] ED3. This portion of the consult was completed remotely after review of record of this Covid + patient. Reason for Consult: Lymphedema and pressure wounds Wound type:  Pressure Injury POA: Yes/No/NA Measurement: Wound bed: Drainage (amount, consistency, odor)  Periwound: Dressing procedure/placement/frequency:  I have ordered TID application of 34% Zinc oxide ointment to the buttocks and all areas impacted by incontinence.  ED nurses note indicates there are areas that were bleeding when the patient was provided hygiene care.  For all moist body folds I have ordered InterDry textile wicking fabric--to be used alone and NOT in conjunction with powders, lotions, or creams.  And, for any wound areas, to cover those with Aquacel Advantage Kellie Simmering [phone]) and secure with kerlix or dry gauze, or foam dressing and change daily. Aquacel Advantage provides antimicrobial properties associated with the silver component, and provides absorption of exudate.  The treatment and management of lymphedema exceeds the scope of practice of the [facility] nurse.  Below are resources available once discharged.  Photos from 07/03/21 show a typical hardened, darkened, cobblestone skin texture associated with long-standing, poorly controlled/managed lymphedema.  Lymphedema  Resources (updated 09/2021) Each site requires a referral from your primary care MD [facility] [address], [place]  [phone] (Upper extremities)  [place], [place] [phone] (Lower extremities, PATIENT CAN NOT HAVE A WOUND)  [place]. [address] [place], [place] [address] [phone] [facility] [place], [place] [address] [phone] [place], [address] Medical Office Building [place], [place] [phone]  [facility] [address], [place] [address] [phone]  [place] [facility]  (only treatment for lymphedema related to cancer diagnosis) [place], [place] [address] [phone]    [facility] [place], [place] [address] [phone] [facility] [phone] [place], [place] [address] [phone]

## 2021-10-24 NOTE — Progress Notes (Addendum)
MD notified of sustained heart rate and continued hypotension. EKG obtained. MD assessed at bedside.

## 2021-10-24 NOTE — Progress Notes (Signed)
PROGRESS NOTE    Kayla Long  BOF:[id number] DOB: 07-21-1946 DOA: 10/23/2021 PCP: Pcp, No    Brief Narrative:  75 y.o. female with medical history significant of hypotension on midodrine, severe lymphedema, bedbound status, diastolic CHF, hypertension presented to the ED on morning of 10/23/2021 with reports by family of very poor p.o. intake and worsening confusion for the past 3 days.  EMS was called to the home and found patient's blood pressure 87/40.  Patient had been unable to take her midodrine.  Family reported patient recently having cough, very poor appetite with little p.o. intake but no apparent abdominal pain nausea or vomiting.  Family reported patient's mental status nowhere near baseline.  She is typically conversant, witty and quite interactive but has been more withdrawn.  Exposed to COVID-19 In a family member about a week prior.  Family reported patient had more oozing from her decubitus wounds recently.   Assessment & Plan:   Principal Problem:   COVID-19 virus infection Active Problems:   Lymphedema of both lower extremities   IBS (irritable bowel syndrome)   Severe sepsis (HCC)   Acute metabolic encephalopathy   Anorexia   Acute on chronic anemia   Hypomagnesemia   Hypokalemia   Hypotension   Acute kidney injury superimposed on CKD (HCC)   CKD (chronic kidney disease), stage III (HCC)   Pressure injury of skin of buttock   Chronic diastolic CHF (congestive heart failure) ([place])   Depression  COVID-19 virus infection - POA with sick family contact about one week prior. Plan: Continue remdesivir, plan for 3-5-day course No steroids given lack of hypoxia Bronchodilators mucolytic's Respiratory precautions    Severe sepsis - due to Covid-19. POA with tachycardia, leukocytosis, hypotension,  AKI and encephalopathy consistent with organ dysfunction.  Lactate normal. --treat underlying infection as outlined --monitor hemodynamics closely   Acute  metabolic encephalopathy  multifactorial due to Covid-19 infection, dehydration, worsened anemia, renal failure. --treat underlying causes as outlined --delirium precautions --NPO until mental status improves for safe PO intake -RN swallow screen and SLP evaluation   Acute kidney injury superimposed on CKD stage IIIa - POA with Cr 1.48, baseline Cr around 1.1.  Likely pre-renal in setting of poor PO intake and hypotension.   Given 1L of LR in the ED. --gentle IV maintenance hydration until PO intake improves --hold home Lasix --renally dose meds and avoid nephrotoxins   Acute on chronic anemia - baseline Hbg 7.7 three months ago, presented with Hbg 6.5.  Family report increased blood from wounds recently, so likely acute on chronic blood loss. --1 unit pRBC's ordered for transfusion in the ED --Trend Hbg and transfuse if < 7.0 --Monitor for bleeding   Anorexia / Failure to thrive / Severe protein calorie malnutrition - acute anorexia likely due to Covid infection. Family report pt typically eats well at baseline. Albumin < 1.5 on admission, temporal wasting on exam. --IV hydration --electrolyte replacement --dietitian consulted   Hypokalemia / Hypomagnesemia  - K 3.0, Mg 1.6 on admission.  K and Mg being replaced. Due to poor PO intake. --Monitor and replace as needed   Hypotension, chronic - on midodrine but may not have been getting it due to encephalopathy and anorexia. --continue midodrine 10 mg scheduled 3 times daily --maintain MAP > 65 --telemetry --monitor closely --If blood pressure remains low consider addition of stress dose steroids   Lymphedema of both lower extremities - severe. With secondary wounds per daughter, they have noted increasing skin breakdown recently  at home - likely due to severe malnutrition. --WOC consulted --Lasix on hold due to AKI --Monitor    CKD (chronic kidney disease), stage IIIa - with AKI on admission, as above.  Monitor renal  function. Baseline Cr around 1.1.   Chronic diastolic CHF - Echo 12/5425 in Care Everywhere EF >55%, grade II diastolic dysfunction.  Volume status difficult on exam with severe lymphedema, but given poor PO intake, AKI, dry mucus membranes, pt seems hypovolemic on admission. --monitor volume status with IV hydration --I/O's and daily weights   IBS (irritable bowel syndrome) - report as chronic issue. --home Senna-S PRN continued   Pressure injury of skin of buttock - WOC consulted. Family reported recently worsening skin breakdown and bleeding.   Depression - continue home Paxil  DVT prophylaxis: SQ Lovenox Code Status: Full Family Communication: Daughter at bedside 12/6 Disposition Plan: Status is: Inpatient  Remains inpatient appropriate because: Severe sepsis secondary to COVID-19 infection, AKI, metabolic encephalopathy.  Several days prior to disposition.       Level of care: Telemetry Medical  Consultants:  None  Procedures:  None  Antimicrobials: Remdesivir   Subjective: Patient seen and examined.  Pleasant.  Weak voice.  Objective: Vitals:   10/24/21 1030 10/24/21 1100 10/24/21 1130 10/24/21 1200  BP: (!) 86/43 (!) 79/49 (!) 92/39 (!) 75/45  Pulse:  (!) 110 (!) 115 (!) 111  Resp: (!) 26 (!) 24 (!) 23 17  Temp:      TempSrc:      SpO2: 100% 99% 99% 97%  Weight:        Intake/Output Summary (Last 24 hours) at 10/24/2021 1327 Last data filed at 10/23/2021 2123 Gross per 24 hour  Intake 170 ml  Output --  Net 170 ml   Filed Weights   10/23/21 1354  Weight: (!) 196 kg    Examination:  General exam: Lethargic, easily aroused, appears chronically ill Respiratory system: Poor respiratory effort.  Lung sounds decreased at bases.  No wheeze.  Normal work of breathing.  Room air Cardiovascular system: S1-S2, tachycardic, no murmurs Gastrointestinal system: Thin, soft, NT/ND, normal bowel sounds Central nervous system: Alert and oriented. No focal  neurological deficits. Extremities: Severe bilateral lower extremity lymphedema Skin: Dry, severe BLE venous stasis Psychiatry: Judgement and insight appear impaired. Mood & affect flattened.     Data Reviewed: I have personally reviewed following labs and imaging studies  CBC: Recent Labs  Lab 10/23/21 1000 10/24/21 0645  WBC 13.4* 11.5*  NEUTROABS 11.1* 9.1*  HGB 6.5* 7.5*  HCT 20.1* 21.5*  MCV 86.3 83.0  PLT 220 062   Basic Metabolic Panel: Recent Labs  Lab 10/23/21 0827 10/24/21 0509  NA 140 138  K 3.0* 2.7*  CL 107 106  CO2 23 24  GLUCOSE 61* 67*  BUN 17 18  CREATININE 1.48* 1.54*  CALCIUM 7.1* 6.8*  MG 1.6* 1.6*  PHOS  --  2.1*   GFR: Estimated Creatinine Clearance: 58.6 mL/min (A) (by C-G formula based on SCr of 1.54 mg/dL (H)). Liver Function Tests: Recent Labs  Lab 10/23/21 0827 10/24/21 0509  AST 16 12*  ALT 7 6  ALKPHOS 132* 109  BILITOT 0.7 1.0  PROT 6.0* 5.5*  ALBUMIN <1.5* <1.5*   No results for input(s): LIPASE, AMYLASE in the last 168 hours. No results for input(s): AMMONIA in the last 168 hours. Coagulation Profile: No results for input(s): INR, PROTIME in the last 168 hours. Cardiac Enzymes: No results for input(s): CKTOTAL, CKMB,  CKMBINDEX, TROPONINI in the last 168 hours. BNP (last 3 results) No results for input(s): PROBNP in the last 8760 hours. HbA1C: No results for input(s): HGBA1C in the last 72 hours. CBG: No results for input(s): GLUCAP in the last 168 hours. Lipid Profile: No results for input(s): CHOL, HDL, LDLCALC, TRIG, CHOLHDL, LDLDIRECT in the last 72 hours. Thyroid Function Tests: No results for input(s): TSH, T4TOTAL, FREET4, T3FREE, THYROIDAB in the last 72 hours. Anemia Panel: Recent Labs    10/23/21 0837  FERRITIN 372*  TIBC NOT CALCULATED  IRON 56   Sepsis Labs: Recent Labs  Lab 10/23/21 1000  LATICACIDVEN 1.7    Recent Results (from the past 240 hour(s))  Culture, blood (routine x 2)     Status:  None (Preliminary result)   Collection Time: 10/23/21  8:27 AM   Specimen: BLOOD  Result Value Ref Range Status   Specimen Description BLOOD LEFT HAND  Final   Special Requests   Final    BOTTLES DRAWN AEROBIC ONLY Blood Culture results may not be optimal due to an inadequate volume of blood received in culture bottles   Culture   Final    NO GROWTH 1 DAY Performed at [facility], [address] [place], [place] [address]    Report Status PENDING  Incomplete  Resp Panel by RT-PCR (Flu A&B, Covid) Nasopharyngeal Swab     Status: Abnormal   Collection Time: 10/23/21 10:07 AM   Specimen: Nasopharyngeal Swab; Nasopharyngeal(NP) swabs in vial transport medium  Result Value Ref Range Status   SARS Coronavirus 2 by RT PCR POSITIVE (A) NEGATIVE Final    Comment: RESULT CALLED TO, READ BACK BY AND VERIFIED WITH: MAGGIE BARBRA 10/23/21 1140 (NOTE) SARS-CoV-2 target nucleic acids are DETECTED.  The SARS-CoV-2 RNA is generally detectable in upper respiratory specimens during the acute phase of infection. Positive results are indicative of the presence of the identified virus, but do not rule out bacterial infection or co-infection with other pathogens not detected by the test. Clinical correlation with patient history and other diagnostic information is necessary to determine patient infection status. The expected result is Negative.  Fact Sheet for Patients: EntrepreneurPulse.com.au  Fact Sheet for Healthcare Providers: IncredibleEmployment.be  This test is not yet approved or cleared by the [place] FDA and  has been authorized for detection and/or diagnosis of SARS-CoV-2 by FDA under an Emergency Use Authorization (EUA).  This EUA will remain in effect (meaning this test can be used)  for the duration of  the COVID-19 declaration under Section 564(b)(1) of the Act, 21 U.S.C. section 360bbb-3(b)(1), unless the authorization  is terminated or revoked sooner.     Influenza A by PCR NEGATIVE NEGATIVE Final   Influenza B by PCR NEGATIVE NEGATIVE Final    Comment: (NOTE) The Xpert Xpress SARS-CoV-2/FLU/RSV plus assay is intended as an aid in the diagnosis of influenza from Nasopharyngeal swab specimens and should not be used as a sole basis for treatment. Nasal washings and aspirates are unacceptable for Xpert Xpress SARS-CoV-2/FLU/RSV testing.  Fact Sheet for Patients: EntrepreneurPulse.com.au  Fact Sheet for Healthcare Providers: IncredibleEmployment.be  This test is not yet approved or cleared by the [place] FDA and has been authorized for detection and/or diagnosis of SARS-CoV-2 by FDA under an Emergency Use Authorization (EUA). This EUA will remain in effect (meaning this test can be used) for the duration of the COVID-19 declaration under Section 564(b)(1) of the Act, 21 U.S.C. section 360bbb-3(b)(1), unless the authorization is  terminated or revoked.  Performed at [facility], [address] [place], [place] [address]   Culture, blood (routine x 2)     Status: None (Preliminary result)   Collection Time: 10/23/21 11:21 AM   Specimen: BLOOD  Result Value Ref Range Status   Specimen Description BLOOD RIGHT ANTECUBITAL  Final   Special Requests   Final    BOTTLES DRAWN AEROBIC AND ANAEROBIC Blood Culture adequate volume   Culture   Final    NO GROWTH < 24 HOURS Performed at [facility], [address] [place], [place] [address]    Report Status PENDING  Incomplete         Radiology Studies: DG Chest 2 View  Result Date: 10/23/2021 CLINICAL DATA:  Cough EXAM: CHEST - 2 VIEW COMPARISON:  06/30/2021 FINDINGS: Degraded lateral view secondary to arm position. AP portable radiograph is mildly oblique. Numerous leads and wires project over the chest. Borderline cardiomegaly. No pleural effusion or pneumothorax. No overt congestive  failure. Low lung volumes with resultant pulmonary interstitial prominence. Bibasilar atelectasis. Right shoulder arthroplasty. IMPRESSION: Low lung volumes with bibasilar atelectasis.  No acute findings. Both views are mildly position and technique degraded. Electronically Signed   By: Abigail Miyamoto M.D.   On: 10/23/2021 09:19   CT Head Wo Contrast  Result Date: 10/23/2021 CLINICAL DATA:  Mental status change, unknown cause. Additional history provided: Poor p.o. intake for 3 days, blood pressure 87/40. EXAM: CT HEAD WITHOUT CONTRAST TECHNIQUE: Contiguous axial images were obtained from the base of the skull through the vertex without intravenous contrast. COMPARISON:  Report from brain MRI 07/13/1999 (images unavailable). FINDINGS: Brain: Mild generalized cerebral atrophy. Minimal patchy hypoattenuation within the cerebral white matter, nonspecific but compatible chronic small vessel ischemic disease. Rounded dural-based calcification overlying the anterior left frontal lobe measuring 4 mm, nonspecific but possibly reflecting a tiny calcified meningioma. There is no acute intracranial hemorrhage. No demarcated cortical infarct. No extra-axial fluid collection. No midline shift. Partially empty sella turcica. Vascular: Hyperdense vessel.  Atherosclerotic calcifications. Skull: Normal. Negative for fracture or focal lesion. Sinuses/Orbits: Visualized orbits show no acute finding. No significant paranasal sinus disease at the imaged levels. IMPRESSION: No evidence of acute infarct or acute intracranial hemorrhage. Minimal chronic small-vessel ischemic changes within the cerebral white matter. 4 mm rounded dural-based calcification overlying the anterior left frontal lobe, nonspecific but possibly reflecting a tiny incidental calcified meningioma. Mild generalized cerebral atrophy. Electronically Signed   By: Kellie Simmering D.O.   On: 10/23/2021 09:24   [place] EKG SITE RITE  Result Date: 10/23/2021 If Site Rite image  not attached, placement could not be confirmed due to current cardiac rhythm.       Scheduled Meds:  vitamin C  500 mg Oral Daily   Chlorhexidine Gluconate Cloth  6 each Topical Daily   enoxaparin (LOVENOX) injection  0.5 mg/kg Subcutaneous Q24H   Ipratropium-Albuterol  1 puff Inhalation Q6H   midodrine  10 mg Oral TID WC   multivitamin with minerals  1 tablet Oral Daily   pantoprazole  40 mg Oral QAC breakfast   PARoxetine  30 mg Oral Daily   sodium chloride flush  10-40 mL Intracatheter Q12H   zinc oxide   Topical TID   zinc sulfate  220 mg Oral Daily   Continuous Infusions:  dextrose 5 % and 0.9 % NaCl with KCl 20 mEq/L 50 mL/hr at 10/24/21 0929   remdesivir 100 mg in NS 100 mL  LOS: 1 day    Time spent: 25 minutes    Sidney Ace, MD [facility]   If 7PM-7AM, please contact night-coverage  10/24/2021, 1:27 PM

## 2021-10-24 NOTE — Consult Note (Signed)
Consultation Note Date: 10/24/2021   Patient Name: Kayla Long  DOB: [birth year]  MRN: [id number]  Age / Sex: 75 y.o., female  PCP: Pcp, No Referring Physician: Sidney Ace, MD  Reason for Consultation: Establishing goals of care and Psychosocial/spiritual support  HPI/Patient Profile: 75 y.o. female  with past medical history of hypotension on midodrine, severe lymphedema, morbid obesity with bedbound status/BMI of 69, sacral decubitus present on admission, diastolic heart failure, HTN, IBS, vesicovaginal fistula closure with TAH, admitted on 10/23/2021 with COVID, severe sepsis, acute on chronic kidney disease.   Clinical Assessment and Goals of Care: I have reviewed medical records including EPIC notes, labs and imaging, received report from RN, assessed the patient.  Kayla Long is lying quietly in bed.  Her daughter and bedside nursing staff are present.  Kayla Long makes and somewhat keeps eye contact.  She is alert, able to make her needs known.  We meet to discuss diagnosis prognosis, GOC, EOL wishes, disposition and options.  Although she was seen by palliative medicine earlier this year, I introduced Palliative Medicine as specialized medical care for people living with serious illness. It focuses on providing relief from the symptoms and stress of a serious illness. The goal is to improve quality of life for both the patient and the family.  We discussed a brief life review of the patient.  Kayla Long lives in her own home with 24/7 caregivers.  Although she needs assistance with ADLs, she had been mentally alert.   We then focused on their current illness.  We talked about COVID illness and the treatment plan.  We talked about skin breakdown and the treatment plan for illumination.  The natural disease trajectory and expectations at EOL were discussed.  Advanced directives, concepts  specific to code status, were considered and discussed.  We talked about CODE STATUS.  I asked Kayla Long, if we are doing all these things for her, and her breathing worsens, she is unable to breathe well enough on her own, should we put a tube into her lung, put her on "life support"?  She shakes her head negatively.  I share that indeed, most people do not want this, but the bigger question is, "even if it means you die without it?".  Kayla Long considers her answer.  I reassure her that she does not have to make this choice right now.  I also reassured her that regardless, we will care for her.  I encouraged her to take time to think about, pray about her choices.  Daughter Kayla Long states that she will continue these discussions.  Discussed the importance of continued conversation with family and the medical providers regarding overall plan of care and treatment options, ensuring decisions are within the context of the patient's values and GOCs. Questions and concerns were addressed.  The family was encouraged to call with questions or concerns.  PMT will continue to support holistically.  Conference with attending, bedside nursing staff, transition of care team related to patient condition, needs,  goals of care, disposition.   HCPOA  NEXT OF KIN -Kayla Long names her daughter, Kayla Long, as her healthcare surrogate    SUMMARY OF RECOMMENDATIONS   At this point continue to treat the treatable Time for outcomes Considering CODE STATUS PMT to follow   Code Status/Advance Care Planning: Full code -initially, Kayla Long shakes her head negatively, she would not want life support.  When I ask, "even if it means you die without it?"  She becomes thoughtful.  I encouraged her to think about, pray about these choices and discuss this with her family.  Daughter Kayla Long is at bedside.  Symptom Management:  Per hospitalist, no additional needs at this time.  Palliative Prophylaxis:  Frequent Pain  Assessment and Oral Care  Additional Recommendations (Limitations, Scope, Preferences): Full Scope Treatment  Psycho-social/Spiritual:  Desire for further Chaplaincy support:no Additional Recommendations: Caregiving  Support/Resources  Prognosis:  Unable to determine, based on outcomes.  Guarded at this point.  Discharge Planning:  To be determined, based on outcomes.       Primary Diagnoses: Present on Admission:  COVID-19 virus infection  Acute metabolic encephalopathy  Anorexia  Acute on chronic anemia  Hypomagnesemia  Hypokalemia  Hypotension  Severe sepsis (HCC)  Acute kidney injury superimposed on CKD (HCC)  CKD (chronic kidney disease), stage III (HCC)  Pressure injury of skin of buttock  IBS (irritable bowel syndrome)  Chronic diastolic CHF (congestive heart failure) ([place])  Depression   I have reviewed the medical record, interviewed the patient and family, and examined the patient. The following aspects are pertinent.  Past Medical History:  Diagnosis Date   Hx of colonic polyps    tubulovillous adenoma   Hyperlipidemia    IBS (irritable bowel syndrome)    Spinal stenosis    with secondary neuropathy   Social History   Socioeconomic History   Marital status: Divorced    Spouse name: Not on file   Number of children: Not on file   Years of education: Not on file   Highest education level: Not on file  Occupational History   Not on file  Tobacco Use   Smoking status: Former    Types: Cigarettes   Smokeless tobacco: Never   Tobacco comments:    off and on?  Substance and Sexual Activity   Alcohol use: Yes    Comment: occasional    Drug use: Never   Sexual activity: Not Currently  Other Topics Concern   Not on file  Social History Narrative   Divorced,  2 children.    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   Daughter-[name] [phone]   Social Determinants of Health   Financial Resource  Strain: Not on file  Food Insecurity: Not on file  Transportation Needs: Not on file  Physical Activity: Not on file  Stress: Not on file  Social Connections: Not on file   Family History  Problem Relation Age of Onset   Stroke Mother    Stroke Father    Heart disease Brother    Scheduled Meds:  vitamin C  500 mg Oral Daily   Chlorhexidine Gluconate Cloth  6 each Topical Daily   enoxaparin (LOVENOX) injection  0.5 mg/kg Subcutaneous Q24H   hydrocortisone sod succinate (SOLU-CORTEF) inj  100 mg Intravenous Q8H   Ipratropium-Albuterol  1 puff Inhalation Q6H   midodrine  10 mg Oral TID WC   multivitamin with minerals  1 tablet Oral Daily  pantoprazole  40 mg Oral QAC breakfast   PARoxetine  30 mg Oral Daily   sodium chloride flush  10-40 mL Intracatheter Q12H   zinc oxide   Topical TID   zinc sulfate  220 mg Oral Daily   Continuous Infusions:  albumin human     dextrose 5 % and 0.9 % NaCl with KCl 20 mEq/L 50 mL/hr at 10/24/21 2440   remdesivir 100 mg in NS 100 mL 100 mg (10/24/21 1346)   PRN Meds:.acetaminophen **OR** acetaminophen, bisacodyl, guaiFENesin-dextromethorphan, ondansetron **OR** ondansetron (ZOFRAN) IV, senna-docusate, sodium chloride flush Medications Prior to Admission:  Prior to Admission medications   Medication Sig Start Date End Date Taking? Authorizing Provider  furosemide (LASIX) 40 MG tablet Take 80 mg by mouth daily.     Yes [provider]  midodrine (PROAMATINE) 10 MG tablet Take 1 tablet (10 mg total) by mouth 3 (three) times daily as needed (for SBP <100). 07/11/21  Yes Amin, Ankit Chirag, MD  Oxycodone HCl 10 MG TABS Take 10 mg by mouth 3 (three) times daily as needed (severe pain).   Yes [provider]  pantoprazole (PROTONIX) 40 MG tablet Take 1 tablet (40 mg total) by mouth daily before breakfast. 07/11/21  Yes Amin, Ankit Chirag, MD  PARoxetine (PAXIL) 30 MG tablet Take 30 mg by mouth daily. 04/13/21  Yes [provider]   senna-docusate (SENOKOT-S) 8.6-50 MG tablet Take 1 tablet by mouth at bedtime as needed for moderate constipation. 07/11/21  Yes Amin, Ankit Chirag, MD  SSD 1 % cream Apply 1 application topically daily. (Apply to cracks and affected areas of skin)   Yes [provider]  folic acid (FOLVITE) 1 MG tablet Take 1 tablet (1 mg total) by mouth daily. Patient not taking: Reported on 10/23/2021 07/12/21   Damita Lack, MD  iron polysaccharides (NIFEREX) 150 MG capsule Take 1 capsule (150 mg total) by mouth daily. Patient not taking: Reported on 10/23/2021 07/12/21   Damita Lack, MD   Allergies  Allergen Reactions   Fluoxetine Hcl     REACTION: headache   Review of Systems  Unable to perform ROS: Other   Physical Exam Vitals and nursing note reviewed.  Constitutional:      General: She is not in acute distress.    Appearance: She is obese. She is ill-appearing.  HENT:     Mouth/Throat:     Mouth: Mucous membranes are dry.  Cardiovascular:     Rate and Rhythm: Normal rate.  Pulmonary:     Effort: Pulmonary effort is normal. No respiratory distress.  Musculoskeletal:        General: Swelling present.  Skin:    General: Skin is warm and dry.  Neurological:     Mental Status: She is alert and oriented to person, place, and time.  Psychiatric:        Mood and Affect: Mood normal.        Behavior: Behavior normal.    Vital Signs: BP (!) 103/51   Pulse (!) 118   Temp 98.1 F (36.7 C) (Oral)   Resp (!) 26   Wt (!) 196 kg   SpO2 100%   BMI 69.74 kg/m  Pain Scale: 0-10   Pain Score: Asleep   SpO2: SpO2: 100 % O2 Device:SpO2: 100 % O2 Flow Rate: .   IO: Intake/output summary:  Intake/Output Summary (Last 24 hours) at 10/24/2021 1516 Last data filed at 10/23/2021 2123 Gross per 24 hour  Intake  170 ml  Output --  Net 170 ml    LBM:   Baseline Weight: Weight: (!) 196 kg Most recent weight: Weight: (!) 196 kg     Palliative Assessment/Data:   Flowsheet  Rows    Flowsheet Row Most Recent Value  Intake Tab   Referral Department Hospitalist  Unit at Time of Referral Intermediate Care Unit  Palliative Care Primary Diagnosis Pulmonary  Date Notified 10/23/21  Palliative Care Type Return patient Palliative Care  Reason for referral Clarify Goals of Care  Date of Admission 10/23/21  Date first seen by Palliative Care 10/24/21  # of days Palliative referral response time 1 Day(s)  # of days IP prior to Palliative referral 0  Clinical Assessment   Palliative Performance Scale Score 20%  Pain Max last 24 hours Not able to report  Pain Min Last 24 hours Not able to report  Dyspnea Max Last 24 Hours Not able to report  Dyspnea Min Last 24 hours Not able to report  Psychosocial & Spiritual Assessment   Palliative Care Outcomes        Time In: 1500 Time Out: 1610 Time Total: 70 minutes  Greater than 50%  of this time was spent counseling and coordinating care related to the above assessment and plan.  Signed by: Drue Novel, NP   Please contact Palliative Medicine Team phone at [phone] for questions and concerns.  For individual provider: See Shea Evans

## 2021-10-24 NOTE — Progress Notes (Signed)
MD ordered foley due to condition of skin and breakdown, family prefers foley not be used at this time. MD notified

## 2021-10-24 NOTE — TOC Initial Note (Signed)
Transition of Care [facility]) - Initial/Assessment Note    Patient Details  Name: Kayla Long MRN: [id number] Date of Birth: 04-Mar-1946  [facility] CM/SW Contact:    Shelbie Hutching, RN Phone Number: 10/24/2021, 7:13 PM  Clinical Narrative:                 Patient being admitted to the hospital for [place] 19, from home not eating for 3 days at home.  Patient is from home with 24/7 caregivers, she is bedbound and has been for a long time.  PCP is Dr. Lucia Bitter housecalls.  Palliative Care has been consulted and will follow up with patient and family on [place].   TOC will follow.    Expected Discharge Plan:  (to be determined) Barriers to Discharge: Continued Medical Work up   Patient Goals and CMS Choice Patient states their goals for this hospitalization and ongoing recovery are:: Palliative following for [place] CMS Medicare.gov Compare Post Acute Care list provided to:: Patient Represenative (must comment) Choice offered to / list presented to : Adult Children  Expected Discharge Plan and Services Expected Discharge Plan:  (to be determined)   Discharge Planning Services: CM Consult   Living arrangements for the past 2 months: Single Family Home                 DME Arranged: N/A DME Agency: NA       HH Arranged: NA [place] Agency: NA        Prior Living Arrangements/Services Living arrangements for the past 2 months: [place]   Patient language and need for interpreter reviewed:: Yes Do you feel safe going back to the place where you live?: Yes      Need for Family Participation in Patient Care: Yes (Comment) Care giver support system in place?: Yes (comment) Current home services: DME, Homehealth aide Criminal Activity/Legal Involvement Pertinent to Current Situation/Hospitalization: No - Comment as needed  Activities of Daily Living      Permission Sought/Granted Permission sought to share information with : Case Manager, Family Supports, Other  (comment) Permission granted to share information with : Yes, Verbal Permission Granted  Share Information with NAME: Mieke Brinley     Permission granted to share info w Relationship: daughter  Permission granted to share info w Contact Information: [phone]  Emotional Assessment         Alcohol / Substance Use: Not Applicable Psych Involvement: No (comment)  Admission diagnosis:  COVID-19 virus infection [U07.1] Patient Active Problem List   Diagnosis Date Noted   COVID-19 virus infection 81/82/9937   Acute metabolic encephalopathy 16/96/7893   Anorexia 10/23/2021   Acute on chronic anemia 10/23/2021   Hypomagnesemia 10/23/2021   Hypokalemia 10/23/2021   Hypotension 10/23/2021   Acute kidney injury superimposed on CKD ([place]) 10/23/2021   CKD (chronic kidney disease), stage III ([place]) 10/23/2021   Pressure injury of skin of buttock 10/23/2021   Chronic diastolic CHF (congestive heart failure) (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   LEG  EDEMA 06/07/2008   HYPERLIPIDEMIA 06/10/2007   NEUROPATHY 06/10/2007   HYPERTENSION 06/10/2007   IBS 06/10/2007   ARTHRITIS, SPINE 06/10/2007   SPINAL STENOSIS 06/10/2007   PCP:  Merryl Hacker No Pharmacy:   [facility] [address] [place] [address] [place] ([place] [address] Phone: [phone] Fax: [phone]     Social Determinants of Health (SDOH) Interventions    Readmission Risk  Interventions Readmission Risk Prevention Plan 10/24/2021  Transportation Screening Complete  PCP or Specialist Appt within 3-5 Days Complete  HRI or [place] Complete  Social Work Consult for [place] Planning/Counseling Complete  Palliative Care Screening Complete  Medication Review Press photographer) Complete  Some recent data might be hidden

## 2021-10-24 NOTE — Care Management (Signed)
Patient remains hypotensive, though suspect she may not be far off her baseline.  Will initiate BP support  Plan: Continue midodrine 10 TID Start stress dose steroids hydrocortisone 100q8h Albumin 25g of 25% x 1  Admit to PCU  Ralene Muskrat MD

## 2021-10-24 NOTE — ED Notes (Signed)
Critical K+ given to [place] md

## 2021-10-25 ENCOUNTER — Encounter: Payer: Self-pay | Admitting: Internal Medicine

## 2021-10-25 DIAGNOSIS — U071 COVID-19: Secondary | ICD-10-CM | POA: Diagnosis not present

## 2021-10-25 LAB — COMPREHENSIVE METABOLIC PANEL
ALT: 6 U/L (ref 0–44)
AST: 9 U/L — ABNORMAL LOW (ref 15–41)
Albumin: 1.5 g/dL — ABNORMAL LOW (ref 3.5–5.0)
Alkaline Phosphatase: 97 U/L (ref 38–126)
Anion gap: 6 (ref 5–15)
BUN: 20 mg/dL (ref 8–23)
CO2: 22 mmol/L (ref 22–32)
Calcium: 6.7 mg/dL — ABNORMAL LOW (ref 8.9–10.3)
Chloride: 111 mmol/L (ref 98–111)
Creatinine, Ser: 1.58 mg/dL — ABNORMAL HIGH (ref 0.44–1.00)
GFR, Estimated: 34 mL/min — ABNORMAL LOW (ref >60)
Glucose, Bld: 237 mg/dL — ABNORMAL HIGH (ref 70–99)
Potassium: 3.5 mmol/L (ref 3.5–5.1)
Sodium: 139 mmol/L (ref 135–145)
Total Bilirubin: 0.8 mg/dL (ref 0.3–1.2)
Total Protein: 5.4 g/dL — ABNORMAL LOW (ref 6.5–8.1)

## 2021-10-25 LAB — CBC WITH DIFFERENTIAL/PLATELET
Abs Immature Granulocytes: 0.08 10*3/uL — ABNORMAL HIGH (ref 0.00–0.07)
Basophils Absolute: 0 10*3/uL (ref 0.0–0.1)
Basophils Relative: 0 %
Eosinophils Absolute: 0 10*3/uL (ref 0.0–0.5)
Eosinophils Relative: 0 %
HCT: 19.7 % — ABNORMAL LOW (ref 36.0–46.0)
Hemoglobin: 6.8 g/dL — ABNORMAL LOW (ref 12.0–15.0)
Immature Granulocytes: 1 %
Lymphocytes Relative: 6 %
Lymphs Abs: 0.8 10*3/uL (ref 0.7–4.0)
MCH: 29.2 pg (ref 26.0–34.0)
MCHC: 34.5 g/dL (ref 30.0–36.0)
MCV: 84.5 fL (ref 80.0–100.0)
Monocytes Absolute: 0.3 10*3/uL (ref 0.1–1.0)
Monocytes Relative: 2 %
Neutro Abs: 13 10*3/uL — ABNORMAL HIGH (ref 1.7–7.7)
Neutrophils Relative %: 91 %
Platelets: 163 10*3/uL (ref 150–400)
RBC: 2.33 MIL/uL — ABNORMAL LOW (ref 3.87–5.11)
RDW: 16.2 % — ABNORMAL HIGH (ref 11.5–15.5)
Smear Review: NORMAL
WBC: 14.3 10*3/uL — ABNORMAL HIGH (ref 4.0–10.5)
nRBC: 0 % (ref 0.0–0.2)

## 2021-10-25 LAB — MAGNESIUM: Magnesium: 1.8 mg/dL (ref 1.7–2.4)

## 2021-10-25 LAB — PREPARE RBC (CROSSMATCH)

## 2021-10-25 LAB — PHOSPHORUS: Phosphorus: 2.4 mg/dL — ABNORMAL LOW (ref 2.5–4.6)

## 2021-10-25 MED ORDER — ENSURE MAX PROTEIN PO LIQD
11.0000 [oz_av] | Freq: Three times a day (TID) | ORAL | Status: DC
  Administered 2021-10-25: 11 [oz_av] via ORAL
  Filled 2021-10-25: qty 330

## 2021-10-25 MED ORDER — FUROSEMIDE 10 MG/ML IJ SOLN
40.0000 mg | Freq: Once | INTRAMUSCULAR | Status: AC
  Administered 2021-10-25: 40 mg via INTRAVENOUS
  Filled 2021-10-25: qty 4

## 2021-10-25 MED ORDER — ENSURE MAX PROTEIN PO LIQD
11.0000 [oz_av] | Freq: Two times a day (BID) | ORAL | Status: DC
  Filled 2021-10-25: qty 330

## 2021-10-25 MED ORDER — ENSURE MAX PROTEIN PO LIQD
11.0000 [oz_av] | Freq: Three times a day (TID) | ORAL | Status: DC
Start: 2021-10-25 — End: 2021-10-26
  Administered 2021-10-25 (×2): 11 [oz_av] via ORAL
  Filled 2021-10-25: qty 330

## 2021-10-25 MED ORDER — SODIUM CHLORIDE 0.9% IV SOLUTION
Freq: Once | INTRAVENOUS | Status: DC
  Filled 2021-10-25: qty 250

## 2021-10-25 MED ORDER — ALBUMIN HUMAN 25 % IV SOLN
12.5000 g | Freq: Once | INTRAVENOUS | Status: AC
  Administered 2021-10-25: 12.5 g via INTRAVENOUS
  Filled 2021-10-25: qty 50

## 2021-10-25 NOTE — ED Notes (Signed)
Pt transferred to a room with a lift.  Lift pad placed under patient, patient cleaned and new sheets and underpad provided.  Pt tolerated well.

## 2021-10-25 NOTE — Evaluation (Addendum)
Clinical/Bedside Swallow Evaluation Patient Details  Name: Kayla Long MRN: [id number] Date of Birth: 1946/09/02  Today's Date: 10/25/2021 Time: SLP Start Time (ACUTE ONLY): 73 SLP Stop Time (ACUTE ONLY): 49 SLP Time Calculation (min) (ACUTE ONLY): 25 min  Past Medical History:  Past Medical History:  Diagnosis Date   Hx of colonic polyps    tubulovillous adenoma   Hyperlipidemia    IBS (irritable bowel syndrome)    Spinal stenosis    with secondary neuropathy   Past Surgical History:  Past Surgical History:  Procedure Laterality Date   PARTIAL HYSTERECTOMY     vaginal deliveries     x2   VESICOVAGINAL FISTULA CLOSURE W/ TAH     HPI:  Kayla Long is a 75 y.o. female with medical history significant of hypotension on midodrine, severe lymphedema, bedbound status, diastolic CHF, hypertension presented to the ED on morning of 10/23/2021 with reports by family of very poor p.o. intake and worsening confusion for the past 3 days.  EMS was called to the home and found patient's blood pressure 87/40.  Patient had been unable to take her midodrine.  Family reported patient recently having cough, very poor appetite with little p.o. intake but no apparent abdominal pain nausea or vomiting.  Family reported patient's mental status nowhere near baseline.  She is typically conversant, witty and quite interactive but has been more withdrawn.  Exposed to COVID-19  In a family member about a week prior.  Family reported patient had more oozing from her decubitus wounds recently. Head CT 10/23/21: No evidence of acute infarct or acute intracranial hemorrhage. Minimal chronic small-vessel ischemic changes within the cerebral  white matter. 4 mm rounded dural-based calcification overlying the anterior left  frontal lobe, nonspecific but possibly reflecting a tiny incidental  calcified meningioma. Mild generalized cerebral atrophy. Chest XR 10/23/21: Low lung volumes with bibasilar atelectasis.   No acute findings. Both views are mildly position and technique degraded.    Assessment / Plan / Recommendation  Clinical Impression  Pt presents with oropharyngeal dysphagia in the setting of Covid 19 virus infection, severe sepsis, acute metabolic encephalopathy, and failure to thrive. Pt with inconsistent s/sx of aspiration (delayed/immediate cough and throat clear) t/o trials of thin liquid (spoon and cup), puree, and nectar thick liquids (cup). No significant difference noted with performance with thin liquid vs nectar think liquids. Pt with increased oral control with presentation of thin liquid via spoon, leading to increased oral containment and clearance. Oral phase impacted by generalized oral weakness, leading to impaired labial seal/pull on straw and labial closure on cup. Reduced oral manipulation with puree trials, leading to benefit of extended time for completion of oral clearance. Daughter reports minimal intake and that family had restricted diet to puree and ice chips directly prior to hospitalization based on patient preference.   Overall pt is at risk of aspiration based on current deconditioning and performance with PO trials; therefore the most conservative recommendation would be NPO. More liberal recommendations would be thin liquid (via teaspoon) and puree solids with strict aspiration precautions (slow rate, small bites, elevated HOB, and alert for PO intake), close monitoring of respiratory status, assist with intake, and medications administered via alternative means when possible vs crushed in puree. Options presented to daughter, who reported desire to attempt PO diet. Education shared regarding the risk of aspiration and recommendation to only feed pt when pt desires to eat and is awake. Given recent decline in intake and suspicion of ongoing  restricted intake, concern for meeting adequate nutrition persists. Recommend dietician consult. MD/RN aware of results, recommendations,  and risks for aspiration. SLP will continue to follow.  SLP Visit Diagnosis: Dysphagia, oropharyngeal phase (R13.12)    Aspiration Risk  Moderate aspiration risk    Diet Recommendation     Medication Administration: Via alternative means (crushed in puree if medication wont allow)    Other  Recommendations Recommended Consults: Other (Comment) (dietician) Oral Care Recommendations: Oral care BID    Recommendations for follow up therapy are one component of a multi-disciplinary discharge planning process, led by the attending physician.  Recommendations may be updated based on patient status, additional functional criteria and insurance authorization.  Follow up Recommendations  (to be determined)      Assistance Recommended at Discharge Frequent or constant Supervision/Assistance  Functional Status Assessment Patient has had a recent decline in their functional status and/or demonstrates limited ability to make significant improvements in function in a reasonable and predictable amount of time  Frequency and Duration min 2x/week  2 weeks       Prognosis Prognosis for Safe Diet Advancement: Guarded Barriers to Reach Goals: Cognitive deficits;Motivation;Severity of deficits      Swallow Study   General Date of Onset: 10/21/21 (Per daughter reduced intake and coughing sine last weekend) HPI: Kayla Long is a 75 y.o. female with medical history significant of hypotension on midodrine, severe lymphedema, bedbound status, diastolic CHF, hypertension presented to the ED on morning of 10/23/2021 with reports by family of very poor p.o. intake and worsening confusion for the past 3 days.  EMS was called to the home and found patient's blood pressure 87/40.  Patient had been unable to take her midodrine.  Family reported patient recently having cough, very poor appetite with little p.o. intake but no apparent abdominal pain nausea or vomiting.  Family reported patient's mental status  nowhere near baseline.  She is typically conversant, witty and quite interactive but has been more withdrawn.  Exposed to COVID-19  In a family member about a week prior.  Family reported patient had more oozing from her decubitus wounds recently. Head CT 10/23/21: No evidence of acute infarct or acute intracranial hemorrhage. Minimal chronic small-vessel ischemic changes within the cerebral  white matter. 4 mm rounded dural-based calcification overlying the anterior left  frontal lobe, nonspecific but possibly reflecting a tiny incidental  calcified meningioma. Mild generalized cerebral atrophy. Chest XR 10/23/21: Low lung volumes with bibasilar atelectasis.  No acute findings. Both views are mildly position and technique degraded. Type of Study: Bedside Swallow Evaluation Previous Swallow Assessment: none in chart Diet Prior to this Study: NPO Temperature Spikes Noted: No (WBC 14.3 trending up) Respiratory Status: Room air History of Recent Intubation: No Behavior/Cognition: Alert;Confused;Distractible;Requires cueing Oral Cavity Assessment: Dry Oral Care Completed by SLP: No Self-Feeding Abilities: Needs assist Patient Positioning: Upright in bed Baseline Vocal Quality: Hoarse;Low vocal intensity (Daughter reports significant difference from baseline) Volitional Cough: Cognitively unable to elicit Volitional Swallow: Unable to elicit    Oral/Motor/Sensory Function Overall Oral Motor/Sensory Function: Generalized oral weakness Facial Symmetry: Within Functional Limits Lingual Symmetry: Within Functional Limits   Ice Chips Ice chips: Impaired Presentation: Spoon Oral Phase Impairments: Reduced labial seal;Poor awareness of bolus Oral Phase Functional Implications: Oral holding Pharyngeal Phase Impairments: Suspected delayed Swallow   Thin Liquid Thin Liquid: Impaired Presentation: Cup;Straw Oral Phase Impairments: Reduced labial seal;Poor awareness of bolus Oral Phase Functional  Implications: Oral holding;Other (comment) (reduced labial seal (bilateral) leading to  challenge with straw administration) Pharyngeal  Phase Impairments: Suspected delayed Swallow;Throat Clearing - Delayed;Throat Clearing - Immediate;Cough - Delayed    Nectar Thick Nectar Thick Liquid: Impaired Presentation: Cup Oral Phase Impairments: Reduced labial seal;Poor awareness of bolus Oral phase functional implications: Prolonged oral transit Pharyngeal Phase Impairments: Throat Clearing - Delayed;Cough - Delayed   Honey Thick Honey Thick Liquid: Not tested   Puree Puree: Impaired Presentation: Spoon Oral Phase Impairments: Reduced labial seal;Poor awareness of bolus Oral Phase Functional Implications: Prolonged oral transit;Oral residue Pharyngeal Phase Impairments: Cough - Delayed;Cough - Immediate   Solid     Solid: Not tested     [place] Villa Burgin MS CCC-SLP [place] C Torie Towle 10/25/2021,12:39 PM

## 2021-10-25 NOTE — Progress Notes (Signed)
PROGRESS NOTE    Kayla Long  RDE:[id number] DOB: July 16, 1946 DOA: 10/23/2021 PCP: Pcp, No  110A/110A-AA   Assessment & Plan:   Principal Problem:   COVID-19 virus infection Active Problems:   Lymphedema of both lower extremities   IBS (irritable bowel syndrome)   Severe sepsis (HCC)   Acute metabolic encephalopathy   Anorexia   Acute on chronic anemia   Hypomagnesemia   Hypokalemia   Hypotension   Acute kidney injury superimposed on CKD (HCC)   CKD (chronic kidney disease), stage III (HCC)   Pressure injury of skin of buttock   Chronic diastolic CHF (congestive heart failure) ([place])   Depression   75 y.o. female with medical history significant of hypotension on midodrine, severe lymphedema, bedbound status, diastolic CHF, hypertension presented to the ED on morning of 10/23/2021 with reports by family of very poor p.o. intake and worsening confusion for the past 3 days.  EMS was called to the home and found patient's blood pressure 87/40.  Patient had been unable to take her midodrine.  Family reported patient recently having cough, very poor appetite with little p.o. intake but no apparent abdominal pain nausea or vomiting.  Family reported patient's mental status nowhere near baseline.  She is typically conversant, witty and quite interactive but has been more withdrawn.  Exposed to COVID-19 from a family member about a week prior.  Family reported patient had more oozing from her decubitus wounds recently.   COVID-19 virus infection - POA with sick family contact about one week prior. Plan: --cont Remdesivir --no steroids given lack of hypoxia   Severe sepsis - due to Covid-19. POA with tachycardia, leukocytosis, hypotension,  AKI and encephalopathy consistent with organ dysfunction.  Lactate normal. --treat underlying infection as outlined --monitor hemodynamics closely   Acute metabolic encephalopathy  multifactorial due to Covid-19 infection, dehydration,  worsened anemia, renal failure. --treat underlying causes as outlined --delirium precautions   Acute kidney injury superimposed on CKD stage IIIa - POA  --presented with Cr 1.48, baseline Cr around 1.1.  Likely pre-renal in setting of poor PO intake and hypotension.   Given 1L of LR in the ED. --d/c IVF   Acute on chronic anemia  - baseline Hbg 7.7 three months ago, presented with Hbg 6.5.  Family report increased blood from wounds recently, so likely acute on chronic blood loss. --1 unit pRBC's ordered for transfusion in the ED Plan: --Trend Hbg and transfuse if < 7.0 --another 1u pRBC today for Hgb 6.8   Anorexia / Failure to thrive  Severe hypoalbuminemia  Dysphagia --albumin low around 1.5 since Aug 2022. --noted to have reduced oral intake and difficulty swallowing meat --SLP rec Dys 1 with thin fluid --start Ensure max protein TID  Lymphedema of both lower extremities - severe --fluid buildup exacerbated by low albumin.  BLE with hardened, cobblestone skin texture. --takes oral lasix at home Plan: --trial IV lasix 40 mg with IV albumin support  Hypokalemia / Hypomagnesemia   --monitor and replete PRN   Hypotension, chronic  --Low BP exacerbated by low albumin and fluid 3rd spacing. Plan: --cont midodrine --d/c stress dose steroid --BP goal, systolic >63'J or MAP >49.   Chronic diastolic CHF - Echo 05/262 in Care Everywhere EF >55%, grade II diastolic dysfunction.   --received IVF on presentation. --d/c IVF --trail IV lasix for volume removal   IBS (irritable bowel syndrome)  - report as chronic issue. --home Senna-S PRN continued   Pressure injury of skin of  buttock - Family reported recently worsening skin breakdown and bleeding. WOC consulted. --wound care per order   Depression  - continue home Paxil   DVT prophylaxis: Lovenox SQ Code Status: Full code  Family Communication: daughter updated at bedside today Level of care: Med-Surg Dispo:   The  patient is from: home Anticipated d/c is to: home Anticipated d/c date is: 2-3 days Patient currently is not medically ready to d/c due to: Monitor for BP, electrolytes and Hgb.  On IV lasix.   Subjective and Interval History:  Pt couldn't verbalize any specific complaints.  Daughter said pt's was not back to her baseline sharp mental status.  Daughter noted decline in oral intake, and pt refusing meat.     Objective: Vitals:   10/25/21 1345 10/25/21 1430 10/25/21 1500 10/25/21 1705  BP: (!) 91/54 (!) 90/49 (!) 94/41 (!) 90/48  Pulse: 87 86 89 89  Resp: 17   18  Temp:    (!) 97.5 F (36.4 C)  TempSrc:    Oral  SpO2: 99% 99% 99% 100%  Weight:        Intake/Output Summary (Last 24 hours) at 10/25/2021 1817 Last data filed at 10/25/2021 1355 Gross per 24 hour  Intake 418.96 ml  Output --  Net 418.96 ml   Filed Weights   10/23/21 1354  Weight: (!) 196 kg    Examination:   Constitutional: NAD, awake, slow to answer questions HEENT: conjunctivae and lids normal, EOMI CV: No cyanosis.   RESP: normal respiratory effort, on RA Extremities: severe lymphedema SKIN: hardened, cobblestone skin texture with BLE.   Data Reviewed: I have personally reviewed following labs and imaging studies  CBC: Recent Labs  Lab 10/23/21 1000 10/24/21 0645 10/25/21 0612  WBC 13.4* 11.5* 14.3*  NEUTROABS 11.1* 9.1* 13.0*  HGB 6.5* 7.5* 6.8*  HCT 20.1* 21.5* 19.7*  MCV 86.3 83.0 84.5  PLT 220 178 545   Basic Metabolic Panel: Recent Labs  Lab 10/23/21 0827 10/24/21 0509 10/25/21 0612  NA 140 138 139  K 3.0* 2.7* 3.5  CL 107 106 111  CO2 23 24 22   GLUCOSE 61* 67* 237*  BUN 17 18 20   CREATININE 1.48* 1.54* 1.58*  CALCIUM 7.1* 6.8* 6.7*  MG 1.6* 1.6* 1.8  PHOS  --  2.1* 2.4*   GFR: Estimated Creatinine Clearance: 57.1 mL/min (A) (by C-G formula based on SCr of 1.58 mg/dL (H)). Liver Function Tests: Recent Labs  Lab 10/23/21 0827 10/24/21 0509 10/25/21 0612  AST 16 12* 9*   ALT 7 6 6   ALKPHOS 132* 109 97  BILITOT 0.7 1.0 0.8  PROT 6.0* 5.5* 5.4*  ALBUMIN <1.5* <1.5* 1.5*   No results for input(s): LIPASE, AMYLASE in the last 168 hours. No results for input(s): AMMONIA in the last 168 hours. Coagulation Profile: No results for input(s): INR, PROTIME in the last 168 hours. Cardiac Enzymes: No results for input(s): CKTOTAL, CKMB, CKMBINDEX, TROPONINI in the last 168 hours. BNP (last 3 results) No results for input(s): PROBNP in the last 8760 hours. HbA1C: No results for input(s): HGBA1C in the last 72 hours. CBG: No results for input(s): GLUCAP in the last 168 hours. Lipid Profile: No results for input(s): CHOL, HDL, LDLCALC, TRIG, CHOLHDL, LDLDIRECT in the last 72 hours. Thyroid Function Tests: No results for input(s): TSH, T4TOTAL, FREET4, T3FREE, THYROIDAB in the last 72 hours. Anemia Panel: Recent Labs    10/23/21 0837  FERRITIN 372*  TIBC NOT CALCULATED  IRON 56  Sepsis Labs: Recent Labs  Lab 10/23/21 1000  LATICACIDVEN 1.7    Recent Results (from the past 240 hour(s))  Culture, blood (routine x 2)     Status: None (Preliminary result)   Collection Time: 10/23/21  8:27 AM   Specimen: BLOOD  Result Value Ref Range Status   Specimen Description BLOOD LEFT HAND  Final   Special Requests   Final    BOTTLES DRAWN AEROBIC ONLY Blood Culture results may not be optimal due to an inadequate volume of blood received in culture bottles   Culture   Final    NO GROWTH 2 DAYS Performed at [facility], [address], [place], [place] [address]    Report Status PENDING  Incomplete  Resp Panel by RT-PCR (Flu A&B, Covid) Nasopharyngeal Swab     Status: Abnormal   Collection Time: 10/23/21 10:07 AM   Specimen: Nasopharyngeal Swab; Nasopharyngeal(NP) swabs in vial transport medium  Result Value Ref Range Status   SARS Coronavirus 2 by RT PCR POSITIVE (A) NEGATIVE Final    Comment: RESULT CALLED TO, READ BACK BY AND VERIFIED  WITH: MAGGIE BARBRA 10/23/21 1140 (NOTE) SARS-CoV-2 target nucleic acids are DETECTED.  The SARS-CoV-2 RNA is generally detectable in upper respiratory specimens during the acute phase of infection. Positive results are indicative of the presence of the identified virus, but do not rule out bacterial infection or co-infection with other pathogens not detected by the test. Clinical correlation with patient history and other diagnostic information is necessary to determine patient infection status. The expected result is Negative.  Fact Sheet for Patients: EntrepreneurPulse.com.au  Fact Sheet for Healthcare Providers: IncredibleEmployment.be  This test is not yet approved or cleared by the [place] FDA and  has been authorized for detection and/or diagnosis of SARS-CoV-2 by FDA under an Emergency Use Authorization (EUA).  This EUA will remain in effect (meaning this test can be used)  for the duration of  the COVID-19 declaration under Section 564(b)(1) of the Act, 21 U.S.C. section 360bbb-3(b)(1), unless the authorization is terminated or revoked sooner.     Influenza A by PCR NEGATIVE NEGATIVE Final   Influenza B by PCR NEGATIVE NEGATIVE Final    Comment: (NOTE) The Xpert Xpress SARS-CoV-2/FLU/RSV plus assay is intended as an aid in the diagnosis of influenza from Nasopharyngeal swab specimens and should not be used as a sole basis for treatment. Nasal washings and aspirates are unacceptable for Xpert Xpress SARS-CoV-2/FLU/RSV testing.  Fact Sheet for Patients: EntrepreneurPulse.com.au  Fact Sheet for Healthcare Providers: IncredibleEmployment.be  This test is not yet approved or cleared by the [place] FDA and has been authorized for detection and/or diagnosis of SARS-CoV-2 by FDA under an Emergency Use Authorization (EUA). This EUA will remain in effect (meaning this test can be used) for  the duration of the COVID-19 declaration under Section 564(b)(1) of the Act, 21 U.S.C. section 360bbb-3(b)(1), unless the authorization is terminated or revoked.  Performed at [facility], [address] [place], [place] [address]   Culture, blood (routine x 2)     Status: None (Preliminary result)   Collection Time: 10/23/21 11:21 AM   Specimen: BLOOD  Result Value Ref Range Status   Specimen Description BLOOD RIGHT ANTECUBITAL  Final   Special Requests   Final    BOTTLES DRAWN AEROBIC AND ANAEROBIC Blood Culture adequate volume   Culture   Final    NO GROWTH 2 DAYS Performed at [facility], [address],  [place], [place] [address]    Report Status PENDING  Incomplete      Radiology Studies: No results found.   Scheduled Meds:  sodium chloride   Intravenous Once   vitamin C  500 mg Oral Daily   Chlorhexidine Gluconate Cloth  6 each Topical Daily   enoxaparin (LOVENOX) injection  0.5 mg/kg Subcutaneous Q24H   furosemide  40 mg Intravenous Once   Ipratropium-Albuterol  1 puff Inhalation Q6H   midodrine  10 mg Oral TID WC   multivitamin with minerals  1 tablet Oral Daily   pantoprazole  40 mg Oral QAC breakfast   PARoxetine  30 mg Oral Daily   Ensure Max Protein  11 oz Oral TID   sodium chloride flush  10-40 mL Intracatheter Q12H   zinc oxide   Topical TID   zinc sulfate  220 mg Oral Daily   Continuous Infusions:  albumin human     remdesivir 100 mg in NS 100 mL Stopped (10/25/21 1137)     LOS: 2 days     Enzo Bi, MD [facility] If 7PM-7AM, please contact night-coverage 10/25/2021, 6:17 PM

## 2021-10-25 NOTE — Consult Note (Signed)
[place] Nurse wound follow up Patient receiving care in [facility] ED 3. Wound type: [id number] to buttocks, sacrum, inner upper thighs, posterior upper thighs. Patient refuses the use of a foley catheter at this time. Measurement: Wound bed: pink, shallow Per Opal Sidles, RN assisting with care yesterday. Drainage (amount, consistency, odor) none Periwound: Dressing procedure/placement/frequency: Please continue the use of 20% zinc ointment for healing and protection.  If primary nursing staff agree, replace the current bariatric bed with a bariatric bed with air mattress.   Val Riles, RN, MSN, CWOCN, CNS-BC, pager [phone]

## 2021-10-25 NOTE — ED Notes (Signed)
Pt noted after last bite of applesauce with her medications in it, that she began belching and coughing.  MD notified for possible speech evaluation to be completed.

## 2021-10-25 NOTE — ED Notes (Signed)
Kara RN aware of assigned bed 

## 2021-10-25 NOTE — ED Notes (Signed)
Full linen change, zinc applied to pt's bottom, pads changed on this pt, roll sheet applied under pt.

## 2021-10-25 NOTE — ED Notes (Signed)
Daughter and speech therapy at bedside. Pt given warm blankets

## 2021-10-25 NOTE — Progress Notes (Signed)
Initial Nutrition Assessment  DOCUMENTATION CODES:   Morbid obesity  INTERVENTION:   -Magic cup TID with meals, each supplement provides 290 kcal and 9 grams of protein  -Ensure Max po BID, each supplement provides 150 kcal and 30 grams of protein.   -MVI with minerals daily  NUTRITION DIAGNOSIS:   Increased nutrient needs related to acute illness (COVID-19) as evidenced by estimated needs.  GOAL:   Patient will meet greater than or equal to 90% of their needs  MONITOR:   PO intake, Diet advancement, Supplement acceptance, Labs, Weight trends, Skin, I & O's  REASON FOR ASSESSMENT:   Consult Assessment of nutrition requirement/status  ASSESSMENT:   Kayla Long is a 75 y.o. female with medical history significant of hypotension on midodrine, severe lymphedema, bedbound status, diastolic CHF, hypertension presented to the ED on morning of 10/23/2021 with reports by family of very poor p.o. intake and worsening confusion for the past 3 days.  EMS was called to the home and found patient's blood pressure 87/40.  Patient had been unable to take her midodrine.  Family reported patient recently having cough, very poor appetite with little p.o. intake but no apparent abdominal pain nausea or vomiting.  Family reported patient's mental status nowhere near baseline.  She is typically conversant, witty and quite interactive but has been more withdrawn.  Exposed to COVID-19  Pt admitted with COVID-19 infection.   12/7- s/p BSE- dysphagia 1 diet with thin liquids  Reviewed I/O's: +102 ml x 24 hours and +1.4 L since admission   Per [facility] notes, pt with MASD to buttocks, sacrum, inner upper thighs, posterior upper thighs.  Per SLP notes, pt with decreased oral intake for 3  daysPTA. SHe had had minimal intake and diet consisted of purees and ice chips.   Medications reviewed and include vitamin C, zinc sulfate, and remdesivir.   Obesity is a complex, chronic medical condition that  is optimally managed by a multidisciplinary care team. Weight loss is not an ideal goal for an acute inpatient hospitalization. However, if further work-up for obesity is warranted, consider outpatient referral to outpatient bariatric service and/or [facility].    Albumin has a half-life of 21 days and is strongly affected by stress response and inflammatory process, therefore, do not expect to see an improvement in this lab value during acute hospitalization. When a patient presents with low albumin, it is likely skewed due to the acute inflammatory response.  Unless it is suspected that patient had poor PO intake or malnutrition prior to admission, then RD should not be consulted solely for low albumin. Note that low albumin is no longer used to diagnose malnutrition; [facility] uses the new malnutrition guidelines published by the American Society for Parenteral and Enteral Nutrition (A.S.P.E.N.) and the Academy of Nutrition and Dietetics (AND).     Labs reviewed.    Diet Order:   Diet Order             DIET - DYS 1 Room service appropriate? Yes; Fluid consistency: Thin  Diet effective now                   EDUCATION NEEDS:   No education needs have been identified at this time  Skin:  Skin Assessment: Skin Integrity Issues: Skin Integrity Issues:: Other (Comment) Other: MASD to buttocks, sacrum, inner upper thighs, posterior upper thighs  Last BM:  Unknown  Height:   Ht Readings from Last 1  Encounters:  07/01/21 5\' 6"  (1.676 m)    Weight:   Wt Readings from Last 1 Encounters:  10/23/21 (!) 196 kg    Ideal Body Weight:  59.1 kg  BMI:  Body mass index is 69.74 kg/m.  Estimated Nutritional Needs:   Kcal:  2150-2350  Protein:  125-150 grams  Fluid:  > 2 L    Loistine Chance, RD, LDN, [place] Registered Dietitian II Certified Diabetes Care and Education Specialist Please refer to [facility] for RD and/or RD  on-call/weekend/after hours pager

## 2021-10-26 DIAGNOSIS — U071 COVID-19: Secondary | ICD-10-CM | POA: Diagnosis not present

## 2021-10-26 DIAGNOSIS — I5032 Chronic diastolic (congestive) heart failure: Secondary | ICD-10-CM

## 2021-10-26 DIAGNOSIS — L89309 Pressure ulcer of unspecified buttock, unspecified stage: Secondary | ICD-10-CM | POA: Diagnosis not present

## 2021-10-26 DIAGNOSIS — N179 Acute kidney failure, unspecified: Secondary | ICD-10-CM

## 2021-10-26 DIAGNOSIS — N189 Chronic kidney disease, unspecified: Secondary | ICD-10-CM

## 2021-10-26 DIAGNOSIS — E43 Unspecified severe protein-calorie malnutrition: Secondary | ICD-10-CM | POA: Insufficient documentation

## 2021-10-26 LAB — TYPE AND SCREEN
ABO/RH(D): B POS
Antibody Screen: NEGATIVE
Unit division: 0
Unit division: 0

## 2021-10-26 LAB — CBC
HCT: 23 % — ABNORMAL LOW (ref 36.0–46.0)
Hemoglobin: 8.2 g/dL — ABNORMAL LOW (ref 12.0–15.0)
MCH: 29.6 pg (ref 26.0–34.0)
MCHC: 35.7 g/dL (ref 30.0–36.0)
MCV: 83 fL (ref 80.0–100.0)
Platelets: 147 10*3/uL — ABNORMAL LOW (ref 150–400)
RBC: 2.77 MIL/uL — ABNORMAL LOW (ref 3.87–5.11)
RDW: 15.9 % — ABNORMAL HIGH (ref 11.5–15.5)
WBC: 16.3 10*3/uL — ABNORMAL HIGH (ref 4.0–10.5)
nRBC: 0 % (ref 0.0–0.2)

## 2021-10-26 LAB — BPAM RBC
Blood Product Expiration Date: 202212202359
Blood Product Expiration Date: 202212292359
ISSUE DATE / TIME: 202212051646
ISSUE DATE / TIME: 202212071103
Unit Type and Rh: 1700
Unit Type and Rh: 7300

## 2021-10-26 LAB — BASIC METABOLIC PANEL
Anion gap: 8 (ref 5–15)
BUN: 22 mg/dL (ref 8–23)
CO2: 23 mmol/L (ref 22–32)
Calcium: 7.2 mg/dL — ABNORMAL LOW (ref 8.9–10.3)
Chloride: 108 mmol/L (ref 98–111)
Creatinine, Ser: 1.7 mg/dL — ABNORMAL HIGH (ref 0.44–1.00)
GFR, Estimated: 31 mL/min — ABNORMAL LOW (ref >60)
Glucose, Bld: 132 mg/dL — ABNORMAL HIGH (ref 70–99)
Potassium: 2.6 mmol/L — CL (ref 3.5–5.1)
Sodium: 139 mmol/L (ref 135–145)

## 2021-10-26 LAB — MAGNESIUM: Magnesium: 1.8 mg/dL (ref 1.7–2.4)

## 2021-10-26 MED ORDER — POTASSIUM CHLORIDE 20 MEQ PO PACK
40.0000 meq | PACK | Freq: Once | ORAL | Status: AC
  Administered 2021-10-26: 40 meq via ORAL
  Filled 2021-10-26: qty 2

## 2021-10-26 MED ORDER — ENSURE ENLIVE PO LIQD
237.0000 mL | Freq: Three times a day (TID) | ORAL | Status: DC
  Administered 2021-10-26 – 2021-11-06 (×24): 237 mL via ORAL

## 2021-10-26 MED ORDER — POTASSIUM CHLORIDE 10 MEQ/100ML IV SOLN
10.0000 meq | INTRAVENOUS | Status: AC
  Administered 2021-10-26: 08:00:00 10 meq via INTRAVENOUS
  Filled 2021-10-26 (×2): qty 100

## 2021-10-26 MED ORDER — ALBUMIN HUMAN 25 % IV SOLN: 25.0000 g | Freq: Once | INTRAVENOUS | Status: DC

## 2021-10-26 MED ORDER — PROSOURCE PLUS PO LIQD
30.0000 mL | Freq: Three times a day (TID) | ORAL | Status: DC
  Administered 2021-10-26 – 2021-11-06 (×30): 30 mL via ORAL
  Filled 2021-10-26 (×34): qty 30

## 2021-10-26 MED ORDER — ACETAMINOPHEN 500 MG PO TABS
1000.0000 mg | ORAL_TABLET | Freq: Three times a day (TID) | ORAL | Status: DC
  Administered 2021-10-27 – 2021-10-30 (×9): 1000 mg via ORAL
  Filled 2021-10-26 (×11): qty 2

## 2021-10-26 MED ORDER — FUROSEMIDE 10 MG/ML IJ SOLN
4.0000 mg/h | INTRAVENOUS | Status: DC
  Administered 2021-10-26: 4 mg/h via INTRAVENOUS
  Filled 2021-10-26: qty 20

## 2021-10-26 MED ORDER — SODIUM CHLORIDE 0.9 % IV SOLN: INTRAVENOUS | Status: DC

## 2021-10-26 MED ORDER — POTASSIUM CHLORIDE 20 MEQ PO PACK
40.0000 meq | PACK | Freq: Once | ORAL | Status: AC
  Administered 2021-10-26: 14:00:00 40 meq via ORAL
  Filled 2021-10-26: qty 2

## 2021-10-26 MED ORDER — POTASSIUM CHLORIDE CRYS ER 20 MEQ PO TBCR
40.0000 meq | EXTENDED_RELEASE_TABLET | Freq: Once | ORAL | Status: AC
  Administered 2021-10-26: 07:00:00 40 meq via ORAL
  Filled 2021-10-26: qty 2

## 2021-10-26 MED ORDER — SODIUM CHLORIDE 0.9 % IV SOLN: INTRAVENOUS | Status: DC | PRN

## 2021-10-26 MED ORDER — ALBUMIN HUMAN 25 % IV SOLN
25.0000 g | Freq: Four times a day (QID) | INTRAVENOUS | Status: DC
  Administered 2021-10-26 (×2): 25 g via INTRAVENOUS
  Filled 2021-10-26 (×4): qty 100

## 2021-10-26 NOTE — Progress Notes (Addendum)
Nutrition Follow-up  DOCUMENTATION CODES:   Severe malnutrition in context of acute illness/injury, Morbid obesity  INTERVENTION:   -D/c Ensure Max -Continue Magic cup TID with meals, each supplement provides 290 kcal and 9 grams of protein  -Ensure Enlive po TID, each supplement provides 350 kcal and 20 grams of protein  -Continue MVI with minerals daily -Feeding assistance with meals -30 ml Prosource Plus TID, each supplement provides 100 kcals and 30 grams protein -If TF is pursued, recommend:  Initiate Osmolite 1.5 @ 25 ml/hr and increase by 10 ml every 12 hours to goal rate of 55 ml/hr.   90 ml Prosource TF BID.  170 ml free water flush every 4 hours    Tube feeding regimen provides 2140 kcal (100% of needs), 127 grams of protein, and 1006 ml of H2O. Total free water: 2026 ml daily  -If feedings are pursued, monitor Mg, K, and Phos ans replete as needed secondary to high refeeding risk  NUTRITION DIAGNOSIS:   Severe Malnutrition related to acute illness (COVID-19) as evidenced by severe fat depletion, severe muscle depletion, energy intake < or equal to 50% for > or equal to 1 month, edema.  Ongoing  GOAL:   Patient will meet greater than or equal to 90% of their needs  Unmet  MONITOR:   PO intake, Supplement acceptance, Diet advancement, Labs, Weight trends, Skin, I & O's  REASON FOR ASSESSMENT:   Consult Assessment of nutrition requirement/status  ASSESSMENT:   Kayla Long is a 75 y.o. female with medical history significant of hypotension on midodrine, severe lymphedema, bedbound status, diastolic CHF, hypertension presented to the ED on morning of 10/23/2021 with reports by family of very poor p.o. intake and worsening confusion for the past 3 days.  EMS was called to the home and found patient's blood pressure 87/40.  Patient had been unable to take her midodrine.  Family reported patient recently having cough, very poor appetite with little p.o. intake  but no apparent abdominal pain nausea or vomiting.  Family reported patient's mental status nowhere near baseline.  She is typically conversant, witty and quite interactive but has been more withdrawn.  Exposed to COVID-19  Reviewed I/O's: +513 ml x 24 hours and +1.9 L since admission  Spoke with pt and caregiver at bedside. Pt lethargic and would respond mostly to close ended questions. Most of history provided by pt's caregiver. She reports that pt has experienced a general decline in health over the past 3 weeks. Oral intake has become progressively worse over this time and pt complains of the sensation of food getting stuck in her throat; she has been refusing meats because of this. Observed meal tray- pt consumed a few sips of liquids and about 20% of Magic Cup. RD offered to assist pt with tray and multiple food items, but she replied "maybe later". She consumed a few sips of juice.   Pt unsure if she has lost weight. Discussed with pt importance of good meal and supplement intake to promote healing. She is amenable to supplements. Per caregiver, pt usually does better with eating when daughters come in and encourage her (family members have been rotating with visiting the patient during hospitalization). RD concerned over pt's ability to take in sufficient nutrition given her demonstrated poor oral intake and severe malnutrition.   Case discussed with MD and palliative care team. Discussed concern with pt's ability to take sufficient PO's, especially given her acute malnutrition and increased nutritional needs for acute illness  and would healing. If pt desire full scope care, PEG vs NGT may be needed to assist pt with meeting her nutritional goals. Palliative care consult pending for goals of care.   Medications reviewed and include remdesivir, vitamin C and zinc sulfate.   Labs reviewed: K: 2.6.    NUTRITION - FOCUSED PHYSICAL EXAM:  Flowsheet Row Most Recent Value  Orbital Region Severe  depletion  Upper Arm Region Mild depletion  Thoracic and Lumbar Region No depletion  Buccal Region Severe depletion  Temple Region Severe depletion  Clavicle Bone Region Severe depletion  Clavicle and Acromion Bone Region Severe depletion  Scapular Bone Region Severe depletion  Dorsal Hand Severe depletion  Patellar Region Unable to assess  Anterior Thigh Region Unable to assess  Posterior Calf Region Unable to assess  Edema (RD Assessment) Severe  Hair Reviewed  Eyes Reviewed  Mouth Reviewed  Skin Reviewed  Nails Reviewed       Diet Order:   Diet Order             DIET - DYS 1 Room service appropriate? Yes; Fluid consistency: Thin  Diet effective now                   EDUCATION NEEDS:   Education needs have been addressed  Skin:  Skin Assessment: Skin Integrity Issues: Skin Integrity Issues:: Other (Comment), Stage II Stage II: sacrum, lt/lt buttocks Other: MASD to buttocks, sacrum, inner upper thighs, posterior upper thighs; lt toe laceration  Last BM:  10/26/21  Height:   Ht Readings from Last 1 Encounters:  07/01/21 5\' 6"  (1.676 m)    Weight:   Wt Readings from Last 1 Encounters:  10/23/21 (!) 196 kg    Ideal Body Weight:  59.1 kg  BMI:  Body mass index is 69.74 kg/m.  Estimated Nutritional Needs:   Kcal:  2150-2350  Protein:  125-150 grams  Fluid:  > 2 L    Loistine Chance, RD, LDN, [place] Registered Dietitian II Certified Diabetes Care and Education Specialist Please refer to [facility] for RD and/or RD on-call/weekend/after hours pager

## 2021-10-26 NOTE — Progress Notes (Signed)
   Name Kayla Long MRN [id number] DOB 1946-08-25   Subjective: Nurse reports concerns with low pressures and severe weeping, skin friability on legs  Objective: Vitals:   10/25/21 2006 10/25/21 2231  BP: (!) 98/45 (!) 89/44  Pulse: 89 87  Resp: 20 18  Temp: 97.6 F (36.4 C) 98.6 F (37 C)  SpO2: 100% 100%     Assessment: Patient with severe hypoalbuminemia and low pressures. Low pressures preventing diuresis efforts effectiveness.  Pressors not ideal with no oncotic pressure  Plan: Patient started on albumin 25 gm every 6 hours with low dose lasix drip that can be titrated to effectiveness

## 2021-10-26 NOTE — Progress Notes (Signed)
PROGRESS NOTE    Kayla Long  QJJ:[id number] DOB: 10-15-1946 DOA: 10/23/2021 PCP: Pcp, No  122A/122A-AA   Assessment & Plan:   Principal Problem:   COVID-19 virus infection Active Problems:   Lymphedema of both lower extremities   IBS (irritable bowel syndrome)   Severe sepsis (HCC)   Acute metabolic encephalopathy   Anorexia   Acute on chronic anemia   Hypomagnesemia   Hypokalemia   Hypotension   Acute kidney injury superimposed on CKD (HCC)   CKD (chronic kidney disease), stage III (HCC)   Pressure injury of skin of buttock   Chronic diastolic CHF (congestive heart failure) (HCC)   Depression   Protein-calorie malnutrition, severe   75 y.o. female with medical history significant of hypotension on midodrine, severe lymphedema, bedbound status, diastolic CHF, hypertension presented to the ED on morning of 10/23/2021 with reports by family of very poor p.o. intake and worsening confusion for the past 3 days.  EMS was called to the home and found patient's blood pressure 87/40.  Patient had been unable to take her midodrine.  Family reported patient recently having cough, very poor appetite with little p.o. intake but no apparent abdominal pain nausea or vomiting.  Family reported patient's mental status nowhere near baseline.  She is typically conversant, witty and quite interactive but has been more withdrawn.  Exposed to COVID-19 from a family member about a week prior.  Family reported patient had more oozing from her decubitus wounds recently.   COVID-19 virus infection - POA with sick family contact about one week prior.  No hypoxia or respiratory symptoms. Plan: --cont Remdesivir --no steroids given lack of hypoxia   Severe sepsis - due to Covid-19. POA with tachycardia, leukocytosis, hypotension,  AKI and encephalopathy consistent with organ dysfunction.  Lactate normal. --treat underlying infection as outlined   Acute metabolic encephalopathy  multifactorial  due to Covid-19 infection, dehydration, worsened anemia, renal failure, severe malnutrition --alertness and mental status waxes and weans    Acute kidney injury superimposed on CKD stage IIIa - POA  --presented with Cr 1.48, baseline Cr around 1.1.  Given 1L of LR in the ED f/b MIVF.  MIVF d/c'ed on 12/7.  Cr did not improve with IVF.  AKI likely due to hypotension and 3rd spacing. Plan: --resume NS@50  for 20 hours due to daughter's insistence that stopping IVF made pt worse --will consult nephrology tomorrow    Acute on chronic anemia  - baseline Hbg 7.7 three months ago, presented with Hbg 6.5.  Family report increased blood from wounds recently, so likely acute on chronic blood loss. --s/p 2u pRBC Plan: --trend Hgb and transfuse to keep Hgb >7   Anorexia / Failure to thrive  Severe hypoalbuminemia  Dysphagia --albumin low around 1.5 since Aug 2022. --noted to have reduced oral intake and difficulty swallowing meat --SLP rec Dys 1 with thin fluid --supplements per dietician  --palliative consult  Chronic Lymphedema of both lower extremities - severe --fluid buildup for the past 2 years, exacerbated by low albumin.  BLE with hardened, cobblestone skin texture, and elephantiasis-appearance.  Skin weeping noted at dependent region. --takes oral lasix at home.  One attempt at IV diuresis with IV albumin support was not successful. Plan: --nephrology consult tomorrow  Hypokalemia / Hypomagnesemia   --monitor and replete PRN   Hypotension, chronic  --Low BP exacerbated by low albumin and fluid 3rd spacing. --stress dose steroid d/c'ed Plan: --cont midodrine --BP goal, systolic >48'J or MAP >85.  Chronic diastolic CHF - Echo 12/7033 in Care Everywhere EF >55%, grade II diastolic dysfunction.     IBS (irritable bowel syndrome)  - report as chronic issue. --home Senna-S PRN continued   Pressure injury of skin of buttock - Family reported recently worsening skin breakdown and  bleeding. WOC consulted. --wound care per order   Depression  - continue home Paxil   DVT prophylaxis: Lovenox SQ Code Status: Full code  Family Communication: daughter updated on the phone today Level of care: Med-Surg Dispo:   The patient is from: home Anticipated d/c is to: home Anticipated d/c date is: > 3 days Patient currently is not medically ready to d/c due to: AKI, hypotension   Subjective and Interval History:  Per RN, pt has extensive weeping from dependent region of her skin, with extensive skin breakdown.    Attempt to diurese didn't produce good urine output.    Pt's alertness, mental status and energy level appeared to wax and wean from hour to hour and day to day.  Daughter insisted that pt was doing a lot better last night, but not well this evening, and believed it was due to me stopping pt's MIVF.  Daughter wanted pt to receive IVF due to pt not having good oral intake.  Explained to daughter that due to pt's extremely low albumin, any IVF will just leak out and go straight to dependent region of pt's body, causing further skin weeping, therefore, it would be better to start tube feed instead of around-the-clock IVF.  Daughter is not sure about tube feed, therefore, insisted on IVF.     Objective: Vitals:   10/25/21 2231 10/26/21 0459 10/26/21 0758 10/26/21 1250  BP: (!) 89/44 (!) 99/45 (!) 90/46 (!) 86/42  Pulse: 87 94 99 95  Resp: 18 19 17 16   Temp: 98.6 F (37 C) (!) 97.5 F (36.4 C) (!) 97.4 F (36.3 C) 98 F (36.7 C)  TempSrc: Oral Oral Oral   SpO2: 100% 99% 100% 100%  Weight:        Intake/Output Summary (Last 24 hours) at 10/26/2021 1747 Last data filed at 10/26/2021 1335 Gross per 24 hour  Intake 684.98 ml  Output --  Net 684.98 ml   Filed Weights   10/23/21 1354  Weight: (!) 196 kg    Examination:   Constitutional: NAD, more alert, oriented.  Signs of temporal wasting.  Upper body skeletal, with massive lower body with just edema  buildup. HEENT: conjunctivae and lids normal, EOMI CV: No cyanosis.   RESP: normal respiratory effort, on RA Extremities: both thighs and lower legs elephantiasis-appearing with cobblestone skin texture Psych: depressed mood and affect.     Data Reviewed: I have personally reviewed following labs and imaging studies  CBC: Recent Labs  Lab 10/23/21 1000 10/24/21 0645 10/25/21 0612 10/26/21 0507  WBC 13.4* 11.5* 14.3* 16.3*  NEUTROABS 11.1* 9.1* 13.0*  --   HGB 6.5* 7.5* 6.8* 8.2*  HCT 20.1* 21.5* 19.7* 23.0*  MCV 86.3 83.0 84.5 83.0  PLT 220 178 163 009*   Basic Metabolic Panel: Recent Labs  Lab 10/23/21 0827 10/24/21 0509 10/25/21 0612 10/26/21 0507  NA 140 138 139 139  K 3.0* 2.7* 3.5 2.6*  CL 107 106 111 108  CO2 23 24 22 23   GLUCOSE 61* 67* 237* 132*  BUN 17 18 20 22   CREATININE 1.48* 1.54* 1.58* 1.70*  CALCIUM 7.1* 6.8* 6.7* 7.2*  MG 1.6* 1.6* 1.8 1.8  PHOS  --  2.1* 2.4*  --    GFR: Estimated Creatinine Clearance: 53 mL/min (A) (by C-G formula based on SCr of 1.7 mg/dL (H)). Liver Function Tests: Recent Labs  Lab 10/23/21 0827 10/24/21 0509 10/25/21 0612  AST 16 12* 9*  ALT 7 6 6   ALKPHOS 132* 109 97  BILITOT 0.7 1.0 0.8  PROT 6.0* 5.5* 5.4*  ALBUMIN <1.5* <1.5* 1.5*   No results for input(s): LIPASE, AMYLASE in the last 168 hours. No results for input(s): AMMONIA in the last 168 hours. Coagulation Profile: No results for input(s): INR, PROTIME in the last 168 hours. Cardiac Enzymes: No results for input(s): CKTOTAL, CKMB, CKMBINDEX, TROPONINI in the last 168 hours. BNP (last 3 results) No results for input(s): PROBNP in the last 8760 hours. HbA1C: No results for input(s): HGBA1C in the last 72 hours. CBG: No results for input(s): GLUCAP in the last 168 hours. Lipid Profile: No results for input(s): CHOL, HDL, LDLCALC, TRIG, CHOLHDL, LDLDIRECT in the last 72 hours. Thyroid Function Tests: No results for input(s): TSH, T4TOTAL, FREET4,  T3FREE, THYROIDAB in the last 72 hours. Anemia Panel: No results for input(s): VITAMINB12, FOLATE, FERRITIN, TIBC, IRON, RETICCTPCT in the last 72 hours.  Sepsis Labs: Recent Labs  Lab 10/23/21 1000  LATICACIDVEN 1.7    Recent Results (from the past 240 hour(s))  Culture, blood (routine x 2)     Status: None (Preliminary result)   Collection Time: 10/23/21  8:27 AM   Specimen: BLOOD  Result Value Ref Range Status   Specimen Description BLOOD LEFT HAND  Final   Special Requests   Final    BOTTLES DRAWN AEROBIC ONLY Blood Culture results may not be optimal due to an inadequate volume of blood received in culture bottles   Culture   Final    NO GROWTH 3 DAYS Performed at [facility], [address] [place], [place] [address]    Report Status PENDING  Incomplete  Resp Panel by RT-PCR (Flu A&B, Covid) Nasopharyngeal Swab     Status: Abnormal   Collection Time: 10/23/21 10:07 AM   Specimen: Nasopharyngeal Swab; Nasopharyngeal(NP) swabs in vial transport medium  Result Value Ref Range Status   SARS Coronavirus 2 by RT PCR POSITIVE (A) NEGATIVE Final    Comment: RESULT CALLED TO, READ BACK BY AND VERIFIED WITH: MAGGIE BARBRA 10/23/21 1140 (NOTE) SARS-CoV-2 target nucleic acids are DETECTED.  The SARS-CoV-2 RNA is generally detectable in upper respiratory specimens during the acute phase of infection. Positive results are indicative of the presence of the identified virus, but do not rule out bacterial infection or co-infection with other pathogens not detected by the test. Clinical correlation with patient history and other diagnostic information is necessary to determine patient infection status. The expected result is Negative.  Fact Sheet for Patients: EntrepreneurPulse.com.au  Fact Sheet for Healthcare Providers: IncredibleEmployment.be  This test is not yet approved or cleared by the [place] FDA and  has been authorized  for detection and/or diagnosis of SARS-CoV-2 by FDA under an Emergency Use Authorization (EUA).  This EUA will remain in effect (meaning this test can be used)  for the duration of  the COVID-19 declaration under Section 564(b)(1) of the Act, 21 U.S.C. section 360bbb-3(b)(1), unless the authorization is terminated or revoked sooner.     Influenza A by PCR NEGATIVE NEGATIVE Final   Influenza B by PCR NEGATIVE NEGATIVE Final    Comment: (NOTE) The Xpert Xpress SARS-CoV-2/FLU/RSV plus assay is intended as an aid in  the diagnosis of influenza from Nasopharyngeal swab specimens and should not be used as a sole basis for treatment. Nasal washings and aspirates are unacceptable for Xpert Xpress SARS-CoV-2/FLU/RSV testing.  Fact Sheet for Patients: EntrepreneurPulse.com.au  Fact Sheet for Healthcare Providers: IncredibleEmployment.be  This test is not yet approved or cleared by the [place] FDA and has been authorized for detection and/or diagnosis of SARS-CoV-2 by FDA under an Emergency Use Authorization (EUA). This EUA will remain in effect (meaning this test can be used) for the duration of the COVID-19 declaration under Section 564(b)(1) of the Act, 21 U.S.C. section 360bbb-3(b)(1), unless the authorization is terminated or revoked.  Performed at [facility], [address] [place], [place] [address]   Culture, blood (routine x 2)     Status: None (Preliminary result)   Collection Time: 10/23/21 11:21 AM   Specimen: BLOOD  Result Value Ref Range Status   Specimen Description BLOOD RIGHT ANTECUBITAL  Final   Special Requests   Final    BOTTLES DRAWN AEROBIC AND ANAEROBIC Blood Culture adequate volume   Culture   Final    NO GROWTH 3 DAYS Performed at [facility], [address] [place], [place] [address]    Report Status PENDING  Incomplete      Radiology Studies: No results found.   Scheduled Meds:   (feeding supplement) PROSource Plus  30 mL Oral TID BM   sodium chloride   Intravenous Once   vitamin C  500 mg Oral Daily   Chlorhexidine Gluconate Cloth  6 each Topical Daily   enoxaparin (LOVENOX) injection  0.5 mg/kg Subcutaneous Q24H   feeding supplement  237 mL Oral TID BM   Ipratropium-Albuterol  1 puff Inhalation Q6H   midodrine  10 mg Oral TID WC   multivitamin with minerals  1 tablet Oral Daily   pantoprazole  40 mg Oral QAC breakfast   PARoxetine  30 mg Oral Daily   sodium chloride flush  10-40 mL Intracatheter Q12H   zinc oxide   Topical TID   zinc sulfate  220 mg Oral Daily   Continuous Infusions:  sodium chloride 5 mL/hr at 10/26/21 1335   remdesivir 100 mg in NS 100 mL Stopped (10/26/21 1153)     LOS: 3 days     Enzo Bi, MD [facility] If 7PM-7AM, please contact night-coverage 10/26/2021, 5:47 PM

## 2021-10-26 NOTE — Progress Notes (Signed)
Palliative: Kayla Long is lying quietly in bed.  She appears acutely/chronically ill, morbidly obese, clearly malnourished.  She will briefly make but not keep eye contact.  She is able to tell me her name, but unable to answer further questions.  She is clearly unable to make any decisions about her care at this time.  There is no family at bedside at this time.  Call to daughter, Kayla Long. We talked about Kayla Long's acute health concerns including, but not limited to low protein and administration of albumin, low blood pressure, wounds and skin weeping, poor urine output and increasing creatinine.   We talked about CODE STATUS.  Kayla Long states that she has found the paperwork that her mother completed about advanced directives and will review this and bring it to the hospital.  At this point continue full scope/full code.  We talked about some what if's and maybe's.  Conference with attending, bedside nursing staff, transition of care team related to patient condition, needs, goals of care, disposition. PMT to meet with daughter at bedside tomorrow 10/9.  Plan:   At this point continue to treat the treatable, continue full scope/full code.  Time for outcomes.  PMT to meet with daughter at bedside tomorrow.  13 minutes  [name], NP Palliative medicine team Team phone [phone] Greater than 50% of this time was spent counseling and coordinating care related to the above assessment and plan.

## 2021-10-26 NOTE — Progress Notes (Signed)
Unable to apply dressing on posterior wounds. The wounds have undefined boarders. Patient has copious weeping. Dressing will not adhere to  skin without causing damage to surrounding healthy skin. Patient's skin is very vunerable to infection and shearing.

## 2021-10-27 DIAGNOSIS — N179 Acute kidney failure, unspecified: Secondary | ICD-10-CM | POA: Diagnosis not present

## 2021-10-27 DIAGNOSIS — Z515 Encounter for palliative care: Secondary | ICD-10-CM | POA: Diagnosis not present

## 2021-10-27 DIAGNOSIS — U071 COVID-19: Secondary | ICD-10-CM | POA: Diagnosis not present

## 2021-10-27 LAB — CBC
HCT: 21.3 % — ABNORMAL LOW (ref 36.0–46.0)
Hemoglobin: 7.4 g/dL — ABNORMAL LOW (ref 12.0–15.0)
MCH: 29.4 pg (ref 26.0–34.0)
MCHC: 34.7 g/dL (ref 30.0–36.0)
MCV: 84.5 fL (ref 80.0–100.0)
Platelets: 124 10*3/uL — ABNORMAL LOW (ref 150–400)
RBC: 2.52 MIL/uL — ABNORMAL LOW (ref 3.87–5.11)
RDW: 16.7 % — ABNORMAL HIGH (ref 11.5–15.5)
WBC: 17.9 10*3/uL — ABNORMAL HIGH (ref 4.0–10.5)
nRBC: 0.1 % (ref 0.0–0.2)

## 2021-10-27 LAB — BASIC METABOLIC PANEL
Anion gap: 9 (ref 5–15)
BUN: 29 mg/dL — ABNORMAL HIGH (ref 8–23)
CO2: 23 mmol/L (ref 22–32)
Calcium: 7.5 mg/dL — ABNORMAL LOW (ref 8.9–10.3)
Chloride: 110 mmol/L (ref 98–111)
Creatinine, Ser: 1.76 mg/dL — ABNORMAL HIGH (ref 0.44–1.00)
GFR, Estimated: 30 mL/min — ABNORMAL LOW (ref >60)
Glucose, Bld: 121 mg/dL — ABNORMAL HIGH (ref 70–99)
Potassium: 2.8 mmol/L — ABNORMAL LOW (ref 3.5–5.1)
Sodium: 142 mmol/L (ref 135–145)

## 2021-10-27 LAB — PROCALCITONIN: Procalcitonin: 41.57 ng/mL

## 2021-10-27 LAB — TSH: TSH: 9.469 u[IU]/mL — ABNORMAL HIGH (ref 0.350–4.500)

## 2021-10-27 LAB — MAGNESIUM: Magnesium: 1.8 mg/dL (ref 1.7–2.4)

## 2021-10-27 LAB — PHOSPHORUS: Phosphorus: 1.8 mg/dL — ABNORMAL LOW (ref 2.5–4.6)

## 2021-10-27 MED ORDER — POTASSIUM PHOSPHATES 15 MMOLE/5ML IV SOLN
30.0000 mmol | Freq: Once | INTRAVENOUS | Status: AC
  Administered 2021-10-27: 22:00:00 30 mmol via INTRAVENOUS
  Filled 2021-10-27 (×2): qty 10

## 2021-10-27 MED ORDER — POTASSIUM CHLORIDE IN NACL 20-0.9 MEQ/L-% IV SOLN
INTRAVENOUS | Status: DC
  Filled 2021-10-27 (×5): qty 1000

## 2021-10-27 MED ORDER — ALBUMIN HUMAN 25 % IV SOLN
25.0000 g | Freq: Three times a day (TID) | INTRAVENOUS | Status: AC
  Administered 2021-10-27 – 2021-10-28 (×3): 25 g via INTRAVENOUS
  Filled 2021-10-27 (×3): qty 100

## 2021-10-27 MED ORDER — POTASSIUM CHLORIDE 20 MEQ PO PACK
40.0000 meq | PACK | ORAL | Status: AC
  Administered 2021-10-27: 40 meq via ORAL
  Filled 2021-10-27: qty 2

## 2021-10-27 MED ORDER — POTASSIUM CHLORIDE 20 MEQ PO PACK: 40.0000 meq | PACK | ORAL | Status: DC

## 2021-10-27 NOTE — Progress Notes (Signed)
SLP Cancellation Note  Patient Details Name: Kayla Long MRN: [id number] DOB: [birth year]   Cancelled treatment:       Reason Eval/Treat Not Completed:  (chart reviewed; consulted MD, Palliative Care and Dietician re: pt's POC).  Pt remains acutely/chronically ill and very frail. Per MD note: "Family reported patient recently having cough, very poor appetite with little p.o. intake but no apparent abdominal pain nausea or vomiting.  Family reported patient's mental status nowhere near baseline.  She is typically conversant, witty and quite interactive but has been more withdrawn.  Exposed to COVID-19 from a family member about a week prior. Family reported patient had more oozing from her decubitus wounds recently.".  Pt is COVID+ w/ AKI and encephalopathy consistent with organ dysfunction. Per BSE this admit, pt exhibits inconsistent s/s of oropharyngeal phase dysphagia; a Baseline of reduced oral intake and difficulty swallowing meat -- suspect could be related to Esophageal phase dysmotility(?). This would warrant consult by GI for further assessment. Pt is Sedentary at home; Bedbound.   Pt is currently recommended a Dys 1(puree) diet thin liquids; aspiration precautions; support and Supervision at meals w/ Full Upright positioning for any oral intake; Pills given in Puree for safer swallowing. Per chart/MD/Dietician discussion today, alternative means of feeding (ie PEG) is warranted per Dietician, if full scope of practice and aggressive care per Family/pt is being requested d/t pt's poor oral intake and being unable to meet her nutrition/hydration needs via PO intake, especially in light of declined medical status, decubitus wounds.  In light of pt's current decline of status and need for further medical support including potential PEG placement for needs, ST services will sign off at this time. Recommend continue current diet w/ aspiration precautions and Pills in Puree for ease of intake;  Supervision during meals. ST services can be reconsulted when pt's medical status warrants consideration of assessment for diet upgrade in the future. This may be addressed at next venue of care as well. Recommend GI f/u for assessment of potential Esophageal dysmotility(pt's c/o "meats getting stuck").      Orinda Kenner, MS, CCC-SLP Speech Language Pathologist Rehab Services [phone] [facility] 10/27/2021, 1:52 PM

## 2021-10-27 NOTE — Progress Notes (Signed)
Nutrition Follow-up  DOCUMENTATION CODES:   Severe malnutrition in context of acute illness/injury, Morbid obesity  INTERVENTION:   -Continue Magic cup TID with meals, each supplement provides 290 kcal and 9 grams of protein  -Continue Ensure Enlive po TID, each supplement provides 350 kcal and 20 grams of protein  -Continue MVI with minerals daily -Continue feeding assistance with meals -Continue 30 ml Prosource Plus TID, each supplement provides 100 kcals and 30 grams protein  NUTRITION DIAGNOSIS:   Severe Malnutrition related to acute illness (COVID-19) as evidenced by severe fat depletion, severe muscle depletion, energy intake < or equal to 50% for > or equal to 1 month, edema.  Ongoing  GOAL:   Patient will meet greater than or equal to 90% of their needs  Unmet  MONITOR:   PO intake, Supplement acceptance, Diet advancement, Labs, Weight trends, Skin, I & O's  REASON FOR ASSESSMENT:   Consult Assessment of nutrition requirement/status  ASSESSMENT:   Kayla Long is a 75 y.o. female with medical history significant of hypotension on midodrine, severe lymphedema, bedbound status, diastolic CHF, hypertension presented to the ED on morning of 10/23/2021 with reports by family of very poor p.o. intake and worsening confusion for the past 3 days.  EMS was called to the home and found patient's blood pressure 87/40.  Patient had been unable to take her midodrine.  Family reported patient recently having cough, very poor appetite with little p.o. intake but no apparent abdominal pain nausea or vomiting.  Family reported patient's mental status nowhere near baseline.  She is typically conversant, witty and quite interactive but has been more withdrawn.  Exposed to COVID-19  Reviewed I/O's: +490 ml x 24 hours and +2.4 L since admission   Case discussed with RN, SLP, MD, and palliative care team.   Per RN, rectal tube and foley just placed today.   Pt continues to very  poor oral intake, however, does better when her daughter is present. Pt continues to have limited acceptance of nutritional supplements.   Per palliative care, pt family desires aggressive care at this time. MD shares that pt refused PEG placement. Given pt's poor oral intake and malnutrition, suspect continued nutritional decline without addition of enteral nutrition support. Per discussion with SLP, will continue current diet. Recommend most liberal diet that pt can tolerate to provide widest variety of food choices to promote oral intake.   Medications reviewed and include zinc sulfate and potassium chloride.   Labs reviewed: K: 2.8, Phos: 1.8 (on IV and PO supplementation).    Diet Order:   Diet Order             DIET - DYS 1 Room service appropriate? Yes; Fluid consistency: Thin  Diet effective now                   EDUCATION NEEDS:   Education needs have been addressed  Skin:  Skin Assessment: Skin Integrity Issues: Skin Integrity Issues:: Other (Comment), Stage II Stage II: sacrum, lt/lt buttocks Other: MASD to buttocks, sacrum, inner upper thighs, posterior upper thighs; lt toe laceration  Last BM:  10/27/21 (via rectal tube)  Height:   Ht Readings from Last 1 Encounters:  07/01/21 5\' 6"  (1.676 m)    Weight:   Wt Readings from Last 1 Encounters:  10/23/21 (!) 196 kg    Ideal Body Weight:  59.1 kg  BMI:  Body mass index is 69.74 kg/m.  Estimated Nutritional Needs:   Kcal:  2150-2350  Protein:  125-150 grams  Fluid:  > 2 L    Loistine Chance, RD, LDN, [place] Registered Dietitian II Certified Diabetes Care and Education Specialist Please refer to [facility] for RD and/or RD on-call/weekend/after hours pager

## 2021-10-27 NOTE — Progress Notes (Signed)
Palliative: Chart review completed.  Kayla Long is COVID-positive, in isolation.  She continues to be acutely/chronically ill and very frail.  Family has asked that palliative not see her unless family is present.  There is no family present at this time.  Call to daughter, Kayla Long.  We talked about nephrology consult, time for outcomes, continued albumin treatment.  Kayla Long denies questions or concerns at this time. We plan for palliative follow-up on Monday, 1212.  Plan: Continue full scope/full code.  Time for outcomes.  25 minutes [name], NP Palliative medicine team Team phone [phone]  Greater than 50% of this time was spent counseling and coordinating care related to the above assessment and plan.

## 2021-10-27 NOTE — Consult Note (Signed)
[facility]  CONSULT NOTE    Date: 10/27/2021                  Patient Name:  Kayla Long  MRN: [id number]  DOB: [birth year]  Age / Sex: 75 y.o., female         PCP: Pcp, No                 Service Requesting Consult: Dr.Lai                 Reason for Consult: Acute kidney injury            History of Present Illness: Kayla Long is admitted to [facility] on 10/23/2021 for altered mental status. Patient was found hypotensive and unable to take her medications. Found to have COVID-19. Initially placed on furosemide, IV albumin and potassium replacement.   Nephrology consulted for acute kidney injury. Daughter at bedside who assists with history taking. Patient still with some confusion and unable to answer many questions. Oriented to self and place. Not to situation or time.   Patient is eating better. Daughter states that patient is acting more herself and starting to improve with IV medications.    Medications: Outpatient medications: Medications Prior to Admission  Medication Sig Dispense Refill Last Dose   furosemide (LASIX) 40 MG tablet Take 80 mg by mouth daily.     Past Week at Unknown   midodrine (PROAMATINE) 10 MG tablet Take 1 tablet (10 mg total) by mouth 3 (three) times daily as needed (for SBP <100). 90 tablet 0 Unknown at PRN   Oxycodone HCl 10 MG TABS Take 10 mg by mouth 3 (three) times daily as needed (severe pain).   Unknown at PRN   pantoprazole (PROTONIX) 40 MG tablet Take 1 tablet (40 mg total) by mouth daily before breakfast. 30 tablet 0 Past Week at Unknown   PARoxetine (PAXIL) 30 MG tablet Take 30 mg by mouth daily.   Past Week at Unknown   senna-docusate (SENOKOT-S) 8.6-50 MG tablet Take 1 tablet by mouth at bedtime as needed for moderate constipation. 30 tablet 0 Unknown at PRN   SSD 1 % cream Apply 1 application topically daily. (Apply to cracks and affected areas of skin)      folic acid (FOLVITE) 1 MG tablet Take 1 tablet  (1 mg total) by mouth daily. (Patient not taking: Reported on 10/23/2021) 30 tablet 0 Not Taking   iron polysaccharides (NIFEREX) 150 MG capsule Take 1 capsule (150 mg total) by mouth daily. (Patient not taking: Reported on 10/23/2021) 30 capsule 0 Not Taking    Current medications: Current Facility-Administered Medications  Medication Dose Route Frequency Provider Last Rate Last Admin   (feeding supplement) PROSource Plus liquid 30 mL  30 mL Oral TID BM Enzo Bi, MD   30 mL at 10/26/21 2000   0.9 %  sodium chloride infusion (Manually program via Guardrails IV Fluids)   Intravenous Once Enzo Bi, MD       0.9 %  sodium chloride infusion   Intravenous PRN Enzo Bi, MD 5 mL/hr at 10/26/21 1335 Infusion Verify at 10/26/21 1335   0.9 % NaCl with KCl 20 mEq/ L  infusion   Intravenous Continuous Shadrick Senne, MD       acetaminophen (TYLENOL) tablet 1,000 mg  1,000 mg Oral TID Enzo Bi, MD       albumin human 25 % solution 25 g  25 g Intravenous  Q8H Laurens Matheny, Lurena Nida, MD       ascorbic acid (VITAMIN C) tablet 500 mg  500 mg Oral Daily Nicole Kindred A, DO   500 mg at 10/26/21 0813   bisacodyl (DULCOLAX) suppository 10 mg  10 mg Rectal Daily PRN Nicole Kindred A, DO       Chlorhexidine Gluconate Cloth 2 % PADS 6 each  6 each Topical Daily Nicole Kindred A, DO   6 each at 10/26/21 1618   enoxaparin (LOVENOX) injection 97.5 mg  0.5 mg/kg Subcutaneous Q24H Nicole Kindred A, DO   97.5 mg at 10/24/21 2158   feeding supplement (ENSURE ENLIVE / ENSURE PLUS) liquid 237 mL  237 mL Oral TID BM Enzo Bi, MD   237 mL at 10/26/21 1859   guaiFENesin-dextromethorphan (ROBITUSSIN DM) 100-10 MG/5ML syrup 10 mL  10 mL Oral Q4H PRN Nicole Kindred A, DO       Ipratropium-Albuterol (COMBIVENT) respimat 1 puff  1 puff Inhalation Q6H Nicole Kindred A, DO   1 puff at 10/26/21 1349   midodrine (PROAMATINE) tablet 10 mg  10 mg Oral TID WC Ralene Muskrat B, MD   10 mg at 10/26/21 1857   multivitamin with minerals  tablet 1 tablet  1 tablet Oral Daily Nicole Kindred A, DO   1 tablet at 10/26/21 0813   ondansetron (ZOFRAN) tablet 4 mg  4 mg Oral Q6H PRN Nicole Kindred A, DO       Or   ondansetron (ZOFRAN) injection 4 mg  4 mg Intravenous Q6H PRN Nicole Kindred A, DO       pantoprazole (PROTONIX) EC tablet 40 mg  40 mg Oral QAC breakfast Nicole Kindred A, DO   40 mg at 10/26/21 1115   PARoxetine (PAXIL) tablet 30 mg  30 mg Oral Daily Nicole Kindred A, DO   30 mg at 10/26/21 5462   remdesivir 100 mg in sodium chloride 0.9 % 100 mL IVPB  100 mg Intravenous Daily Nicole Kindred A, DO   Stopped at 10/26/21 1153   senna-docusate (Senokot-S) tablet 1 tablet  1 tablet Oral QHS PRN Nicole Kindred A, DO       sodium chloride flush (NS) 0.9 % injection 10-40 mL  10-40 mL Intracatheter Q12H Nicole Kindred A, DO   10 mL at 10/26/21 2159   sodium chloride flush (NS) 0.9 % injection 10-40 mL  10-40 mL Intracatheter PRN Nicole Kindred A, DO       zinc oxide 20 % ointment   Topical TID Sidney Ace, MD   Given at 10/26/21 2159   zinc sulfate capsule 220 mg  220 mg Oral Daily Nicole Kindred A, DO   220 mg at 10/26/21 0813      Allergies: Allergies  Allergen Reactions   Fluoxetine Hcl     REACTION: headache      Past Medical History: Past Medical History:  Diagnosis Date   Hx of colonic polyps    tubulovillous adenoma   Hyperlipidemia    IBS (irritable bowel syndrome)    Spinal stenosis    with secondary neuropathy     Past Surgical History: Past Surgical History:  Procedure Laterality Date   PARTIAL HYSTERECTOMY     vaginal deliveries     x2   VESICOVAGINAL FISTULA CLOSURE W/ TAH       Family History: Family History  Problem Relation Age of Onset   Stroke Mother    Stroke Father    Heart disease Brother  Social History: Social History   Socioeconomic History   Marital status: Divorced    Spouse name: Not on file   Number of children: Not on file   Years of  education: Not on file   Highest education level: Not on file  Occupational History   Not on file  Tobacco Use   Smoking status: Former    Types: Cigarettes   Smokeless tobacco: Never   Tobacco comments:    off and on?  Substance and Sexual Activity   Alcohol use: Yes    Comment: occasional    Drug use: Never   Sexual activity: Not Currently  Other Topics Concern   Not on file  Social History Narrative   Divorced,  2 children.    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   Daughter-Fances Modeste [phone]   Social Determinants of Health   Financial Resource Strain: Not on file  Food Insecurity: Not on file  Transportation Needs: Not on file  Physical Activity: Not on file  Stress: Not on file  Social Connections: Not on file  Intimate Partner Violence: Not on file     Review of Systems: Review of Systems  Constitutional: Negative.   HENT: Negative.    Eyes: Negative.   Respiratory:  Negative for cough, hemoptysis, sputum production, shortness of breath and wheezing.   Cardiovascular:  Positive for leg swelling. Negative for chest pain, palpitations, orthopnea, claudication and PND.  Gastrointestinal: Negative.   Genitourinary:  Negative for dysuria, flank pain, frequency, hematuria and urgency.  Musculoskeletal:  Negative for back pain, falls, joint pain, myalgias and neck pain.  Skin: Negative.   Neurological: Negative.   Endo/Heme/Allergies: Negative.   Psychiatric/Behavioral: Negative.     Vital Signs: Blood pressure (!) 94/54, pulse 95, temperature 98.2 F (36.8 C), temperature source Oral, resp. rate 17, weight (!) 196 kg, SpO2 100 %.  Weight trends: Filed Weights   10/23/21 1354  Weight: (!) 196 kg    Physical Exam: General: NAD, sitting up in bed  Head: +temporal wasting  Eyes: Anicteric, PERRL  Neck: Supple, trachea midline  Lungs:  Clear to auscultation  Heart: Regular rate and rhythm  Abdomen:   Soft, nontender, obese  Extremities:  ++ lymphedema   Neurologic: Alert to self and place only  Skin: No lesions        Lab results: Basic Metabolic Panel: Recent Labs  Lab 10/23/21 0827 10/24/21 0509 10/25/21 0612 10/26/21 0507 10/27/21 0632  NA 140 138 139 139 142  K 3.0* 2.7* 3.5 2.6* 2.8*  CL 107 106 111 108 110  CO2 23 24 22 23 23   GLUCOSE 61* 67* 237* 132* 121*  BUN 17 18 20 22  29*  CREATININE 1.48* 1.54* 1.58* 1.70* 1.76*  CALCIUM 7.1* 6.8* 6.7* 7.2* 7.5*  MG 1.6* 1.6* 1.8 1.8 1.8  PHOS  --  2.1* 2.4*  --  1.8*    Liver Function Tests: Recent Labs  Lab 10/23/21 0827 10/24/21 0509 10/25/21 0612  AST 16 12* 9*  ALT 7 6 6   ALKPHOS 132* 109 97  BILITOT 0.7 1.0 0.8  PROT 6.0* 5.5* 5.4*  ALBUMIN <1.5* <1.5* 1.5*   No results for input(s): LIPASE, AMYLASE in the last 168 hours. No results for input(s): AMMONIA in the last 168 hours.  CBC: Recent Labs  Lab 10/23/21 1000 10/24/21 0645 10/25/21 0612 10/26/21 0507 10/27/21 0632  WBC 13.4* 11.5* 14.3* 16.3* 17.9*  NEUTROABS 11.1* 9.1* 13.0*  --   --  HGB 6.5* 7.5* 6.8* 8.2* 7.4*  HCT 20.1* 21.5* 19.7* 23.0* 21.3*  MCV 86.3 83.0 84.5 83.0 84.5  PLT 220 178 163 147* 124*    Cardiac Enzymes: No results for input(s): CKTOTAL, CKMB, CKMBINDEX, TROPONINI in the last 168 hours.  BNP: Invalid input(s): POCBNP  CBG: No results for input(s): GLUCAP in the last 168 hours.  Microbiology: Results for orders placed or performed during the hospital encounter of 10/23/21  Culture, blood (routine x 2)     Status: None (Preliminary result)   Collection Time: 10/23/21  8:27 AM   Specimen: BLOOD  Result Value Ref Range Status   Specimen Description BLOOD LEFT HAND  Final   Special Requests   Final    BOTTLES DRAWN AEROBIC ONLY Blood Culture results may not be optimal due to an inadequate volume of blood received in culture bottles   Culture   Final    NO GROWTH 4 DAYS Performed at [facility], [address] [place], [place] [address]    Report Status PENDING  Incomplete  Resp Panel by RT-PCR (Flu A&B, Covid) Nasopharyngeal Swab     Status: Abnormal   Collection Time: 10/23/21 10:07 AM   Specimen: Nasopharyngeal Swab; Nasopharyngeal(NP) swabs in vial transport medium  Result Value Ref Range Status   SARS Coronavirus 2 by RT PCR POSITIVE (A) NEGATIVE Final    Comment: RESULT CALLED TO, READ BACK BY AND VERIFIED WITH: MAGGIE BARBRA 10/23/21 1140 (NOTE) SARS-CoV-2 target nucleic acids are DETECTED.  The SARS-CoV-2 RNA is generally detectable in upper respiratory specimens during the acute phase of infection. Positive results are indicative of the presence of the identified virus, but do not rule out bacterial infection or co-infection with other pathogens not detected by the test. Clinical correlation with patient history and other diagnostic information is necessary to determine patient infection status. The expected result is Negative.  Fact Sheet for Patients: EntrepreneurPulse.com.au  Fact Sheet for Healthcare Providers: IncredibleEmployment.be  This test is not yet approved or cleared by the [place] FDA and  has been authorized for detection and/or diagnosis of SARS-CoV-2 by FDA under an Emergency Use Authorization (EUA).  This EUA will remain in effect (meaning this test can be used)  for the duration of  the COVID-19 declaration under Section 564(b)(1) of the Act, 21 U.S.C. section 360bbb-3(b)(1), unless the authorization is terminated or revoked sooner.     Influenza A by PCR NEGATIVE NEGATIVE Final   Influenza B by PCR NEGATIVE NEGATIVE Final    Comment: (NOTE) The Xpert Xpress SARS-CoV-2/FLU/RSV plus assay is intended as an aid in the diagnosis of influenza from Nasopharyngeal swab specimens and should not be used as a sole basis for treatment. Nasal washings and aspirates are unacceptable for Xpert Xpress  SARS-CoV-2/FLU/RSV testing.  Fact Sheet for Patients: EntrepreneurPulse.com.au  Fact Sheet for Healthcare Providers: IncredibleEmployment.be  This test is not yet approved or cleared by the [place] FDA and has been authorized for detection and/or diagnosis of SARS-CoV-2 by FDA under an Emergency Use Authorization (EUA). This EUA will remain in effect (meaning this test can be used) for the duration of the COVID-19 declaration under Section 564(b)(1) of the Act, 21 U.S.C. section 360bbb-3(b)(1), unless the authorization is terminated or revoked.  Performed at [facility], [address] [place], [place] [address]   Culture, blood (routine x 2)     Status: None (Preliminary result)   Collection Time: 10/23/21 11:21 AM   Specimen: BLOOD  Result Value Ref Range Status   Specimen Description BLOOD RIGHT ANTECUBITAL  Final   Special Requests   Final    BOTTLES DRAWN AEROBIC AND ANAEROBIC Blood Culture adequate volume   Culture   Final    NO GROWTH 4 DAYS Performed at [address] [place], [place] [address]    Report Status PENDING  Incomplete    Coagulation Studies: No results for input(s): LABPROT, INR in the last 72 hours.  Urinalysis: No results for input(s): COLORURINE, LABSPEC, PHURINE, GLUCOSEU, HGBUR, BILIRUBINUR, KETONESUR, PROTEINUR, UROBILINOGEN, NITRITE, LEUKOCYTESUR in the last 72 hours.  Invalid input(s): APPERANCEUR    Imaging: No results found.   Assessment & Plan: Kayla Long is a 75 y.o. black female with chronic lymphedema, hyperlipidemia, hypotension, GERD, depression, anemia, history of DVT who has been immobile for over 2 years and now admitted to [facility] on 10/23/2021 for Hypokalemia [E87.6] Hypomagnesemia [E83.42] Anemia, unspecified type [D64.9] COVID-19 virus infection [U07.1] COVID-19 [U07.1]  Acute kidney injury on chronic kidney disease stage IIIA: baseline  creatinine of 1.13, GFR of 51 on 07/11/21. With hematuria. No IV contrast exposure. Renal ultrasound from 8/17 reviewed. Patient appear intravascularly depleted - Continue IV albumin for one more day - Continue IV normal saline infusion  Hypotension: volume depletion and malnutrition.  - Continue IV fluids as above - Continue IV albumin as above - Continue midodrine  Hypokalemia: with malnutrition. Hypokalemic on admission.  - replace potassium  Chronic lympedema: low BNP. No evidence of volume overload. Holding diuretics.      LOS: 4 Yaacov Koziol 12/9/202210:25 AM

## 2021-10-27 NOTE — Progress Notes (Signed)
Patient and family decided that they wanted to proceed with foley catheter due to patient's wounds and intolerance to amount of turning to be cleaned up when incontinent.  This RN along with 4 other staff members were unable to visualize or get patient into a good position that she would tolerate in order to perform insertion. Patient in a lot of discomfort already after having a full bath/linen change, so requested we try at another time. Will pass along to day shift RN.

## 2021-10-27 NOTE — Progress Notes (Signed)
PROGRESS NOTE    Kayla Long  XBJ:[id number] DOB: [birth year] DOA: 10/23/2021 PCP: Pcp, No  122A/122A-AA   Assessment & Plan:   Principal Problem:   COVID-19 virus infection Active Problems:   Lymphedema of both lower extremities   IBS (irritable bowel syndrome)   Severe sepsis (HCC)   Acute metabolic encephalopathy   Anorexia   Acute on chronic anemia   Hypomagnesemia   Hypokalemia   Hypotension   Acute kidney injury superimposed on CKD (HCC)   CKD (chronic kidney disease), stage III (HCC)   Pressure injury of skin of buttock   Chronic diastolic CHF (congestive heart failure) (HCC)   Depression   Protein-calorie malnutrition, severe   75 y.o. female with medical history significant of hypotension on midodrine, severe lymphedema, bedbound status, diastolic CHF, hypertension presented to the ED on morning of 10/23/2021 with reports by family of very poor p.o. intake and worsening confusion for the past 3 days.  EMS was called to the home and found patient's blood pressure 87/40.  Patient had been unable to take her midodrine.  Family reported patient recently having cough, very poor appetite with little p.o. intake but no apparent abdominal pain nausea or vomiting.  Family reported patient's mental status nowhere near baseline.  She is typically conversant, witty and quite interactive but has been more withdrawn.  Exposed to COVID-19 from a family member about a week prior.  Family reported patient had more oozing from her decubitus wounds recently.   COVID-19 virus infection - POA with sick family contact about one week prior.  No hypoxia or respiratory symptoms. Plan: --cont Remdesivir --hold steroids given lack of hypoxia   Severe sepsis - due to Covid-19. POA with tachycardia, leukocytosis, hypotension,  AKI and encephalopathy consistent with organ dysfunction.  Lactate normal. --treat underlying infection as outlined   Acute metabolic encephalopathy  multifactorial  due to Covid-19 infection, dehydration, worsened anemia, renal failure, severe malnutrition --alertness and mental status waxes and weans    Acute kidney injury superimposed on CKD stage IIIa - POA  --presented with Cr 1.48, baseline Cr around 1.1.  Given 1L of LR in the ED f/b MIVF.  MIVF d/c'ed on 12/7.  Cr did not improve with IVF.  AKI likely due to hypotension and intravascular depletion. Plan: --nephrology consult today --cont MIVF@50  with IV albumin, per nephro   Acute on chronic anemia  - baseline Hbg 7.7 three months ago, presented with Hbg 6.5.  Family report increased blood from wounds recently, so likely acute on chronic blood loss. --s/p 2u pRBC Plan: --trend Hgb and transfuse to keep Hgb >7   Anorexia / Failure to thrive  Severe hypoalbuminemia  Dysphagia --albumin low around 1.5 since Aug 2022. --noted to have reduced oral intake and difficulty swallowing meat --pt refused tube feed Plan: --SLP rec Dys 1 with thin fluid --supplements per dietician  --palliative consult following  Chronic Lymphedema of both lower extremities - severe --fluid buildup for the past 2 years, exacerbated by low albumin.  BLE with hardened, cobblestone skin texture, and elephantiasis-appearance.  Skin weeping noted at dependent region. --takes oral lasix at home.  One attempt at IV diuresis with IV albumin support was not successful. Plan: --nephrology consult today for fluid management  Hypokalemia / Hypomagnesemia   Hypophos --monitor and replete PRN   Hypotension, chronic  --Low BP exacerbated by low albumin and fluid 3rd spacing. --stress dose steroid d/c'ed Plan: --cont midodrine --BP goal, systolic >30'Q or MAP >65. --cont MIVF@50   with IV albumin, per nephro   Chronic diastolic CHF - Echo 12/6832 in Care Everywhere EF >55%, grade II diastolic dysfunction.     IBS (irritable bowel syndrome)  - report as chronic issue. --home Senna-S PRN continued   Pressure injury of skin  of buttock  Extensive skin breakdown Family reported recently worsening skin breakdown and bleeding, from exposure to stool and urine.  It takes 5 people to turn pt for cleaning, and also painful for pt. --WOC consulted. Plan: --wound care per order --insert Foley and rectal tube today   Depression  - continue home Paxil   DVT prophylaxis: Lovenox SQ Code Status: Full code  Family Communication: daughter updated at the bedside today Level of care: Med-Surg Dispo:   The patient is from: home Anticipated d/c is to: home Anticipated d/c date is: > 3 days Patient currently is not medically ready to d/c due to: AKI, hypotension   Subjective and Interval History:  Pt reported feeling better today.  Feeding supplements charted as refused for this morning, but daughter said pt did take them.  Pt refused tube feed.  Foley and rectal tube placed due to extensive skin breakdown.  Nephrology consulted today to help management pt's fluid status.    Objective: Vitals:   10/26/21 1250 10/27/21 0500 10/27/21 1224 10/27/21 1623  BP: (!) 86/42 (!) 94/54 (!) 81/40 (!) 95/41  Pulse: 95 95 99 98  Resp: 16 17 20 20   Temp: 98 F (36.7 C) 98.2 F (36.8 C) (!) 97.5 F (36.4 C) 97.7 F (36.5 C)  TempSrc:  Oral Oral Oral  SpO2: 100% 100% 100% 100%  Weight:       No intake or output data in the 24 hours ending 10/27/21 1714  Filed Weights   10/23/21 1354  Weight: (!) 196 kg    Examination:   Constitutional: NAD, AAOx3, signs of temporal wasting.  Upper body skeletal, but massive lower body with edema buildup HEENT: conjunctivae and lids normal, EOMI CV: No cyanosis.   RESP: normal respiratory effort, on RA Extremities: both thighs and lower legs elephantiasis-appearing with cobblestone skin texture Psych: depressed mood and affect.     Data Reviewed: I have personally reviewed following labs and imaging studies  CBC: Recent Labs  Lab 10/23/21 1000 10/24/21 0645 10/25/21 0612  10/26/21 0507 10/27/21 0632  WBC 13.4* 11.5* 14.3* 16.3* 17.9*  NEUTROABS 11.1* 9.1* 13.0*  --   --   HGB 6.5* 7.5* 6.8* 8.2* 7.4*  HCT 20.1* 21.5* 19.7* 23.0* 21.3*  MCV 86.3 83.0 84.5 83.0 84.5  PLT 220 178 163 147* 196*   Basic Metabolic Panel: Recent Labs  Lab 10/23/21 0827 10/24/21 0509 10/25/21 0612 10/26/21 0507 10/27/21 0632  NA 140 138 139 139 142  K 3.0* 2.7* 3.5 2.6* 2.8*  CL 107 106 111 108 110  CO2 23 24 22 23 23   GLUCOSE 61* 67* 237* 132* 121*  BUN 17 18 20 22  29*  CREATININE 1.48* 1.54* 1.58* 1.70* 1.76*  CALCIUM 7.1* 6.8* 6.7* 7.2* 7.5*  MG 1.6* 1.6* 1.8 1.8 1.8  PHOS  --  2.1* 2.4*  --  1.8*   GFR: Estimated Creatinine Clearance: 51.2 mL/min (A) (by C-G formula based on SCr of 1.76 mg/dL (H)). Liver Function Tests: Recent Labs  Lab 10/23/21 0827 10/24/21 0509 10/25/21 0612  AST 16 12* 9*  ALT 7 6 6   ALKPHOS 132* 109 97  BILITOT 0.7 1.0 0.8  PROT 6.0* 5.5* 5.4*  ALBUMIN <1.5* <  1.5* 1.5*   No results for input(s): LIPASE, AMYLASE in the last 168 hours. No results for input(s): AMMONIA in the last 168 hours. Coagulation Profile: No results for input(s): INR, PROTIME in the last 168 hours. Cardiac Enzymes: No results for input(s): CKTOTAL, CKMB, CKMBINDEX, TROPONINI in the last 168 hours. BNP (last 3 results) No results for input(s): PROBNP in the last 8760 hours. HbA1C: No results for input(s): HGBA1C in the last 72 hours. CBG: No results for input(s): GLUCAP in the last 168 hours. Lipid Profile: No results for input(s): CHOL, HDL, LDLCALC, TRIG, CHOLHDL, LDLDIRECT in the last 72 hours. Thyroid Function Tests: Recent Labs    10/27/21 0632  TSH 9.469*   Anemia Panel: No results for input(s): VITAMINB12, FOLATE, FERRITIN, TIBC, IRON, RETICCTPCT in the last 72 hours.  Sepsis Labs: Recent Labs  Lab 10/23/21 1000 10/27/21 9211  PROCALCITON  --  41.57  LATICACIDVEN 1.7  --     Recent Results (from the past 240 hour(s))  Culture,  blood (routine x 2)     Status: None (Preliminary result)   Collection Time: 10/23/21  8:27 AM   Specimen: BLOOD  Result Value Ref Range Status   Specimen Description BLOOD LEFT HAND  Final   Special Requests   Final    BOTTLES DRAWN AEROBIC ONLY Blood Culture results may not be optimal due to an inadequate volume of blood received in culture bottles   Culture   Final    NO GROWTH 4 DAYS Performed at [facility], [address], [place], [place] [address]    Report Status PENDING  Incomplete  Resp Panel by RT-PCR (Flu A&B, Covid) Nasopharyngeal Swab     Status: Abnormal   Collection Time: 10/23/21 10:07 AM   Specimen: Nasopharyngeal Swab; Nasopharyngeal(NP) swabs in vial transport medium  Result Value Ref Range Status   SARS Coronavirus 2 by RT PCR POSITIVE (A) NEGATIVE Final    Comment: RESULT CALLED TO, READ BACK BY AND VERIFIED WITH: MAGGIE BARBRA 10/23/21 1140 (NOTE) SARS-CoV-2 target nucleic acids are DETECTED.  The SARS-CoV-2 RNA is generally detectable in upper respiratory specimens during the acute phase of infection. Positive results are indicative of the presence of the identified virus, but do not rule out bacterial infection or co-infection with other pathogens not detected by the test. Clinical correlation with patient history and other diagnostic information is necessary to determine patient infection status. The expected result is Negative.  Fact Sheet for Patients: EntrepreneurPulse.com.au  Fact Sheet for Healthcare Providers: IncredibleEmployment.be  This test is not yet approved or cleared by the [place] FDA and  has been authorized for detection and/or diagnosis of SARS-CoV-2 by FDA under an Emergency Use Authorization (EUA).  This EUA will remain in effect (meaning this test can be used)  for the duration of  the COVID-19 declaration under Section 564(b)(1) of the Act, 21 U.S.C. section 360bbb-3(b)(1),  unless the authorization is terminated or revoked sooner.     Influenza A by PCR NEGATIVE NEGATIVE Final   Influenza B by PCR NEGATIVE NEGATIVE Final    Comment: (NOTE) The Xpert Xpress SARS-CoV-2/FLU/RSV plus assay is intended as an aid in the diagnosis of influenza from Nasopharyngeal swab specimens and should not be used as a sole basis for treatment. Nasal washings and aspirates are unacceptable for Xpert Xpress SARS-CoV-2/FLU/RSV testing.  Fact Sheet for Patients: EntrepreneurPulse.com.au  Fact Sheet for Healthcare Providers: IncredibleEmployment.be  This test is not yet approved or cleared by the [place]  FDA and has been authorized for detection and/or diagnosis of SARS-CoV-2 by FDA under an Emergency Use Authorization (EUA). This EUA will remain in effect (meaning this test can be used) for the duration of the COVID-19 declaration under Section 564(b)(1) of the Act, 21 U.S.C. section 360bbb-3(b)(1), unless the authorization is terminated or revoked.  Performed at [facility], [address] [place], [place] [address]   Culture, blood (routine x 2)     Status: None (Preliminary result)   Collection Time: 10/23/21 11:21 AM   Specimen: BLOOD  Result Value Ref Range Status   Specimen Description BLOOD RIGHT ANTECUBITAL  Final   Special Requests   Final    BOTTLES DRAWN AEROBIC AND ANAEROBIC Blood Culture adequate volume   Culture   Final    NO GROWTH 4 DAYS Performed at [facility], [address], [place], [place] [address]    Report Status PENDING  Incomplete      Radiology Studies: No results found.   Scheduled Meds:  (feeding supplement) PROSource Plus  30 mL Oral TID BM   sodium chloride   Intravenous Once   acetaminophen  1,000 mg Oral TID   vitamin C  500 mg Oral Daily   Chlorhexidine Gluconate Cloth  6 each Topical Daily   enoxaparin (LOVENOX) injection  0.5 mg/kg Subcutaneous Q24H    feeding supplement  237 mL Oral TID BM   Ipratropium-Albuterol  1 puff Inhalation Q6H   midodrine  10 mg Oral TID WC   multivitamin with minerals  1 tablet Oral Daily   pantoprazole  40 mg Oral QAC breakfast   PARoxetine  30 mg Oral Daily   potassium chloride  40 mEq Oral Q4H   sodium chloride flush  10-40 mL Intracatheter Q12H   zinc oxide   Topical TID   zinc sulfate  220 mg Oral Daily   Continuous Infusions:  sodium chloride 5 mL/hr at 10/26/21 1335   0.9 % NaCl with KCl 20 mEq / L 50 mL/hr at 10/27/21 1216   albumin human 25 g (10/27/21 1558)   potassium PHOSPHATE IVPB (in mmol)       LOS: 4 days     Enzo Bi, MD [facility] If 7PM-7AM, please contact night-coverage 10/27/2021, 5:14 PM

## 2021-10-27 NOTE — Progress Notes (Signed)
Cross Cover TSH added to labs this AM secondary to failure to thrive patient picture with anorexia and severe hypoalbunemia complicating her BLL lymphedema.  TSH is above 9  (9.469). A free T4 has been added to am labs

## 2021-10-28 DIAGNOSIS — U071 COVID-19: Secondary | ICD-10-CM | POA: Diagnosis not present

## 2021-10-28 LAB — CBC
HCT: 18 % — ABNORMAL LOW (ref 36.0–46.0)
Hemoglobin: 6.3 g/dL — ABNORMAL LOW (ref 12.0–15.0)
MCH: 29.3 pg (ref 26.0–34.0)
MCHC: 35 g/dL (ref 30.0–36.0)
MCV: 83.7 fL (ref 80.0–100.0)
Platelets: 122 10*3/uL — ABNORMAL LOW (ref 150–400)
RBC: 2.15 MIL/uL — ABNORMAL LOW (ref 3.87–5.11)
RDW: 17.1 % — ABNORMAL HIGH (ref 11.5–15.5)
WBC: 15.6 10*3/uL — ABNORMAL HIGH (ref 4.0–10.5)
nRBC: 0 % (ref 0.0–0.2)

## 2021-10-28 LAB — CULTURE, BLOOD (ROUTINE X 2)
Culture: NO GROWTH
Culture: NO GROWTH
Special Requests: ADEQUATE

## 2021-10-28 LAB — MAGNESIUM: Magnesium: 1.7 mg/dL (ref 1.7–2.4)

## 2021-10-28 LAB — BASIC METABOLIC PANEL
Anion gap: 6 (ref 5–15)
BUN: 31 mg/dL — ABNORMAL HIGH (ref 8–23)
CO2: 24 mmol/L (ref 22–32)
Calcium: 7.4 mg/dL — ABNORMAL LOW (ref 8.9–10.3)
Chloride: 113 mmol/L — ABNORMAL HIGH (ref 98–111)
Creatinine, Ser: 1.78 mg/dL — ABNORMAL HIGH (ref 0.44–1.00)
GFR, Estimated: 30 mL/min — ABNORMAL LOW (ref >60)
Glucose, Bld: 109 mg/dL — ABNORMAL HIGH (ref 70–99)
Potassium: 3 mmol/L — ABNORMAL LOW (ref 3.5–5.1)
Sodium: 143 mmol/L (ref 135–145)

## 2021-10-28 LAB — PHOSPHORUS: Phosphorus: 1.9 mg/dL — ABNORMAL LOW (ref 2.5–4.6)

## 2021-10-28 LAB — PREPARE RBC (CROSSMATCH)

## 2021-10-28 LAB — T4, FREE: Free T4: 1.11 ng/dL (ref 0.61–1.12)

## 2021-10-28 MED ORDER — VITAMIN D (ERGOCALCIFEROL) 1.25 MG (50000 UNIT) PO CAPS
50000.0000 [IU] | ORAL_CAPSULE | ORAL | Status: DC
  Administered 2021-10-28 – 2021-11-04 (×2): 50000 [IU] via ORAL
  Filled 2021-10-28 (×2): qty 1

## 2021-10-28 MED ORDER — SODIUM CHLORIDE 0.9% IV SOLUTION: Freq: Once | INTRAVENOUS | Status: AC

## 2021-10-28 MED ORDER — POTASSIUM PHOSPHATES 15 MMOLE/5ML IV SOLN
30.0000 mmol | Freq: Once | INTRAVENOUS | Status: AC
  Administered 2021-10-28: 13:00:00 30 mmol via INTRAVENOUS
  Filled 2021-10-28: qty 10

## 2021-10-28 NOTE — Progress Notes (Signed)
   10/28/21 1037  Assess: MEWS Score  Temp 97.9 F (36.6 C)  BP (!) 93/42  Pulse Rate (!) 108  Resp 18  Level of Consciousness Alert  SpO2 100 %  Assess: MEWS Score  MEWS Temp 0  MEWS Systolic 1  MEWS Pulse 1  MEWS RR 0  MEWS LOC 0  MEWS Score 2  MEWS Score Color Yellow  Assess: if the MEWS score is Yellow or Red  Were vital signs taken at a resting state? Yes  Focused Assessment No change from prior assessment  Does the patient meet 2 or more of the SIRS criteria? Yes  Does the patient have a confirmed or suspected source of infection? No  MEWS guidelines implemented *See Row Information* Yes  Treat  MEWS Interventions Escalated (See documentation below)  Pain Scale 0-10  Pain Score 0  Take Vital Signs  Increase Vital Sign Frequency  Yellow: Q 2hr X 2 then Q 4hr X 2, if remains yellow, continue Q 4hrs  Escalate  MEWS: Escalate Yellow: discuss with charge nurse/RN and consider discussing with provider and RRT  Notify: Charge Nurse/RN  Name of Charge Nurse/RN Notified [place]  Date Charge Nurse/RN Notified 10/28/21  Time Charge Nurse/RN Notified 1045  Document  Patient Outcome Other (Comment) (BP has remained low, HR slightly higher than previously. PRBCs ordered.)  Progress note created (see row info) Yes  Assess: SIRS CRITERIA  SIRS Temperature  0  SIRS Pulse 1  SIRS Respirations  0  SIRS WBC 0  SIRS Score Sum  1

## 2021-10-28 NOTE — Progress Notes (Signed)
PROGRESS NOTE    Kayla Long  HDQ:[id number] DOB: November 30, 1945 DOA: 10/23/2021 PCP: Pcp, No  122A/122A-AA   Assessment & Plan:   Principal Problem:   COVID-19 virus infection Active Problems:   Lymphedema of both lower extremities   IBS (irritable bowel syndrome)   Severe sepsis (HCC)   Acute metabolic encephalopathy   Anorexia   Acute on chronic anemia   Hypomagnesemia   Hypokalemia   Hypotension   Acute kidney injury superimposed on CKD (HCC)   CKD (chronic kidney disease), stage III (HCC)   Pressure injury of skin of buttock   Chronic diastolic CHF (congestive heart failure) (HCC)   Depression   Protein-calorie malnutrition, severe   75 y.o. female with medical history significant of hypotension on midodrine, severe lymphedema, bedbound status, diastolic CHF, hypertension presented to the ED on morning of 10/23/2021 with reports by family of very poor p.o. intake and worsening confusion for the past 3 days.  EMS was called to the home and found patient's blood pressure 87/40.  Patient had been unable to take her midodrine.  Family reported patient recently having cough, very poor appetite with little p.o. intake but no apparent abdominal pain nausea or vomiting.  Family reported patient's mental status nowhere near baseline.  She is typically conversant, witty and quite interactive but has been more withdrawn.  Exposed to COVID-19 from a family member about a week prior.  Family reported patient had more oozing from her decubitus wounds recently.   COVID-19 virus infection - POA with sick family contact about one week prior.  No hypoxia or respiratory symptoms. --completed a course of Remdesivir.     Severe sepsis - due to Covid-19. POA with tachycardia, leukocytosis, hypotension,  AKI and encephalopathy consistent with organ dysfunction.  Lactate normal.   Acute metabolic encephalopathy  multifactorial due to Covid-19 infection, dehydration, worsened anemia, renal  failure, severe malnutrition --alertness and mental status waxes and weans    Acute kidney injury superimposed on CKD stage IIIa - POA --presented with Cr 1.48, baseline Cr around 1.1.  Given 1L of LR in the ED f/b MIVF.  Cr did not improve with IVF.  AKI likely due to hypotension and intravascular depletion. --nephro consulted Plan: --cont NS with Kcl @ 50 ml/hr, per nephro rec     Acute on chronic anemia 2/2 blood loss - baseline Hbg 7.7 three months ago, presented with Hbg 6.5.  Family report increased blood from wounds recently, so likely acute on chronic blood loss. --s/p 2u pRBC Plan: --1u pRBC today for Hgb 6.3 --trend Hgb and transfuse to keep Hgb >7   Anorexia / Failure to thrive  Severe hypoalbuminemia  Severe Malnutrition Dysphagia --albumin low around 1.5 since Aug 2022. --noted to have reduced oral intake and difficulty swallowing meat --pt refused tube feed Plan: --SLP rec Dys 1 with thin fluid --supplements per dietician  --palliative consult following  Chronic Lymphedema of both lower extremities - severe --fluid buildup for the past 2 years, exacerbated by low albumin.  BLE with hardened, cobblestone skin texture, and elephantiasis-appearance.  Skin weeping noted at dependent region. --takes oral lasix at home.  My one attempt at IV diuresis with IV albumin support was not successful. --nephrology consulted for fluid management  Hypokalemia / Hypomagnesemia   Hypophos --monitor and replete PRN   Hypotension, chronic  --Low BP exacerbated by low albumin and fluid 3rd spacing. --stress dose steroid d/c'ed Plan: --cont midodrine --cont NS with Kcl @ 50 ml/hr, per nephro  rec     Chronic diastolic CHF - Echo 05/349 in Care Everywhere EF >55%, grade II diastolic dysfunction.     IBS (irritable bowel syndrome)  - report as chronic issue. --home Senna-S PRN continued   Pressure injury of skin of buttock  Extensive skin breakdown Family reported recently  worsening skin breakdown and bleeding, from exposure to stool and urine and skin weeping.  It takes 5 people to turn pt for cleaning, and also painful for pt. --WOC consulted. Plan: --wound care per order --foley for now  Depression  - continue home Paxil   DVT prophylaxis: Lovenox SQ Code Status: Full code  Family Communication: daughter updated at the bedside today Level of care: Med-Surg Dispo:   The patient is from: home Anticipated d/c is to: home Anticipated d/c date is: > 3 days Patient currently is not medically ready to d/c due to: AKI, hypotension   Subjective and Interval History:  Pt reported feeling better today.  Didn't refuse her protein supplements today.   Objective: Vitals:   10/28/21 0501 10/28/21 1037 10/28/21 1243 10/28/21 1311  BP: (!) 94/42 (!) 93/42 (!) 90/41 (!) 91/38  Pulse: 99 (!) 108 95 (!) 105  Resp: 18 18 18 16   Temp: 97.9 F (36.6 C) 97.9 F (36.6 C) 98.8 F (37.1 C) 98.3 F (36.8 C)  TempSrc: Oral Oral Oral Oral  SpO2: 100% 100% 100% 100%  Weight:        Intake/Output Summary (Last 24 hours) at 10/28/2021 1658 Last data filed at 10/28/2021 1645 Gross per 24 hour  Intake 1606.61 ml  Output 200 ml  Net 1406.61 ml    Filed Weights   10/23/21 1354  Weight: (!) 196 kg    Examination:   Constitutional: NAD, AAOx3, signs of temporal wasting, upper body skeletal, but massive lower body with edema HEENT: conjunctivae and lids normal, EOMI CV: No cyanosis.   RESP: normal respiratory effort, on RA Extremities: both thighs and lower legs elephantiasis-appearing with cobblestone skin texture on the anterior side SKIN: severe maceration and blood oozing on the buttocks and under side of thighs Psych: depressed mood and affect.    Photos taken on 10/28/21          Data Reviewed: I have personally reviewed following labs and imaging studies  CBC: Recent Labs  Lab 10/23/21 1000 10/24/21 0645 10/25/21 0612 10/26/21 0507  10/27/21 0632 10/28/21 0514  WBC 13.4* 11.5* 14.3* 16.3* 17.9* 15.6*  NEUTROABS 11.1* 9.1* 13.0*  --   --   --   HGB 6.5* 7.5* 6.8* 8.2* 7.4* 6.3*  HCT 20.1* 21.5* 19.7* 23.0* 21.3* 18.0*  MCV 86.3 83.0 84.5 83.0 84.5 83.7  PLT 220 178 163 147* 124* 093*   Basic Metabolic Panel: Recent Labs  Lab 10/24/21 0509 10/25/21 0612 10/26/21 0507 10/27/21 0632 10/28/21 0514  NA 138 139 139 142 143  K 2.7* 3.5 2.6* 2.8* 3.0*  CL 106 111 108 110 113*  CO2 24 22 23 23 24   GLUCOSE 67* 237* 132* 121* 109*  BUN 18 20 22  29* 31*  CREATININE 1.54* 1.58* 1.70* 1.76* 1.78*  CALCIUM 6.8* 6.7* 7.2* 7.5* 7.4*  MG 1.6* 1.8 1.8 1.8 1.7  PHOS 2.1* 2.4*  --  1.8* 1.9*   GFR: Estimated Creatinine Clearance: 50.7 mL/min (A) (by C-G formula based on SCr of 1.78 mg/dL (H)). Liver Function Tests: Recent Labs  Lab 10/23/21 0827 10/24/21 0509 10/25/21 0612  AST 16 12* 9*  ALT 7 6  6  ALKPHOS 132* 109 97  BILITOT 0.7 1.0 0.8  PROT 6.0* 5.5* 5.4*  ALBUMIN <1.5* <1.5* 1.5*   No results for input(s): LIPASE, AMYLASE in the last 168 hours. No results for input(s): AMMONIA in the last 168 hours. Coagulation Profile: No results for input(s): INR, PROTIME in the last 168 hours. Cardiac Enzymes: No results for input(s): CKTOTAL, CKMB, CKMBINDEX, TROPONINI in the last 168 hours. BNP (last 3 results) No results for input(s): PROBNP in the last 8760 hours. HbA1C: No results for input(s): HGBA1C in the last 72 hours. CBG: No results for input(s): GLUCAP in the last 168 hours. Lipid Profile: No results for input(s): CHOL, HDL, LDLCALC, TRIG, CHOLHDL, LDLDIRECT in the last 72 hours. Thyroid Function Tests: Recent Labs    10/27/21 0632 10/28/21 0514  TSH 9.469*  --   FREET4  --  1.11   Anemia Panel: No results for input(s): VITAMINB12, FOLATE, FERRITIN, TIBC, IRON, RETICCTPCT in the last 72 hours.  Sepsis Labs: Recent Labs  Lab 10/23/21 1000 10/27/21 4627  PROCALCITON  --  41.57  LATICACIDVEN  1.7  --     Recent Results (from the past 240 hour(s))  Culture, blood (routine x 2)     Status: None   Collection Time: 10/23/21  8:27 AM   Specimen: BLOOD  Result Value Ref Range Status   Specimen Description BLOOD LEFT HAND  Final   Special Requests   Final    BOTTLES DRAWN AEROBIC ONLY Blood Culture results may not be optimal due to an inadequate volume of blood received in culture bottles   Culture   Final    NO GROWTH 5 DAYS Performed at [facility], [address] [place], [place] [address]    Report Status 10/28/2021 FINAL  Final  Resp Panel by RT-PCR (Flu A&B, Covid) Nasopharyngeal Swab     Status: Abnormal   Collection Time: 10/23/21 10:07 AM   Specimen: Nasopharyngeal Swab; Nasopharyngeal(NP) swabs in vial transport medium  Result Value Ref Range Status   SARS Coronavirus 2 by RT PCR POSITIVE (A) NEGATIVE Final    Comment: RESULT CALLED TO, READ BACK BY AND VERIFIED WITH: MAGGIE BARBRA 10/23/21 1140 (NOTE) SARS-CoV-2 target nucleic acids are DETECTED.  The SARS-CoV-2 RNA is generally detectable in upper respiratory specimens during the acute phase of infection. Positive results are indicative of the presence of the identified virus, but do not rule out bacterial infection or co-infection with other pathogens not detected by the test. Clinical correlation with patient history and other diagnostic information is necessary to determine patient infection status. The expected result is Negative.  Fact Sheet for Patients: EntrepreneurPulse.com.au  Fact Sheet for Healthcare Providers: IncredibleEmployment.be  This test is not yet approved or cleared by the [place] FDA and  has been authorized for detection and/or diagnosis of SARS-CoV-2 by FDA under an Emergency Use Authorization (EUA).  This EUA will remain in effect (meaning this test can be used)  for the duration of  the COVID-19 declaration under Section  564(b)(1) of the Act, 21 U.S.C. section 360bbb-3(b)(1), unless the authorization is terminated or revoked sooner.     Influenza A by PCR NEGATIVE NEGATIVE Final   Influenza B by PCR NEGATIVE NEGATIVE Final    Comment: (NOTE) The Xpert Xpress SARS-CoV-2/FLU/RSV plus assay is intended as an aid in the diagnosis of influenza from Nasopharyngeal swab specimens and should not be used as a sole basis for treatment. Nasal washings and aspirates are unacceptable for Xpert  Xpress SARS-CoV-2/FLU/RSV testing.  Fact Sheet for Patients: EntrepreneurPulse.com.au  Fact Sheet for Healthcare Providers: IncredibleEmployment.be  This test is not yet approved or cleared by the [place] FDA and has been authorized for detection and/or diagnosis of SARS-CoV-2 by FDA under an Emergency Use Authorization (EUA). This EUA will remain in effect (meaning this test can be used) for the duration of the COVID-19 declaration under Section 564(b)(1) of the Act, 21 U.S.C. section 360bbb-3(b)(1), unless the authorization is terminated or revoked.  Performed at [facility], [address] [place], [place] [address]   Culture, blood (routine x 2)     Status: None   Collection Time: 10/23/21 11:21 AM   Specimen: BLOOD  Result Value Ref Range Status   Specimen Description BLOOD RIGHT ANTECUBITAL  Final   Special Requests   Final    BOTTLES DRAWN AEROBIC AND ANAEROBIC Blood Culture adequate volume   Culture   Final    NO GROWTH 5 DAYS Performed at [facility], [address], [place], [place] [address]    Report Status 10/28/2021 FINAL  Final      Radiology Studies: No results found.   Scheduled Meds:  (feeding supplement) PROSource Plus  30 mL Oral TID BM   sodium chloride   Intravenous Once   acetaminophen  1,000 mg Oral TID   vitamin C  500 mg Oral Daily   Chlorhexidine Gluconate Cloth  6 each Topical Daily   enoxaparin (LOVENOX)  injection  0.5 mg/kg Subcutaneous Q24H   feeding supplement  237 mL Oral TID BM   Ipratropium-Albuterol  1 puff Inhalation Q6H   midodrine  10 mg Oral TID WC   multivitamin with minerals  1 tablet Oral Daily   pantoprazole  40 mg Oral QAC breakfast   PARoxetine  30 mg Oral Daily   sodium chloride flush  10-40 mL Intracatheter Q12H   Vitamin D (Ergocalciferol)  50,000 Units Oral Q7 days   zinc oxide   Topical TID   zinc sulfate  220 mg Oral Daily   Continuous Infusions:  sodium chloride 5 mL/hr at 10/26/21 1335   0.9 % NaCl with KCl 20 mEq / L 50 mL/hr at 10/28/21 1230   potassium PHOSPHATE IVPB (in mmol) 30 mmol (10/28/21 1235)     LOS: 5 days     Enzo Bi, MD [facility] If 7PM-7AM, please contact night-coverage 10/28/2021, 4:58 PM

## 2021-10-28 NOTE — Progress Notes (Signed)
[facility]  ROUNDING NOTE   Subjective:  [name] is admitted to [facility] on 10/23/2021 for altered mental status. Patient was found hypotensive and unable to take her medications. Found to have COVID-19.Nephrology consulted for acute kidney injury.   Update Patient resting in bed, staff at bedside providing care. No acute distress noted.   Objective:  Vital signs in last 24 hours:  Temp:  [97.5 F (36.4 C)-97.9 F (36.6 C)] 97.9 F (36.6 C) (12/10 1037) Pulse Rate:  [96-108] 108 (12/10 1037) Resp:  [18-20] 18 (12/10 1037) BP: (81-95)/(40-42) 93/42 (12/10 1037) SpO2:  [100 %] 100 % (12/10 1037)  Weight change:  Filed Weights   10/23/21 1354  Weight: (!) 196 kg    Intake/Output: I/O last 3 completed shifts: In: 1256.6 [I.V.:456.6; IV Piggyback:800] Out: 200 [Urine:200]   Intake/Output this shift:  No intake/output data recorded.  Physical Exam: General: Resting in bed, in no acute distess  Head: Normocephalic, atraumatic.Temporal wasting +  Lungs:  Respirations even, unlabored,lungs diminished  Heart: Regular   Abdomen:  Obese   Extremities: Lymphedema 3+  Neurologic: Awake, alert, unable to answer to simple questions appropriately  Skin: No lesions or rashes  F/C draining dark amber urine  Basic Metabolic Panel: Recent Labs  Lab 10/24/21 0509 10/25/21 0612 10/26/21 0507 10/27/21 0632 10/28/21 0514  NA 138 139 139 142 143  K 2.7* 3.5 2.6* 2.8* 3.0*  CL 106 111 108 110 113*  CO2 24 22 23 23 24   GLUCOSE 67* 237* 132* 121* 109*  BUN 18 20 22  29* 31*  CREATININE 1.54* 1.58* 1.70* 1.76* 1.78*  CALCIUM 6.8* 6.7* 7.2* 7.5* 7.4*  MG 1.6* 1.8 1.8 1.8 1.7  PHOS 2.1* 2.4*  --  1.8* 1.9*    Liver Function Tests: Recent Labs  Lab 10/23/21 0827 10/24/21 0509 10/25/21 0612  AST 16 12* 9*  ALT 7 6 6   ALKPHOS 132* 109 97  BILITOT 0.7 1.0 0.8  PROT 6.0* 5.5* 5.4*  ALBUMIN <1.5* <1.5* 1.5*   No results for input(s): LIPASE, AMYLASE in  the last 168 hours. No results for input(s): AMMONIA in the last 168 hours.  CBC: Recent Labs  Lab 10/23/21 1000 10/24/21 0645 10/25/21 0612 10/26/21 0507 10/27/21 0632 10/28/21 0514  WBC 13.4* 11.5* 14.3* 16.3* 17.9* 15.6*  NEUTROABS 11.1* 9.1* 13.0*  --   --   --   HGB 6.5* 7.5* 6.8* 8.2* 7.4* 6.3*  HCT 20.1* 21.5* 19.7* 23.0* 21.3* 18.0*  MCV 86.3 83.0 84.5 83.0 84.5 83.7  PLT 220 178 163 147* 124* 122*    Cardiac Enzymes: No results for input(s): CKTOTAL, CKMB, CKMBINDEX, TROPONINI in the last 168 hours.  BNP: Invalid input(s): POCBNP  CBG: No results for input(s): GLUCAP in the last 168 hours.  Microbiology: Results for orders placed or performed during the hospital encounter of 10/23/21  Culture, blood (routine x 2)     Status: None   Collection Time: 10/23/21  8:27 AM   Specimen: BLOOD  Result Value Ref Range Status   Specimen Description BLOOD LEFT HAND  Final   Special Requests   Final    BOTTLES DRAWN AEROBIC ONLY Blood Culture results may not be optimal due to an inadequate volume of blood received in culture bottles   Culture   Final    NO GROWTH 5 DAYS Performed at [facility], [address] [place],  [address]    Report Status 10/28/2021 FINAL  Final  Resp Panel by RT-PCR (Flu A&B, Covid) Nasopharyngeal Swab     Status: Abnormal   Collection Time: 10/23/21 10:07 AM   Specimen: Nasopharyngeal Swab; Nasopharyngeal(NP) swabs in vial transport medium  Result Value Ref Range Status   SARS Coronavirus 2 by RT PCR POSITIVE (A) NEGATIVE Final    Comment: RESULT CALLED TO, READ BACK BY AND VERIFIED WITH: MAGGIE BARBRA 10/23/21 1140 (NOTE) SARS-CoV-2 target nucleic acids are DETECTED.  The SARS-CoV-2 RNA is generally detectable in upper respiratory specimens during the acute phase of infection. Positive results are indicative of the presence of the identified virus, but do not rule out bacterial infection or co-infection with other  pathogens not detected by the test. Clinical correlation with patient history and other diagnostic information is necessary to determine patient infection status. The expected result is Negative.  Fact Sheet for Patients: EntrepreneurPulse.com.au  Fact Sheet for Healthcare Providers: IncredibleEmployment.be  This test is not yet approved or cleared by the [place] FDA and  has been authorized for detection and/or diagnosis of SARS-CoV-2 by FDA under an Emergency Use Authorization (EUA).  This EUA will remain in effect (meaning this test can be used)  for the duration of  the COVID-19 declaration under Section 564(b)(1) of the Act, 21 U.S.C. section 360bbb-3(b)(1), unless the authorization is terminated or revoked sooner.     Influenza A by PCR NEGATIVE NEGATIVE Final   Influenza B by PCR NEGATIVE NEGATIVE Final    Comment: (NOTE) The Xpert Xpress SARS-CoV-2/FLU/RSV plus assay is intended as an aid in the diagnosis of influenza from Nasopharyngeal swab specimens and should not be used as a sole basis for treatment. Nasal washings and aspirates are unacceptable for Xpert Xpress SARS-CoV-2/FLU/RSV testing.  Fact Sheet for Patients: EntrepreneurPulse.com.au  Fact Sheet for Healthcare Providers: IncredibleEmployment.be  This test is not yet approved or cleared by the [place] FDA and has been authorized for detection and/or diagnosis of SARS-CoV-2 by FDA under an Emergency Use Authorization (EUA). This EUA will remain in effect (meaning this test can be used) for the duration of the COVID-19 declaration under Section 564(b)(1) of the Act, 21 U.S.C. section 360bbb-3(b)(1), unless the authorization is terminated or revoked.  Performed at [facility], [address] [place], [place] [address]   Culture, blood (routine x 2)     Status: None   Collection Time: 10/23/21 11:21 AM    Specimen: BLOOD  Result Value Ref Range Status   Specimen Description BLOOD RIGHT ANTECUBITAL  Final   Special Requests   Final    BOTTLES DRAWN AEROBIC AND ANAEROBIC Blood Culture adequate volume   Culture   Final    NO GROWTH 5 DAYS Performed at [facility] [address] [place], [place] [address]    Report Status 10/28/2021 FINAL  Final    Coagulation Studies: No results for input(s): LABPROT, INR in the last 72 hours.  Urinalysis: No results for input(s): COLORURINE, LABSPEC, PHURINE, GLUCOSEU, HGBUR, BILIRUBINUR, KETONESUR, PROTEINUR, UROBILINOGEN, NITRITE, LEUKOCYTESUR in the last 72 hours.  Invalid input(s): APPERANCEUR    Imaging: No results found.   Medications:    sodium chloride 5 mL/hr at 10/26/21 1335   0.9 % NaCl with KCl 20 mEq / L 50 mL/hr at 10/27/21 1216    (feeding supplement) PROSource Plus  30 mL Oral TID BM   sodium chloride   Intravenous Once   sodium chloride   Intravenous Once   acetaminophen  1,000 mg Oral TID   vitamin  C  500 mg Oral Daily   Chlorhexidine Gluconate Cloth  6 each Topical Daily   enoxaparin (LOVENOX) injection  0.5 mg/kg Subcutaneous Q24H   feeding supplement  237 mL Oral TID BM   Ipratropium-Albuterol  1 puff Inhalation Q6H   midodrine  10 mg Oral TID WC   multivitamin with minerals  1 tablet Oral Daily   pantoprazole  40 mg Oral QAC breakfast   PARoxetine  30 mg Oral Daily   sodium chloride flush  10-40 mL Intracatheter Q12H   zinc oxide   Topical TID   zinc sulfate  220 mg Oral Daily   sodium chloride, bisacodyl, guaiFENesin-dextromethorphan, ondansetron **OR** ondansetron (ZOFRAN) IV, senna-docusate, sodium chloride flush  Assessment/ Plan:  [name] is a 75 y.o.  female with chronic lymphedema, hyperlipidemia, hypotension, GERD, depression, anemia, history of DVT who has been immobile for over 2 years and now admitted to [facility] on 10/23/2021 for Hypokalemia [E87.6] Hypomagnesemia  [E83.42] Anemia, unspecified type [D64.9] COVID-19 virus infection [U07.1] COVID-19 [U07.1]  Acute Kidney Injury on chronic kidney disease stage IIIA with baseline creatinine 1.13 and GFR of 51 on 07/11/2021  Lab Results  Component Value Date   CREATININE 1.78 (H) 10/28/2021   CREATININE 1.76 (H) 10/27/2021   CREATININE 1.70 (H) 10/26/2021    Intake/Output Summary (Last 24 hours) at 10/28/2021 1059 Last data filed at 10/28/2021 0546 Gross per 24 hour  Intake 1256.61 ml  Output 200 ml  Net 1056.61 ml   IV NS with 20 meq KCL 50 ml/hr on flow Dark amber urine draining via F/C No acute indication for dialysis Will continue monitoring closely  Hypotension: volume depletion and malnutrition.        BP 93/42 this morning       Continue Midodrine 10 mg TID        Continue IV fluids      Hypokalemia: with malnutrition. Hypokalemic on admission.       Continue  IV NS with 20 meq KCL 50 ml/hour   Chronic lympedema: low BNP.  No evidence of volume overload.    LOS: 5 Asiyah Pineau 12/10/202210:59 AM

## 2021-10-29 DIAGNOSIS — U071 COVID-19: Secondary | ICD-10-CM | POA: Diagnosis not present

## 2021-10-29 LAB — TYPE AND SCREEN
ABO/RH(D): B POS
Antibody Screen: NEGATIVE
Unit division: 0

## 2021-10-29 LAB — GLUCOSE, CAPILLARY
Glucose-Capillary: 78 mg/dL (ref 70–99)
Glucose-Capillary: 114 mg/dL — ABNORMAL HIGH (ref 70–99)

## 2021-10-29 LAB — BASIC METABOLIC PANEL
Anion gap: 8 (ref 5–15)
BUN: 33 mg/dL — ABNORMAL HIGH (ref 8–23)
CO2: 23 mmol/L (ref 22–32)
Calcium: 7.6 mg/dL — ABNORMAL LOW (ref 8.9–10.3)
Chloride: 111 mmol/L (ref 98–111)
Creatinine, Ser: 1.58 mg/dL — ABNORMAL HIGH (ref 0.44–1.00)
GFR, Estimated: 34 mL/min — ABNORMAL LOW (ref >60)
Glucose, Bld: 90 mg/dL (ref 70–99)
Potassium: 3.2 mmol/L — ABNORMAL LOW (ref 3.5–5.1)
Sodium: 142 mmol/L (ref 135–145)

## 2021-10-29 LAB — CBC
HCT: 21.5 % — ABNORMAL LOW (ref 36.0–46.0)
Hemoglobin: 7.4 g/dL — ABNORMAL LOW (ref 12.0–15.0)
MCH: 28.1 pg (ref 26.0–34.0)
MCHC: 34.4 g/dL (ref 30.0–36.0)
MCV: 81.7 fL (ref 80.0–100.0)
Platelets: 149 10*3/uL — ABNORMAL LOW (ref 150–400)
RBC: 2.63 MIL/uL — ABNORMAL LOW (ref 3.87–5.11)
RDW: 17.9 % — ABNORMAL HIGH (ref 11.5–15.5)
WBC: 13.2 10*3/uL — ABNORMAL HIGH (ref 4.0–10.5)
nRBC: 0 % (ref 0.0–0.2)

## 2021-10-29 LAB — PHOSPHORUS: Phosphorus: 2.3 mg/dL — ABNORMAL LOW (ref 2.5–4.6)

## 2021-10-29 LAB — BPAM RBC
Blood Product Expiration Date: 202301072359
ISSUE DATE / TIME: 202212101256
Unit Type and Rh: 7300

## 2021-10-29 LAB — MAGNESIUM: Magnesium: 1.7 mg/dL (ref 1.7–2.4)

## 2021-10-29 MED ORDER — SPIRONOLACTONE 25 MG PO TABS
25.0000 mg | ORAL_TABLET | Freq: Every day | ORAL | Status: DC
  Administered 2021-10-29 – 2021-10-30 (×2): 25 mg via ORAL
  Filled 2021-10-29 (×2): qty 1

## 2021-10-29 MED ORDER — POTASSIUM PHOSPHATES 15 MMOLE/5ML IV SOLN
30.0000 mmol | Freq: Once | INTRAVENOUS | Status: AC
  Administered 2021-10-29: 12:00:00 30 mmol via INTRAVENOUS
  Filled 2021-10-29: qty 10

## 2021-10-29 MED ORDER — MENTHOL 3 MG MT LOZG
1.0000 | LOZENGE | OROMUCOSAL | Status: DC | PRN
  Filled 2021-10-29: qty 9

## 2021-10-29 MED ORDER — POTASSIUM CHLORIDE 20 MEQ PO PACK
40.0000 meq | PACK | Freq: Once | ORAL | Status: AC
  Administered 2021-10-29: 40 meq via ORAL
  Filled 2021-10-29: qty 2

## 2021-10-29 NOTE — Progress Notes (Signed)
[facility]  ROUNDING NOTE   Subjective:  Kayla Long is admitted to [facility] on 10/23/2021 for altered mental status. Patient was found hypotensive and unable to take her medications. Found to have COVID-19.Nephrology consulted for acute kidney injury.   Update Patient found sitting up in bed, consuming breakfast, reports she does not like the texture of the food, otherwise no complaints. She denies worsening SOB, nausea or vomiting.   Objective:  Vital signs in last 24 hours:  Temp:  [97.6 F (36.4 C)-99 F (37.2 C)] 97.7 F (36.5 C) (12/11 0851) Pulse Rate:  [101-115] 115 (12/11 0851) Resp:  [16-20] 16 (12/11 0851) BP: (89-102)/(36-50) 101/41 (12/11 0851) SpO2:  [98 %-100 %] 100 % (12/11 0851)  Weight change:  Filed Weights   10/23/21 1354  Weight: (!) 196 kg    Intake/Output: I/O last 3 completed shifts: In: 1237.6 [I.V.:187.6; Blood:350; IV Piggyback:700] Out: 450 [Urine:450]   Intake/Output this shift:  Total I/O In: 10 [I.V.:10] Out: 400 [Urine:400]  Physical Exam: General:  In no acute distess  Head: Normocephalic, atraumatic.Temporal wasting +  Lungs:  Respirations even, unlabored,lungs diminished  Heart: S1S2, no rubs or gallops  Abdomen:  Obese   Extremities: Lymphedema 3+  Neurologic: Awake, alert, able to answer to simple questions appropriately  Skin: lower legs with cobblestone skin texture, chronic ulcers +  F/C draining dark amber urine,no hematuria noted  Basic Metabolic Panel: Recent Labs  Lab 10/24/21 0509 10/25/21 0612 10/26/21 0507 10/27/21 0632 10/28/21 0514 10/29/21 0558  NA 138 139 139 142 143 142  K 2.7* 3.5 2.6* 2.8* 3.0* 3.2*  CL 106 111 108 110 113* 111  CO2 24 22 23 23 24 23   GLUCOSE 67* 237* 132* 121* 109* 90  BUN 18 20 22  29* 31* 33*  CREATININE 1.54* 1.58* 1.70* 1.76* 1.78* 1.58*  CALCIUM 6.8* 6.7* 7.2* 7.5* 7.4* 7.6*  MG 1.6* 1.8 1.8 1.8 1.7 1.7  PHOS 2.1* 2.4*  --  1.8* 1.9* 2.3*     Liver  Function Tests: Recent Labs  Lab 10/23/21 0827 10/24/21 0509 10/25/21 0612  AST 16 12* 9*  ALT 7 6 6   ALKPHOS 132* 109 97  BILITOT 0.7 1.0 0.8  PROT 6.0* 5.5* 5.4*  ALBUMIN <1.5* <1.5* 1.5*    No results for input(s): LIPASE, AMYLASE in the last 168 hours. No results for input(s): AMMONIA in the last 168 hours.  CBC: Recent Labs  Lab 10/23/21 1000 10/24/21 0645 10/25/21 0612 10/26/21 0507 10/27/21 1694 10/28/21 0514 10/29/21 0558  WBC 13.4* 11.5* 14.3* 16.3* 17.9* 15.6* 13.2*  NEUTROABS 11.1* 9.1* 13.0*  --   --   --   --   HGB 6.5* 7.5* 6.8* 8.2* 7.4* 6.3* 7.4*  HCT 20.1* 21.5* 19.7* 23.0* 21.3* 18.0* 21.5*  MCV 86.3 83.0 84.5 83.0 84.5 83.7 81.7  PLT 220 178 163 147* 124* 122* 149*     Cardiac Enzymes: No results for input(s): CKTOTAL, CKMB, CKMBINDEX, TROPONINI in the last 168 hours.  BNP: Invalid input(s): POCBNP  CBG: Recent Labs  Lab 10/29/21 0848  GLUCAP 51    Microbiology: Results for orders placed or performed during the hospital encounter of 10/23/21  Culture, blood (routine x 2)     Status: None   Collection Time: 10/23/21  8:27 AM   Specimen: BLOOD  Result Value Ref Range Status   Specimen Description BLOOD LEFT HAND  Final   Special Requests   Final    BOTTLES DRAWN  AEROBIC ONLY Blood Culture results may not be optimal due to an inadequate volume of blood received in culture bottles   Culture   Final    NO GROWTH 5 DAYS Performed at [facility], [address] [place], [place] [address]    Report Status 10/28/2021 FINAL  Final  Resp Panel by RT-PCR (Flu A&B, Covid) Nasopharyngeal Swab     Status: Abnormal   Collection Time: 10/23/21 10:07 AM   Specimen: Nasopharyngeal Swab; Nasopharyngeal(NP) swabs in vial transport medium  Result Value Ref Range Status   SARS Coronavirus 2 by RT PCR POSITIVE (A) NEGATIVE Final    Comment: RESULT CALLED TO, READ BACK BY AND VERIFIED WITH: MAGGIE BARBRA 10/23/21 1140 (NOTE) SARS-CoV-2  target nucleic acids are DETECTED.  The SARS-CoV-2 RNA is generally detectable in upper respiratory specimens during the acute phase of infection. Positive results are indicative of the presence of the identified virus, but do not rule out bacterial infection or co-infection with other pathogens not detected by the test. Clinical correlation with patient history and other diagnostic information is necessary to determine patient infection status. The expected result is Negative.  Fact Sheet for Patients: EntrepreneurPulse.com.au  Fact Sheet for Healthcare Providers: IncredibleEmployment.be  This test is not yet approved or cleared by the [place] FDA and  has been authorized for detection and/or diagnosis of SARS-CoV-2 by FDA under an Emergency Use Authorization (EUA).  This EUA will remain in effect (meaning this test can be used)  for the duration of  the COVID-19 declaration under Section 564(b)(1) of the Act, 21 U.S.C. section 360bbb-3(b)(1), unless the authorization is terminated or revoked sooner.     Influenza A by PCR NEGATIVE NEGATIVE Final   Influenza B by PCR NEGATIVE NEGATIVE Final    Comment: (NOTE) The Xpert Xpress SARS-CoV-2/FLU/RSV plus assay is intended as an aid in the diagnosis of influenza from Nasopharyngeal swab specimens and should not be used as a sole basis for treatment. Nasal washings and aspirates are unacceptable for Xpert Xpress SARS-CoV-2/FLU/RSV testing.  Fact Sheet for Patients: EntrepreneurPulse.com.au  Fact Sheet for Healthcare Providers: IncredibleEmployment.be  This test is not yet approved or cleared by the [place] FDA and has been authorized for detection and/or diagnosis of SARS-CoV-2 by FDA under an Emergency Use Authorization (EUA). This EUA will remain in effect (meaning this test can be used) for the duration of the COVID-19 declaration under Section  564(b)(1) of the Act, 21 U.S.C. section 360bbb-3(b)(1), unless the authorization is terminated or revoked.  Performed at [facility], [address] [place], [place] [address]   Culture, blood (routine x 2)     Status: None   Collection Time: 10/23/21 11:21 AM   Specimen: BLOOD  Result Value Ref Range Status   Specimen Description BLOOD RIGHT ANTECUBITAL  Final   Special Requests   Final    BOTTLES DRAWN AEROBIC AND ANAEROBIC Blood Culture adequate volume   Culture   Final    NO GROWTH 5 DAYS Performed at [facility], [address], [place], [place] [address]    Report Status 10/28/2021 FINAL  Final    Coagulation Studies: No results for input(s): LABPROT, INR in the last 72 hours.  Urinalysis: No results for input(s): COLORURINE, LABSPEC, PHURINE, GLUCOSEU, HGBUR, BILIRUBINUR, KETONESUR, PROTEINUR, UROBILINOGEN, NITRITE, LEUKOCYTESUR in the last 72 hours.  Invalid input(s): APPERANCEUR    Imaging: No results found.   Medications:    sodium chloride 5 mL/hr at 10/26/21 1335  potassium PHOSPHATE IVPB (in mmol) 30 mmol (10/29/21 1213)    (feeding supplement) PROSource Plus  30 mL Oral TID BM   sodium chloride   Intravenous Once   acetaminophen  1,000 mg Oral TID   vitamin C  500 mg Oral Daily   Chlorhexidine Gluconate Cloth  6 each Topical Daily   enoxaparin (LOVENOX) injection  0.5 mg/kg Subcutaneous Q24H   feeding supplement  237 mL Oral TID BM   Ipratropium-Albuterol  1 puff Inhalation Q6H   midodrine  10 mg Oral TID WC   multivitamin with minerals  1 tablet Oral Daily   pantoprazole  40 mg Oral QAC breakfast   PARoxetine  30 mg Oral Daily   sodium chloride flush  10-40 mL Intracatheter Q12H   spironolactone  25 mg Oral Daily   Vitamin D (Ergocalciferol)  50,000 Units Oral Q7 days   zinc oxide   Topical TID   zinc sulfate  220 mg Oral Daily   sodium chloride, bisacodyl, guaiFENesin-dextromethorphan, ondansetron **OR** ondansetron  (ZOFRAN) IV, senna-docusate, sodium chloride flush  Assessment/ Plan:  Ms. TAYLOR SPILDE is a 75 y.o.  female with chronic lymphedema, hyperlipidemia, hypotension, GERD, depression, anemia, history of DVT who has been immobile for over 2 years and now admitted to [facility] on 10/23/2021 for Hypokalemia [E87.6] Hypomagnesemia [E83.42] Anemia, unspecified type [D64.9] COVID-19 virus infection [U07.1] COVID-19 [U07.1]  Acute Kidney Injury on chronic kidney disease stage IIIA with baseline creatinine 1.13 and GFR of 51 on 07/11/2021  Lab Results  Component Value Date   CREATININE 1.58 (H) 10/29/2021   CREATININE 1.78 (H) 10/28/2021   CREATININE 1.76 (H) 10/27/2021    Intake/Output Summary (Last 24 hours) at 10/29/2021 1451 Last data filed at 10/29/2021 0936 Gross per 24 hour  Intake 360 ml  Output 650 ml  Net -290 ml   Renal function improving gradually IV NS with 20 meq KCL 50 ml/hr on flow,will discontinue IVF to avoid fluid overload No acute indication for dialysis Will add Spironolactone 25 mg PO daily  Hypotension: volume depletion and malnutrition.        BP readings stay within low normal range       Continue Midodrine 10 mg TID         Hypokalemia: with malnutrition. Hypokalemic on admission.       Potassium improving gradually, 3.2 today, was receiving IVF with potassium        Discontinuing IVF to prevent volume overload      Will add Spironolactone 25 mg PO daily      LOS: 6 Deboraha Goar 12/11/20222:51 PM

## 2021-10-29 NOTE — Progress Notes (Signed)
PROGRESS NOTE    CONCEPTION DOEBLER  GUR:[id number] DOB: 05-24-1946 DOA: 10/23/2021 PCP: Pcp, No  122A/122A-AA   Assessment & Plan:   Principal Problem:   COVID-19 virus infection Active Problems:   Lymphedema of both lower extremities   IBS (irritable bowel syndrome)   Severe sepsis (HCC)   Acute metabolic encephalopathy   Anorexia   Acute on chronic anemia   Hypomagnesemia   Hypokalemia   Hypotension   Acute kidney injury superimposed on CKD (HCC)   CKD (chronic kidney disease), stage III (HCC)   Pressure injury of skin of buttock   Chronic diastolic CHF (congestive heart failure) (HCC)   Depression   Protein-calorie malnutrition, severe   75 y.o. female with medical history significant of hypotension on midodrine, severe lymphedema, bedbound status, diastolic CHF, hypertension presented to the ED on morning of 10/23/2021 with reports by family of very poor p.o. intake and worsening confusion for the past 3 days.  EMS was called to the home and found patient's blood pressure 87/40.  Patient had been unable to take her midodrine.  Family reported patient recently having cough, very poor appetite with little p.o. intake but no apparent abdominal pain nausea or vomiting.  Family reported patient's mental status nowhere near baseline.  She is typically conversant, witty and quite interactive but has been more withdrawn.  Exposed to COVID-19 from a family member about a week prior.  Family reported patient had more oozing from her decubitus wounds recently.   COVID-19 virus infection - POA with sick family contact about one week prior.  No hypoxia or respiratory symptoms. --completed a course of Remdesivir.     Severe sepsis - due to Covid-19. POA with tachycardia, leukocytosis, hypotension,  AKI and encephalopathy consistent with organ dysfunction.  Lactate normal.   Acute metabolic encephalopathy  multifactorial due to Covid-19 infection, dehydration, worsened anemia, renal  failure, severe malnutrition --alertness and mental status waxes and weans    Acute kidney injury superimposed on CKD stage IIIa - POA --presented with Cr 1.48, baseline Cr around 1.1.  Given 1L of LR in the ED f/b MIVF.  Cr did not improve with IVF.  AKI likely due to hypotension and intravascular depletion. --nephro consulted, continued MIVF Plan: --d/c MIVF today, per nephrology   Acute on chronic anemia 2/2 blood loss - baseline Hbg 7.7 three months ago, presented with Hbg 6.5.  Family report increased blood from wounds recently, so likely acute on chronic blood loss. --s/p 3u pRBC Plan: --transfuse to keep Hgb >7   Anorexia / Failure to thrive  Severe hypoalbuminemia  Severe Malnutrition Dysphagia --albumin low around 1.5 since Aug 2022. --noted to have reduced oral intake and difficulty swallowing meat --pt refused tube feed Plan: --SLP rec Dys 1 with thin fluid --supplements per dietician  --SLP re-eval to upgrade diet with more food choices  Chronic Lymphedema of both lower extremities - severe --fluid buildup for the past 2 years, exacerbated by low albumin.  BLE with hardened, cobblestone skin texture, and elephantiasis-appearance.  Skin weeping noted at dependent region. --takes oral lasix at home.  My one attempt at IV diuresis with IV albumin support was not successful. --fluid management per nephrology  Hypokalemia / Hypomagnesemia   Hypophos --monitor and replete PRN --Add spironolactone for hypokalemia, per nephrology   Hypotension, chronic  --Low BP exacerbated by low albumin and fluid 3rd spacing. --stress dose steroid d/c'ed Plan: --cont midodrine --d/c MIVF today, per nephro   Chronic diastolic CHF - Echo  01/2017 in [place] EF >55%, grade II diastolic dysfunction.     IBS (irritable bowel syndrome)  - report as chronic issue. --home Senna-S PRN continued   Pressure injury of skin of buttock  Extensive skin breakdown Family reported  recently worsening skin breakdown and bleeding, from exposure to stool and urine and skin weeping.  It takes 5 people to turn pt for cleaning, and also painful for pt. --WOC consulted. Plan: --wound care per order --foley for now  Depression  - continue home Paxil   DVT prophylaxis: Lovenox SQ Code Status: Full code  Family Communication: daughter updated at the bedside today Level of care: Med-Surg Dispo:   The patient is from: home Anticipated d/c is to: home Anticipated d/c date is: > 3 days Patient currently is not medically ready to d/c due to: AKI, hypotension   Subjective and Interval History:  Daughter reported more coughing with sputum production.  Pt denied dyspnea.  Having runny stools about twice a day.     Objective: Vitals:   10/28/21 2016 10/29/21 0055 10/29/21 0552 10/29/21 0851  BP: (!) 94/36 (!) 102/50 (!) 98/43 (!) 101/41  Pulse: (!) 102 (!) 110 (!) 109 (!) 115  Resp: 18 20 20 16   Temp: 97.9 F (36.6 C) 99 F (37.2 C) 98.8 F (37.1 C) 97.7 F (36.5 C)  TempSrc: Oral Oral Oral Oral  SpO2: 100% 100% 98% 100%  Weight:        Intake/Output Summary (Last 24 hours) at 10/29/2021 1529 Last data filed at 10/29/2021 0936 Gross per 24 hour  Intake 360 ml  Output 650 ml  Net -290 ml    Filed Weights   10/23/21 1354  Weight: (!) 196 kg    Examination:   Constitutional: NAD, AAOx3, temporal wasting, upper body skeletal, but massive lower body with edema HEENT: conjunctivae and lids normal, EOMI CV: No cyanosis.   RESP: normal respiratory effort, on RA Extremities: massive thighs and lower legs with elephantiasis-appearing and cobblestone skin texture on the anterior side SKIN: severe maceration, skin weeping and blood oozing on the buttocks and under side of thighs Psych: depressed mood and affect.    Photos taken on 10/28/21          Data Reviewed: I have personally reviewed following labs and imaging studies  CBC: Recent Labs   Lab 10/23/21 1000 10/24/21 0645 10/25/21 0612 10/26/21 0507 10/27/21 0632 10/28/21 0514 10/29/21 0558  WBC 13.4* 11.5* 14.3* 16.3* 17.9* 15.6* 13.2*  NEUTROABS 11.1* 9.1* 13.0*  --   --   --   --   HGB 6.5* 7.5* 6.8* 8.2* 7.4* 6.3* 7.4*  HCT 20.1* 21.5* 19.7* 23.0* 21.3* 18.0* 21.5*  MCV 86.3 83.0 84.5 83.0 84.5 83.7 81.7  PLT 220 178 163 147* 124* 122* 149*   Basic Metabolic Panel: Recent Labs  Lab 10/24/21 0509 10/25/21 0612 10/26/21 0507 10/27/21 0632 10/28/21 0514 10/29/21 0558  NA 138 139 139 142 143 142  K 2.7* 3.5 2.6* 2.8* 3.0* 3.2*  CL 106 111 108 110 113* 111  CO2 24 22 23 23 24 23   GLUCOSE 67* 237* 132* 121* 109* 90  BUN 18 20 22  29* 31* 33*  CREATININE 1.54* 1.58* 1.70* 1.76* 1.78* 1.58*  CALCIUM 6.8* 6.7* 7.2* 7.5* 7.4* 7.6*  MG 1.6* 1.8 1.8 1.8 1.7 1.7  PHOS 2.1* 2.4*  --  1.8* 1.9* 2.3*   GFR: Estimated Creatinine Clearance: 57.1 mL/min (A) (by C-G formula based on SCr of 1.58  mg/dL (H)). Liver Function Tests: Recent Labs  Lab 10/23/21 0827 10/24/21 0509 10/25/21 0612  AST 16 12* 9*  ALT 7 6 6   ALKPHOS 132* 109 97  BILITOT 0.7 1.0 0.8  PROT 6.0* 5.5* 5.4*  ALBUMIN <1.5* <1.5* 1.5*   No results for input(s): LIPASE, AMYLASE in the last 168 hours. No results for input(s): AMMONIA in the last 168 hours. Coagulation Profile: No results for input(s): INR, PROTIME in the last 168 hours. Cardiac Enzymes: No results for input(s): CKTOTAL, CKMB, CKMBINDEX, TROPONINI in the last 168 hours. BNP (last 3 results) No results for input(s): PROBNP in the last 8760 hours. HbA1C: No results for input(s): HGBA1C in the last 72 hours. CBG: Recent Labs  Lab 10/29/21 0848  GLUCAP 78   Lipid Profile: No results for input(s): CHOL, HDL, LDLCALC, TRIG, CHOLHDL, LDLDIRECT in the last 72 hours. Thyroid Function Tests: Recent Labs    10/27/21 0632 10/28/21 0514  TSH 9.469*  --   FREET4  --  1.11   Anemia Panel: No results for input(s): VITAMINB12,  FOLATE, FERRITIN, TIBC, IRON, RETICCTPCT in the last 72 hours.  Sepsis Labs: Recent Labs  Lab 10/23/21 1000 10/27/21 1062  PROCALCITON  --  41.57  LATICACIDVEN 1.7  --     Recent Results (from the past 240 hour(s))  Culture, blood (routine x 2)     Status: None   Collection Time: 10/23/21  8:27 AM   Specimen: BLOOD  Result Value Ref Range Status   Specimen Description BLOOD LEFT HAND  Final   Special Requests   Final    BOTTLES DRAWN AEROBIC ONLY Blood Culture results may not be optimal due to an inadequate volume of blood received in culture bottles   Culture   Final    NO GROWTH 5 DAYS Performed at [facility], [address] [place], [place] [address]    Report Status 10/28/2021 FINAL  Final  Resp Panel by RT-PCR (Flu A&B, Covid) Nasopharyngeal Swab     Status: Abnormal   Collection Time: 10/23/21 10:07 AM   Specimen: Nasopharyngeal Swab; Nasopharyngeal(NP) swabs in vial transport medium  Result Value Ref Range Status   SARS Coronavirus 2 by RT PCR POSITIVE (A) NEGATIVE Final    Comment: RESULT CALLED TO, READ BACK BY AND VERIFIED WITH: MAGGIE BARBRA 10/23/21 1140 (NOTE) SARS-CoV-2 target nucleic acids are DETECTED.  The SARS-CoV-2 RNA is generally detectable in upper respiratory specimens during the acute phase of infection. Positive results are indicative of the presence of the identified virus, but do not rule out bacterial infection or co-infection with other pathogens not detected by the test. Clinical correlation with patient history and other diagnostic information is necessary to determine patient infection status. The expected result is Negative.  Fact Sheet for Patients: EntrepreneurPulse.com.au  Fact Sheet for Healthcare Providers: IncredibleEmployment.be  This test is not yet approved or cleared by the [place] FDA and  has been authorized for detection and/or diagnosis of SARS-CoV-2 by FDA under an  Emergency Use Authorization (EUA).  This EUA will remain in effect (meaning this test can be used)  for the duration of  the COVID-19 declaration under Section 564(b)(1) of the Act, 21 U.S.C. section 360bbb-3(b)(1), unless the authorization is terminated or revoked sooner.     Influenza A by PCR NEGATIVE NEGATIVE Final   Influenza B by PCR NEGATIVE NEGATIVE Final    Comment: (NOTE) The Xpert Xpress SARS-CoV-2/FLU/RSV plus assay is intended as an aid in the diagnosis  of influenza from Nasopharyngeal swab specimens and should not be used as a sole basis for treatment. Nasal washings and aspirates are unacceptable for Xpert Xpress SARS-CoV-2/FLU/RSV testing.  Fact Sheet for Patients: EntrepreneurPulse.com.au  Fact Sheet for Healthcare Providers: IncredibleEmployment.be  This test is not yet approved or cleared by the [place] FDA and has been authorized for detection and/or diagnosis of SARS-CoV-2 by FDA under an Emergency Use Authorization (EUA). This EUA will remain in effect (meaning this test can be used) for the duration of the COVID-19 declaration under Section 564(b)(1) of the Act, 21 U.S.C. section 360bbb-3(b)(1), unless the authorization is terminated or revoked.  Performed at [facility], [address] [place], [place] [address]   Culture, blood (routine x 2)     Status: None   Collection Time: 10/23/21 11:21 AM   Specimen: BLOOD  Result Value Ref Range Status   Specimen Description BLOOD RIGHT ANTECUBITAL  Final   Special Requests   Final    BOTTLES DRAWN AEROBIC AND ANAEROBIC Blood Culture adequate volume   Culture   Final    NO GROWTH 5 DAYS Performed at [facility], [address], [place], [place] [address]    Report Status 10/28/2021 FINAL  Final      Radiology Studies: No results found.   Scheduled Meds:  (feeding supplement) PROSource Plus  30 mL Oral TID BM   sodium chloride    Intravenous Once   acetaminophen  1,000 mg Oral TID   vitamin C  500 mg Oral Daily   Chlorhexidine Gluconate Cloth  6 each Topical Daily   enoxaparin (LOVENOX) injection  0.5 mg/kg Subcutaneous Q24H   feeding supplement  237 mL Oral TID BM   Ipratropium-Albuterol  1 puff Inhalation Q6H   midodrine  10 mg Oral TID WC   multivitamin with minerals  1 tablet Oral Daily   pantoprazole  40 mg Oral QAC breakfast   PARoxetine  30 mg Oral Daily   sodium chloride flush  10-40 mL Intracatheter Q12H   spironolactone  25 mg Oral Daily   Vitamin D (Ergocalciferol)  50,000 Units Oral Q7 days   zinc oxide   Topical TID   zinc sulfate  220 mg Oral Daily   Continuous Infusions:  sodium chloride 5 mL/hr at 10/26/21 1335   potassium PHOSPHATE IVPB (in mmol) 30 mmol (10/29/21 1213)     LOS: 6 days     Enzo Bi, MD [facility] If 7PM-7AM, please contact night-coverage 10/29/2021, 3:29 PM

## 2021-10-30 DIAGNOSIS — U071 COVID-19: Secondary | ICD-10-CM | POA: Diagnosis not present

## 2021-10-30 LAB — BASIC METABOLIC PANEL
Anion gap: 8 (ref 5–15)
BUN: 37 mg/dL — ABNORMAL HIGH (ref 8–23)
CO2: 21 mmol/L — ABNORMAL LOW (ref 22–32)
Calcium: 7.8 mg/dL — ABNORMAL LOW (ref 8.9–10.3)
Chloride: 109 mmol/L (ref 98–111)
Creatinine, Ser: 1.66 mg/dL — ABNORMAL HIGH (ref 0.44–1.00)
GFR, Estimated: 32 mL/min — ABNORMAL LOW (ref >60)
Glucose, Bld: 99 mg/dL (ref 70–99)
Potassium: 3.6 mmol/L (ref 3.5–5.1)
Sodium: 138 mmol/L (ref 135–145)

## 2021-10-30 LAB — CBC
HCT: 23.8 % — ABNORMAL LOW (ref 36.0–46.0)
Hemoglobin: 8.3 g/dL — ABNORMAL LOW (ref 12.0–15.0)
MCH: 28.5 pg (ref 26.0–34.0)
MCHC: 34.9 g/dL (ref 30.0–36.0)
MCV: 81.8 fL (ref 80.0–100.0)
Platelets: 160 10*3/uL (ref 150–400)
RBC: 2.91 MIL/uL — ABNORMAL LOW (ref 3.87–5.11)
RDW: 18.6 % — ABNORMAL HIGH (ref 11.5–15.5)
WBC: 12.6 10*3/uL — ABNORMAL HIGH (ref 4.0–10.5)
nRBC: 0 % (ref 0.0–0.2)

## 2021-10-30 LAB — PHOSPHORUS: Phosphorus: 2.8 mg/dL (ref 2.5–4.6)

## 2021-10-30 LAB — MAGNESIUM: Magnesium: 1.7 mg/dL (ref 1.7–2.4)

## 2021-10-30 MED ORDER — ZINC OXIDE 40 % EX OINT
TOPICAL_OINTMENT | Freq: Three times a day (TID) | CUTANEOUS | Status: DC
  Administered 2021-10-30 – 2021-11-02 (×5): 1 via TOPICAL
  Filled 2021-10-30 (×6): qty 113

## 2021-10-30 NOTE — Progress Notes (Signed)
Palliative: Chart review completed.  Discussion with bedside nursing staff. At this point, daughter [name] continues to show reluctance for palliative team involvement. Palliative team will continue to shadow for declines.  No charge [name], NP Palliative medicine team Team phone [phone] Greater than 50% of this time was spent counseling and coordinating care related to the above assessment and plan.

## 2021-10-30 NOTE — Progress Notes (Signed)
Patient without distress, co complaints of pain or acute symptoms. Asked patient how she felt overall and she stated "I'm alright". Two daughters at bedside planning to leave shortly. Will continue to monitor.  10/30/21 2046  Assess: MEWS Score  Temp (!) 97.3 F (36.3 C)  BP (!) 95/51  Pulse Rate (!) 149  Resp 16  SpO2 96 %  O2 Device Room Air  Assess: MEWS Score  MEWS Temp 0  MEWS Systolic 1  MEWS Pulse 3  MEWS RR 0  MEWS LOC 0  MEWS Score 4  MEWS Score Color Red  Assess: if the MEWS score is Yellow or Red  Were vital signs taken at a resting state? Yes  Focused Assessment No change from prior assessment  Does the patient have a confirmed or suspected source of infection? Yes  Provider and Rapid Response Notified? No  MEWS guidelines implemented *See Row Information* No, previously red, continue vital signs every 4 hours  Notify: Charge Nurse/RN  Name of Charge Nurse/RN Notified Jackie, RN  Date Charge Nurse/RN Notified 10/30/21  Time Charge Nurse/RN Notified 2050  Assess: SIRS CRITERIA  SIRS Temperature  0  SIRS Pulse 1  SIRS Respirations  0  SIRS WBC 0  SIRS Score Sum  1

## 2021-10-30 NOTE — Progress Notes (Signed)
Patient continues with yellow MEWS.     10/30/21 0813  Assess: MEWS Score  Temp 97.7 F (36.5 C)  BP (!) 96/46  Pulse Rate (!) 126  Resp 20  Level of Consciousness Alert  SpO2 96 %  O2 Device Room Air  Assess: MEWS Score  MEWS Temp 0  MEWS Systolic 1  MEWS Pulse 2  MEWS RR 0  MEWS LOC 0  MEWS Score 3  MEWS Score Color Yellow  Assess: if the MEWS score is Yellow or Red  Were vital signs taken at a resting state? Yes  Focused Assessment No change from prior assessment  Does the patient meet 2 or more of the SIRS criteria? No  Does the patient have a confirmed or suspected source of infection? No  MEWS guidelines implemented *See Row Information* No, previously yellow, continue vital signs every 4 hours  Treat  MEWS Interventions Other (Comment) (continue monitoring)  Escalate  MEWS: Escalate Yellow: discuss with charge nurse/RN and consider discussing with provider and RRT  Notify: Charge Nurse/RN  Name of Charge Nurse/RN Notified Jo, RN  Date Charge Nurse/RN Notified 10/30/21  Time Charge Nurse/RN Notified [phone]  Document  Patient Outcome Other (Comment) (no changes from prior)  Progress note created (see row info) Yes  Assess: SIRS CRITERIA  SIRS Temperature  0  SIRS Pulse 1  SIRS Respirations  0  SIRS WBC 0  SIRS Score Sum  1

## 2021-10-30 NOTE — Progress Notes (Deleted)
Patient without distress, co complaints of pain or acute symptoms. Asked patient how she felt overall and she stated "I'm alright". Two daughters at bedside planning to leave shortly. Will continue to monitor.

## 2021-10-30 NOTE — Progress Notes (Signed)
Patient remains on floor. NAD at this time.    10/30/21 1340  Assess: MEWS Score  Temp 98.6 F (37 C)  BP (!) 92/40  Pulse Rate (!) 131  Resp 19  SpO2 99 %  O2 Device Room Air  Assess: MEWS Score  MEWS Temp 0  MEWS Systolic 1  MEWS Pulse 3  MEWS RR 0  MEWS LOC 0  MEWS Score 4  MEWS Score Color Red  Assess: if the MEWS score is Yellow or Red  Were vital signs taken at a resting state? Yes  Focused Assessment No change from prior assessment  Does the patient meet 2 or more of the SIRS criteria? No  Provider and Rapid Response Notified? No  MEWS guidelines implemented *See Row Information* No, previously red, continue vital signs every 4 hours  Treat  MEWS Interventions Administered scheduled meds/treatments  Pain Score 0  Take Vital Signs  Increase Vital Sign Frequency  Red: Q 1hr X 4 then Q 4hr X 4, if remains red, continue Q 4hrs  Escalate  MEWS: Escalate Red: discuss with charge nurse/RN and provider, consider discussing with RRT  Document  Progress note created (see row info) Yes  Assess: SIRS CRITERIA  SIRS Temperature  0  SIRS Pulse 1  SIRS Respirations  0  SIRS WBC 0  SIRS Score Sum  1

## 2021-10-30 NOTE — Progress Notes (Signed)
Chaplain responded to rapid response to pt's room. No family present. Continued support available per on call chaplain.

## 2021-10-30 NOTE — Significant Event (Signed)
Rapid Response Event Note   Reason for Call :  Red mews, no acute distress  Initial Focused Assessment:  Rapid response RN arrived at patient's room and while getting PPE to enter, conferred with patient's nurse and then reviewed patient's chart. Patient with low PO intake, low albumin, weeping extremities, chronic hypotension on midodrine, anemia. Chart lists CHF but patient's daughter reports patient does not have it. MEWS red due to elevated HR and low BP. BP 93/43 MAP 58, patient's BP chronically runs with SBP in 80-90s per chart. HR elevation up to 130 when had been in 100-110s yesterday. Hbg today 8.3, goal greater than 7.  No IV fluids running. Patient does have dietician consult and family has been attempting to help patient eat and drink. Per chart patient has refused feeding tube. No fever.  Patient with no apparently distress. Alert and can answer questions. Reports she feels "alright". Only complaint is that she feels worried which was exacerbated by Rapid Response RN asking questions and discussing care with patient and daughter at bedside. Respirations unlabored and not elevated. Oxygen saturations 96% on room air. Lungs sounds clear and diminished. Patient does have congested cough.   Interventions:  Explained potential interactions of patient's chronic conditions with COVID infection, for example patient could have even lower appetite than normal due to illness. Explained that patient could be dehydrated in her vascular system leading to elevated heart rate but with low albumin, weeping, and CHF correcting any potential dehydration is complicated and something to doctor would have to assess.  Plan of Care:  Patient to remain on 1C for now. 1C staff to get 12 lead EKG to confirm patient in SR. Dr. Billie Ruddy to review orders and change or add as needed.  Event Summary:   MD Notified: Dr. Billie Ruddy Call Time: 11:38 Arrival Time: 11:40 End Time: 12:35  Kysean Sweet, Jaynie Bream, RN

## 2021-10-30 NOTE — Progress Notes (Signed)
Follow up with COVID patient after Rapid Response. ICU staff and unit nurse informed me of a lot of issues surrounding this patient. I will follow up as advised by staff to have conversation with daughter and patient regarding goals of care.

## 2021-10-30 NOTE — Progress Notes (Signed)
Rapid Response called for red MEWS; patient in NAD. BP soft with MAP below 60, HR steadily in 120s/130s. [name], ICU nurse responded as well as the chaplain and respiratory team.   Dr. Billie Ruddy communicated with the team via secure chat. This RN requested a [place] conversation or for the patient to be transferred to higher level of care if she is going to remain a full code. Palliative NP [name] suggests they are not consulted at this time. Dr. Billie Ruddy states goals of care conversations have been had with the family and the family wishes the patient to remain full code/full scope of care at this time. Per Dr. Billie Ruddy, as long as patient is not in afib, she does not need monitored on telemetry. EKG order placed and completed. Patient is in sinus tach according to EKG. Telemetry monitoring not reordered at this time. Per Dr. Billie Ruddy, patient to remain on med-surg unit at this time.    10/30/21 1135  Assess: MEWS Score  Temp 98.5 F (36.9 C)  BP (!) 93/43  Pulse Rate (!) 130  Resp 19  Level of Consciousness Alert  SpO2 96 %  O2 Device Room Air  Assess: MEWS Score  MEWS Temp 0  MEWS Systolic 1  MEWS Pulse 3  MEWS RR 0  MEWS LOC 0  MEWS Score 4  MEWS Score Color Red  Assess: if the MEWS score is Yellow or Red  Were vital signs taken at a resting state? Yes  Focused Assessment No change from prior assessment  Does the patient meet 2 or more of the SIRS criteria? Yes  Does the patient have a confirmed or suspected source of infection? Yes  Provider and Rapid Response Notified? Yes  MEWS guidelines implemented *See Row Information* Yes  Treat  MEWS Interventions Escalated (See documentation below)  Pain Scale 0-10  Pain Score 0  Take Vital Signs  Increase Vital Sign Frequency  Red: Q 1hr X 4 then Q 4hr X 4, if remains red, continue Q 4hrs  Escalate  MEWS: Escalate Red: discuss with charge nurse/RN and provider, consider discussing with RRT  Notify: Charge Nurse/RN  Name of Charge Nurse/RN  Notified Jo, RN  Date Charge Nurse/RN Notified 10/30/21  Time Charge Nurse/RN Notified 1138  Notify: Provider  Provider Name/Title Enzo Bi, RN  Date Provider Notified 10/30/21  Time Provider Notified 1145  Notification Type Page  Notification Reason Other (Comment) (rapid called)  Provider response No new orders  Date of Provider Response 10/30/21  Time of Provider Response 0488  Notify: Rapid Response  Name of Rapid Response RN Notified Adalberto Ill RN  Date Rapid Response Notified 10/30/21  Time Rapid Response Notified 1140  Document  Patient Outcome Not stable and remains on department  Progress note created (see row info) Yes  Assess: SIRS CRITERIA  SIRS Temperature  0  SIRS Pulse 1  SIRS Respirations  0  SIRS WBC 1  SIRS Score Sum  2

## 2021-10-30 NOTE — Progress Notes (Addendum)
PROGRESS NOTE    Kayla Long  KGY:[id number] DOB: 10/05/1946 DOA: 10/23/2021 PCP: Pcp, No  122A/122A-AA   Assessment & Plan:   Principal Problem:   COVID-19 virus infection Active Problems:   Lymphedema of both lower extremities   IBS (irritable bowel syndrome)   Severe sepsis (HCC)   Acute metabolic encephalopathy   Anorexia   Acute on chronic anemia   Hypomagnesemia   Hypokalemia   Hypotension   Acute kidney injury superimposed on CKD (HCC)   CKD (chronic kidney disease), stage III (HCC)   Pressure injury of skin of buttock   Chronic diastolic CHF (congestive heart failure) (HCC)   Depression   Protein-calorie malnutrition, severe   75 y.o. female with medical history significant of hypotension on midodrine, severe lymphedema, bedbound status, diastolic CHF, hypertension presented to the ED on morning of 10/23/2021 with reports by family of very poor p.o. intake and worsening confusion for the past 3 days.  EMS was called to the home and found patient's blood pressure 87/40.  Patient had been unable to take her midodrine.  Family reported patient recently having cough, very poor appetite with little p.o. intake but no apparent abdominal pain nausea or vomiting.  Family reported patient's mental status nowhere near baseline.  She is typically conversant, witty and quite interactive but has been more withdrawn.  Exposed to COVID-19 from a family member about a week prior.  Family reported patient had more oozing from her decubitus wounds recently.   Tachycardia, worsening --appeared to be sinus.  Up to 130's today.  Together with hypotension (chronic) triggered red MEWS and rapid call.  WBC elevated and fluctuated since presentation, has not worsened off of abx.  No fever, though pt has been on Tylenol for pain associated with skin breakdown.  No obvious source of bacterial infection currently. --start tele monitoring --d/c tylenol and monitor for fever  COVID-19 virus  infection - POA with sick family contact about one week prior.  No hypoxia or respiratory symptoms. --completed a course of Remdesivir.     Severe sepsis, POA - due to Covid-19. POA with tachycardia, leukocytosis, hypotension,  AKI and encephalopathy consistent with organ dysfunction.  Lactate normal.   Acute metabolic encephalopathy  multifactorial due to Covid-19 infection, dehydration, worsened anemia, renal failure, severe malnutrition --alertness and mental status waxes and weans    Acute kidney injury superimposed on CKD stage IIIa - POA --presented with Cr 1.48, baseline Cr around 1.1.  Given 1L of LR in the ED f/b MIVF.  Cr did not improve with IVF.  AKI likely due to hypotension and intravascular depletion. --nephro consulted, started MIVF on 12/9 and d/c'ed on 12/11. Plan: --oral hydration for now   Acute on chronic anemia 2/2 blood loss - baseline Hbg 7.7 three months ago, presented with Hbg 6.5.  Family report increased blood from wounds recently, so likely acute on chronic blood loss. --s/p 3u pRBC Plan: --monitor Hgb and transfuse to keep Hgb >7   Anorexia / Failure to thrive  Severe hypoalbuminemia  Severe Malnutrition Dysphagia --albumin low around 1.5 since Aug 2022. --noted to have reduced oral intake and difficulty swallowing meat.  SLP eval rec Dys 1 diet. --pt refused tube feed Plan: --upgrade to Dys 2, per pt and family request, for more food otions --supplements per dietician   Chronic Lymphedema of both lower extremities - severe --fluid buildup for the past 2 years, exacerbated by low albumin.  BLE with hardened, cobblestone skin texture, and elephantiasis-appearance.  Skin weeping noted at dependent region. --takes oral lasix at home.  My one attempt at IV diuresis with IV albumin support was not successful. --fluid management per nephrology  Hypokalemia / Hypomagnesemia   Hypophos --monitor and replete PRN --d/c aldactone (added new by  nephrology) due to worsening hypotension   Hypotension, chronic  --Low BP exacerbated by low albumin and fluid 3rd spacing.  Has been 80's-90's without MIVF. --stress dose steroid d/c'ed --MIVF d/c'ed by nephro Plan: --cont midodrine   Chronic diastolic CHF - Echo 01/8181 in Care Everywhere EF >55%, grade II diastolic dysfunction.     IBS (irritable bowel syndrome)  - report as chronic issue. --home Senna-S PRN continued   Pressure injury of skin of buttock  Extensive skin breakdown Family reported recently worsening skin breakdown and bleeding, from exposure to stool and urine and skin weeping.  It takes 5 people to turn pt for cleaning, and also painful for pt. --WOC consulted. Plan: --wound care per order --foley for now  Depression  - continue home Paxil  Morbid obesity, BMI 69.74   DVT prophylaxis: Lovenox SQ Code Status: Full code  Family Communication: daughter updated at the bedside today Level of care: Med-Surg Dispo:   The patient is from: home Anticipated d/c is to: home Anticipated d/c date is: > 3 days Patient currently is not medically ready to d/c due to: AKI, hypotension   Subjective and Interval History:  Pt noted to become more tachycardic, with HR up to 130's, appeared to be sinus.  Rapid called.    Pt had no new complaints.  Denied chest pain, dyspnea.  Pt asked to upgrade her diet.   Objective: Vitals:   10/30/21 0601 10/30/21 0813 10/30/21 1135 10/30/21 1340  BP: (!) 92/48 (!) 96/46 (!) 93/43 (!) 92/40  Pulse: (!) 121 (!) 126 (!) 130 (!) 131  Resp: 20 20 19 19   Temp: 98.5 F (36.9 C) 97.7 F (36.5 C) 98.5 F (36.9 C) 98.6 F (37 C)  TempSrc: Oral Oral Oral   SpO2: 98% 96% 96% 99%  Weight:        Intake/Output Summary (Last 24 hours) at 10/30/2021 1842 Last data filed at 10/30/2021 9937 Gross per 24 hour  Intake --  Output 275 ml  Net -275 ml    Filed Weights   10/23/21 1354  Weight: (!) 196 kg    Examination:    Constitutional: NAD, AAOx3, temporal wasting HEENT: conjunctivae and lids normal, EOMI CV: No cyanosis.   RESP: normal respiratory effort, on RA Extremities: massive thighs and  Psych: depressed mood and affect.    Photos below taken on 10/28/21          Data Reviewed: I have personally reviewed following labs and imaging studies  CBC: Recent Labs  Lab 10/24/21 0645 10/25/21 0612 10/26/21 0507 10/27/21 1696 10/28/21 0514 10/29/21 0558 10/30/21 0842  WBC 11.5* 14.3* 16.3* 17.9* 15.6* 13.2* 12.6*  NEUTROABS 9.1* 13.0*  --   --   --   --   --   HGB 7.5* 6.8* 8.2* 7.4* 6.3* 7.4* 8.3*  HCT 21.5* 19.7* 23.0* 21.3* 18.0* 21.5* 23.8*  MCV 83.0 84.5 83.0 84.5 83.7 81.7 81.8  PLT 178 163 147* 124* 122* 149* 789   Basic Metabolic Panel: Recent Labs  Lab 10/25/21 0612 10/26/21 0507 10/27/21 0632 10/28/21 0514 10/29/21 0558 10/30/21 0842  NA 139 139 142 143 142 138  K 3.5 2.6* 2.8* 3.0* 3.2* 3.6  CL 111 108 110 113*  111 109  CO2 22 23 23 24 23  21*  GLUCOSE 237* 132* 121* 109* 90 99  BUN 20 22 29* 31* 33* 37*  CREATININE 1.58* 1.70* 1.76* 1.78* 1.58* 1.66*  CALCIUM 6.7* 7.2* 7.5* 7.4* 7.6* 7.8*  MG 1.8 1.8 1.8 1.7 1.7 1.7  PHOS 2.4*  --  1.8* 1.9* 2.3* 2.8   GFR: Estimated Creatinine Clearance: 54.3 mL/min (A) (by C-G formula based on SCr of 1.66 mg/dL (H)). Liver Function Tests: Recent Labs  Lab 10/24/21 0509 10/25/21 0612  AST 12* 9*  ALT 6 6  ALKPHOS 109 97  BILITOT 1.0 0.8  PROT 5.5* 5.4*  ALBUMIN <1.5* 1.5*   No results for input(s): LIPASE, AMYLASE in the last 168 hours. No results for input(s): AMMONIA in the last 168 hours. Coagulation Profile: No results for input(s): INR, PROTIME in the last 168 hours. Cardiac Enzymes: No results for input(s): CKTOTAL, CKMB, CKMBINDEX, TROPONINI in the last 168 hours. BNP (last 3 results) No results for input(s): PROBNP in the last 8760 hours. HbA1C: No results for input(s): HGBA1C in the last 72  hours. CBG: Recent Labs  Lab 10/29/21 0848 10/29/21 1727  GLUCAP 78 114*   Lipid Profile: No results for input(s): CHOL, HDL, LDLCALC, TRIG, CHOLHDL, LDLDIRECT in the last 72 hours. Thyroid Function Tests: Recent Labs    10/28/21 0514  FREET4 1.11   Anemia Panel: No results for input(s): VITAMINB12, FOLATE, FERRITIN, TIBC, IRON, RETICCTPCT in the last 72 hours.  Sepsis Labs: Recent Labs  Lab 10/27/21 6144  PROCALCITON 41.57    Recent Results (from the past 240 hour(s))  Culture, blood (routine x 2)     Status: None   Collection Time: 10/23/21  8:27 AM   Specimen: BLOOD  Result Value Ref Range Status   Specimen Description BLOOD LEFT HAND  Final   Special Requests   Final    BOTTLES DRAWN AEROBIC ONLY Blood Culture results may not be optimal due to an inadequate volume of blood received in culture bottles   Culture   Final    NO GROWTH 5 DAYS Performed at [facility], [address] [place], [place] [address]    Report Status 10/28/2021 FINAL  Final  Resp Panel by RT-PCR (Flu A&B, Covid) Nasopharyngeal Swab     Status: Abnormal   Collection Time: 10/23/21 10:07 AM   Specimen: Nasopharyngeal Swab; Nasopharyngeal(NP) swabs in vial transport medium  Result Value Ref Range Status   SARS Coronavirus 2 by RT PCR POSITIVE (A) NEGATIVE Final    Comment: RESULT CALLED TO, READ BACK BY AND VERIFIED WITH: MAGGIE BARBRA 10/23/21 1140 (NOTE) SARS-CoV-2 target nucleic acids are DETECTED.  The SARS-CoV-2 RNA is generally detectable in upper respiratory specimens during the acute phase of infection. Positive results are indicative of the presence of the identified virus, but do not rule out bacterial infection or co-infection with other pathogens not detected by the test. Clinical correlation with patient history and other diagnostic information is necessary to determine patient infection status. The expected result is Negative.  Fact Sheet for  Patients: EntrepreneurPulse.com.au  Fact Sheet for Healthcare Providers: IncredibleEmployment.be  This test is not yet approved or cleared by the [place] FDA and  has been authorized for detection and/or diagnosis of SARS-CoV-2 by FDA under an Emergency Use Authorization (EUA).  This EUA will remain in effect (meaning this test can be used)  for the duration of  the COVID-19 declaration under Section 564(b)(1) of the Act, 21  U.S.C. section 360bbb-3(b)(1), unless the authorization is terminated or revoked sooner.     Influenza A by PCR NEGATIVE NEGATIVE Final   Influenza B by PCR NEGATIVE NEGATIVE Final    Comment: (NOTE) The Xpert Xpress SARS-CoV-2/FLU/RSV plus assay is intended as an aid in the diagnosis of influenza from Nasopharyngeal swab specimens and should not be used as a sole basis for treatment. Nasal washings and aspirates are unacceptable for Xpert Xpress SARS-CoV-2/FLU/RSV testing.  Fact Sheet for Patients: EntrepreneurPulse.com.au  Fact Sheet for Healthcare Providers: IncredibleEmployment.be  This test is not yet approved or cleared by the [place] FDA and has been authorized for detection and/or diagnosis of SARS-CoV-2 by FDA under an Emergency Use Authorization (EUA). This EUA will remain in effect (meaning this test can be used) for the duration of the COVID-19 declaration under Section 564(b)(1) of the Act, 21 U.S.C. section 360bbb-3(b)(1), unless the authorization is terminated or revoked.  Performed at [facility], [address] [place], [place] [address]   Culture, blood (routine x 2)     Status: None   Collection Time: 10/23/21 11:21 AM   Specimen: BLOOD  Result Value Ref Range Status   Specimen Description BLOOD RIGHT ANTECUBITAL  Final   Special Requests   Final    BOTTLES DRAWN AEROBIC AND ANAEROBIC Blood Culture adequate volume   Culture   Final     NO GROWTH 5 DAYS Performed at [facility], [address] [place], [place] [id number]    Report Status 10/28/2021 FINAL  Final      Radiology Studies: No results found.   Scheduled Meds:  (feeding supplement) PROSource Plus  30 mL Oral TID BM   sodium chloride   Intravenous Once   acetaminophen  1,000 mg Oral TID   vitamin C  500 mg Oral Daily   Chlorhexidine Gluconate Cloth  6 each Topical Daily   enoxaparin (LOVENOX) injection  0.5 mg/kg Subcutaneous Q24H   feeding supplement  237 mL Oral TID BM   Ipratropium-Albuterol  1 puff Inhalation Q6H   liver oil-zinc oxide   Topical TID   midodrine  10 mg Oral TID WC   multivitamin with minerals  1 tablet Oral Daily   pantoprazole  40 mg Oral QAC breakfast   PARoxetine  30 mg Oral Daily   sodium chloride flush  10-40 mL Intracatheter Q12H   spironolactone  25 mg Oral Daily   Vitamin D (Ergocalciferol)  50,000 Units Oral Q7 days   zinc sulfate  220 mg Oral Daily   Continuous Infusions:  sodium chloride 5 mL/hr at 10/26/21 1335     LOS: 7 days     Enzo Bi, MD [facility] If 7PM-7AM, please contact night-coverage 10/30/2021, 6:42 PM

## 2021-10-31 ENCOUNTER — Inpatient Hospital Stay: Payer: Medicare HMO

## 2021-10-31 DIAGNOSIS — U071 COVID-19: Secondary | ICD-10-CM | POA: Diagnosis not present

## 2021-10-31 LAB — GLUCOSE, CAPILLARY: Glucose-Capillary: 92 mg/dL (ref 70–99)

## 2021-10-31 LAB — BASIC METABOLIC PANEL
Anion gap: 4 — ABNORMAL LOW (ref 5–15)
BUN: 43 mg/dL — ABNORMAL HIGH (ref 8–23)
CO2: 23 mmol/L (ref 22–32)
Calcium: 7.6 mg/dL — ABNORMAL LOW (ref 8.9–10.3)
Chloride: 110 mmol/L (ref 98–111)
Creatinine, Ser: 1.81 mg/dL — ABNORMAL HIGH (ref 0.44–1.00)
GFR, Estimated: 29 mL/min — ABNORMAL LOW (ref >60)
Glucose, Bld: 99 mg/dL (ref 70–99)
Potassium: 3.8 mmol/L (ref 3.5–5.1)
Sodium: 137 mmol/L (ref 135–145)

## 2021-10-31 LAB — CBC
HCT: 23.6 % — ABNORMAL LOW (ref 36.0–46.0)
Hemoglobin: 8.2 g/dL — ABNORMAL LOW (ref 12.0–15.0)
MCH: 28.8 pg (ref 26.0–34.0)
MCHC: 34.7 g/dL (ref 30.0–36.0)
MCV: 82.8 fL (ref 80.0–100.0)
Platelets: 139 10*3/uL — ABNORMAL LOW (ref 150–400)
RBC: 2.85 MIL/uL — ABNORMAL LOW (ref 3.87–5.11)
RDW: 18.8 % — ABNORMAL HIGH (ref 11.5–15.5)
WBC: 13.1 10*3/uL — ABNORMAL HIGH (ref 4.0–10.5)
nRBC: 0.2 % (ref 0.0–0.2)

## 2021-10-31 LAB — MRSA NEXT GEN BY PCR, NASAL: MRSA by PCR Next Gen: DETECTED — AB

## 2021-10-31 LAB — MAGNESIUM: Magnesium: 1.7 mg/dL (ref 1.7–2.4)

## 2021-10-31 LAB — PHOSPHORUS: Phosphorus: 3.2 mg/dL (ref 2.5–4.6)

## 2021-10-31 MED ORDER — MUPIROCIN 2 % EX OINT
1.0000 "application " | TOPICAL_OINTMENT | Freq: Two times a day (BID) | CUTANEOUS | Status: AC
  Administered 2021-10-31 – 2021-11-04 (×8): 1 via NASAL
  Filled 2021-10-31 (×2): qty 22

## 2021-10-31 MED ORDER — HYDROCOD POLST-CPM POLST ER 10-8 MG/5ML PO SUER
5.0000 mL | Freq: Two times a day (BID) | ORAL | Status: DC | PRN
  Administered 2021-10-31 – 2021-11-03 (×3): 5 mL via ORAL
  Filled 2021-10-31 (×3): qty 5

## 2021-10-31 MED ORDER — CHLORHEXIDINE GLUCONATE CLOTH 2 % EX PADS
6.0000 | MEDICATED_PAD | Freq: Every day | CUTANEOUS | Status: DC
  Administered 2021-11-01: 06:00:00 6 via TOPICAL

## 2021-10-31 MED ORDER — FLUDROCORTISONE ACETATE 0.1 MG PO TABS
0.1000 mg | ORAL_TABLET | Freq: Every day | ORAL | Status: DC
  Administered 2021-10-31 – 2021-11-03 (×4): 0.1 mg via ORAL
  Filled 2021-10-31 (×6): qty 1

## 2021-10-31 MED ORDER — ALBUMIN HUMAN 25 % IV SOLN
12.5000 g | Freq: Two times a day (BID) | INTRAVENOUS | Status: DC
Start: 2021-10-31 — End: 2021-11-03
  Administered 2021-10-31 – 2021-11-03 (×7): 12.5 g via INTRAVENOUS
  Filled 2021-10-31 (×10): qty 50

## 2021-10-31 MED ORDER — HYDROCORTISONE SOD SUC (PF) 100 MG IJ SOLR
100.0000 mg | Freq: Three times a day (TID) | INTRAMUSCULAR | Status: DC
  Administered 2021-10-31 – 2021-11-01 (×2): 100 mg via INTRAVENOUS
  Filled 2021-10-31 (×4): qty 2

## 2021-10-31 NOTE — Plan of Care (Signed)
Patient arrived from 1C with daughter at bedside.  A&O x3 on room air.

## 2021-10-31 NOTE — Significant Event (Signed)
[facility] Cross Coverage Note  Received message from nursing staff that patient has watery diarrhea and needs a flexiseal due to multiple pressure ulcers and moist associated injuries over groin and buttocks.   On review of chart, I asked nursing staff to update temperature  Leukocytosis is stable and remaining vitals are stable.   I discussed with infectious disease and per her review, patient has been having diarrhea since 12/7. ID recommended obtaining an abdominal xray.   Nursing denies that patient endorses specific abdominal pain and that patient has generalized pain that more specific to pressure wound pain.   Diarrhea  - Discussed with nursing staff that c diff pcr order is not indicated at this time. - Flexiseal order has been placed  Dr. Tobie Poet

## 2021-10-31 NOTE — Progress Notes (Signed)
Nutrition Follow-up  DOCUMENTATION CODES:   Severe malnutrition in context of acute illness/injury, Morbid obesity  INTERVENTION:   -Continue Magic cup TID with meals, each supplement provides 290 kcal and 9 grams of protein  -Continue Ensure Enlive po TID, each supplement provides 350 kcal and 20 grams of protein  -Continue MVI with minerals daily -Continue feeding assistance with meals -Continue 30 ml Prosource Plus TID, each supplement provides 100 kcals and 30 grams protein  NUTRITION DIAGNOSIS:   Severe Malnutrition related to acute illness (COVID-19) as evidenced by severe fat depletion, severe muscle depletion, energy intake < or equal to 50% for > or equal to 1 month, edema.  Ongoing  GOAL:   Patient will meet greater than or equal to 90% of their needs  Progressing   MONITOR:   PO intake, Supplement acceptance, Diet advancement, Labs, Weight trends, Skin, I & O's  REASON FOR ASSESSMENT:   Consult Assessment of nutrition requirement/status  ASSESSMENT:   Kayla Long is a 75 y.o. female with medical history significant of hypotension on midodrine, severe lymphedema, bedbound status, diastolic CHF, hypertension presented to the ED on morning of 10/23/2021 with reports by family of very poor p.o. intake and worsening confusion for the past 3 days.  EMS was called to the home and found patient's blood pressure 87/40.  Patient had been unable to take her midodrine.  Family reported patient recently having cough, very poor appetite with little p.o. intake but no apparent abdominal pain nausea or vomiting.  Family reported patient's mental status nowhere near baseline.  She is typically conversant, witty and quite interactive but has been more withdrawn.  Exposed to COVID-19  12/12- advanced to dysphagia 2 diet per family request  Reviewed I/O's: -375 ml x 24 hours and +3.1 L since admission  UOP: 375 ml x 24 hours  Per chart review, pt continues to be encouraged to  eat by staff and family members. Noted pt is more accepting of supplements per MAR. No meal completions documented in chart.   Family continues to desire full scope of care. They have refused PEG and NGT. RD will continue to maximize supplements to optimize nutritional status.    Medications reviewed and include vitamin C, solu-cortef, vitamin D, zinc sulfate, and albumin.   Labs reviewed: CBGS: 78-114 (inpatient orders for glycemic control are none).    Diet Order:   Diet Order             DIET DYS 2 Room service appropriate? Yes; Fluid consistency: Thin  Diet effective now                   EDUCATION NEEDS:   Education needs have been addressed  Skin:  Skin Assessment: Skin Integrity Issues: Skin Integrity Issues:: Other (Comment), Stage II Stage II: sacrum, lt/lt buttocks Other: MASD to buttocks, sacrum, inner upper thighs, posterior upper thighs; lt toe laceration  Last BM:  10/30/21 (type 7)  Height:   Ht Readings from Last 1 Encounters:  07/01/21 5\' 6"  (1.676 m)    Weight:   Wt Readings from Last 1 Encounters:  10/23/21 (!) 196 kg    Ideal Body Weight:  59.1 kg  BMI:  Body mass index is 69.74 kg/m.  Estimated Nutritional Needs:   Kcal:  2150-2350  Protein:  125-150 grams  Fluid:  > 2 L    Loistine Chance, RD, LDN, [place] Registered Dietitian II Certified Diabetes Care and Education Specialist Please refer to [facility] for RD  and/or RD on-call/weekend/after hours pager

## 2021-10-31 NOTE — Progress Notes (Addendum)
PROGRESS NOTE    [name]  UKG:[id number] DOB: 05-Oct-1946 DOA: 10/23/2021 PCP: Pcp, No  [id number]/[id number]   Assessment & Plan:   Principal Problem:   COVID-19 virus infection Active Problems:   Lymphedema of both lower extremities   IBS (irritable bowel syndrome)   Severe sepsis (HCC)   Acute metabolic encephalopathy   Anorexia   Acute on chronic anemia   Hypomagnesemia   Hypokalemia   Hypotension   Acute kidney injury superimposed on CKD ([place])   CKD (chronic kidney disease), stage III (HCC)   Pressure injury of skin of buttock   Chronic diastolic CHF (congestive heart failure) ([place])   Depression   Protein-calorie malnutrition, severe   Kayla Long is a 75 y.o. female with medical history significant of hypotension on midodrine, severe lymphedema, bedbound status, diastolic CHF, hypertension presented to the ED on morning of 10/23/2021 with reports by family of very poor p.o. intake and worsening confusion for the past 3 days.  EMS was called to the home and found patient's blood pressure 87/40.  Patient had been unable to take her midodrine.  Family reported patient recently having cough, very poor appetite with little p.o. intake but no apparent abdominal pain nausea or vomiting.  Family reported patient's mental status nowhere near baseline.  She is typically conversant, witty and quite interactive but has been more withdrawn.  Exposed to COVID-19 from a family member about a week prior.  Family reported patient had more oozing from her decubitus wounds recently.   Tachycardia Red MEWS --appeared to be sinus, likely due to demand (intravascularly depleted, severe anemia, etc).  Together with hypotension (chronic) triggered red MEWS and rapid call.  WBC elevated and fluctuated since presentation, has not worsened off of abx.  No fever, though pt has been on Tylenol for pain associated with skin breakdown.  No obvious source of bacterial infection  currently. Plan: --hold tylenol and monitor for fever --Hold abx for now since no source of infection --transfer to stepdown today --PCCM consult today, discussed with Dr. Mortimer Fries  Hypotension, chronic  --Low BP exacerbated by low albumin and fluid 3rd spacing.  Has been 80's-90's on max dose of midodrine. --received 3 doses of solum-cortef 100 mg shortly after admission, but not continued. --was on MIVF and IV albumin from 12/9 to 12/11 per nephrology, since d/c'ed by nephro. Plan: --cont midodrine 10 mg TID --start Florinef and solu-cortef today, by PCCM --restart IV albumin, by PCCM  Chronic Lymphedema of both lower extremities - severe --fluid buildup for the past 2 years, exacerbated by low albumin.  BLE with hardened, cobblestone skin texture, and elephantiasis-appearance.  Skin weeping noted at dependent region. --takes oral lasix at home.  My one attempt at IV diuresis with IV albumin support was not successful. --fluid management per nephrology  Pressure injury of skin of buttock  Extensive skin breakdown Family reported recently worsening skin breakdown and bleeding, from exposure to stool and urine and skin weeping.  It takes 3-5 people to turn pt for cleaning, and also painful for pt. --WOC consulted.  --Foley placed for skin protection Plan: --wound care per order --d/c Foley today   COVID-19 virus infection - POA with sick family contact about one week prior.  No hypoxia or respiratory symptoms. --completed a course of Remdesivir.     Severe sepsis, POA - due to Covid-19. POA with tachycardia, leukocytosis, hypotension,  AKI and encephalopathy consistent with organ dysfunction.  Lactate normal.   Acute metabolic encephalopathy  multifactorial due to Covid-19 infection, dehydration, worsened anemia, renal failure, severe malnutrition --alertness and mental status waxes and weans    Acute kidney injury superimposed on CKD stage IIIa - POA --presented with Cr 1.48,  baseline Cr around 1.1.  Given 1L of LR in the ED f/b MIVF.  Cr did not improve with IVF.  AKI likely due to hypotension and intravascular depletion. --nephro consulted, started MIVF on 12/9 and d/c'ed on 12/11. Plan: --oral hydration for now   Acute on chronic anemia 2/2 blood loss - baseline Hbg 7.7 three months ago, presented with Hbg 6.5.  Family report increased blood from wounds recently, so likely acute on chronic blood loss. --s/p 3u pRBC Plan: --monitor Hgb and transfuse to keep Hgb >7   Anorexia / Failure to thrive  Severe hypoalbuminemia  Severe Malnutrition Dysphagia --albumin low around 1.5 since Aug 2022. --noted to have reduced oral intake and difficulty swallowing meat.  SLP eval rec Dys 1 diet.  Upgraded to Dys 2 per pt and family request for more food options. --pt refused tube feed Plan: --cont Dys 2 --supplements per dietician  --aspiration precaution.    Hypokalemia / Hypomagnesemia   Hypophos --monitor and replete PRN   Chronic diastolic CHF - Echo 11/3242 in Care Everywhere EF >55%, grade II diastolic dysfunction.     IBS (irritable bowel syndrome)  - report as chronic issue. --home Senna-S PRN continued  Depression  - continue home Paxil  Morbid obesity, BMI 69.74   DVT prophylaxis: Lovenox SQ Code Status: Full code Palliative care involved shortly after presentation, however, daughter wanted Full code and does not want further palliative care involvement currently.  Daughter also does not want goals of care discussion with pt without daughter present.    Family Communication: daughters updated at the bedside today Level of care: Stepdown Dispo:   The patient is from: home Anticipated d/c is to: home Anticipated d/c date is: > 3 days Patient currently is not medically ready to d/c due to: AKI, hypotension   Subjective and Interval History:  Per RN request, pt was transferred to stepdown today for Red MEWS, from tachycardia and  hypotension.  Pt denied new complaints, denied dyspnea or chest pain.  Pt was taking in more fluids, however, in the form of free water.  Daughters at bedside believed pt looked better today.   Objective: Vitals:   10/31/21 0744 10/31/21 1213 10/31/21 1400 10/31/21 1500  BP: (!) 92/44 (!) 88/41 (!) 92/55 (!) 78/49  Pulse: (!) 121 (!) 110 (!) 112 (!) 117  Resp: 20 (!) 34 (!) 31 (!) 21  Temp: (!) 97.4 F (36.3 C) 97.8 F (36.6 C)    TempSrc: Oral Oral    SpO2: 96% 96% 99% 100%  Weight:        Intake/Output Summary (Last 24 hours) at 10/31/2021 1821 Last data filed at 10/31/2021 1459 Gross per 24 hour  Intake 40.25 ml  Output 100 ml  Net -59.75 ml    Filed Weights   10/23/21 1354  Weight: (!) 196 kg    Examination:   Constitutional: NAD, AAOx3 HEENT: conjunctivae and lids normal, EOMI CV: No cyanosis.   RESP: normal respiratory effort, on RA Extremities: massive thighs and lower legs with elephantiasis-appearing and cobblestone skin texture on the anterior side. Neuro: II - XII grossly intact.   Psych: depressed mood and affect.    Photos below taken on 10/28/21          Data Reviewed: I  have personally reviewed following labs and imaging studies  CBC: Recent Labs  Lab 10/25/21 0612 10/26/21 0507 10/27/21 3244 10/28/21 0514 10/29/21 0558 10/30/21 0842 10/31/21 0600  WBC 14.3*   < > 17.9* 15.6* 13.2* 12.6* 13.1*  NEUTROABS 13.0*  --   --   --   --   --   --   HGB 6.8*   < > 7.4* 6.3* 7.4* 8.3* 8.2*  HCT 19.7*   < > 21.3* 18.0* 21.5* 23.8* 23.6*  MCV 84.5   < > 84.5 83.7 81.7 81.8 82.8  PLT 163   < > 124* 122* 149* 160 139*   < > = values in this interval not displayed.   Basic Metabolic Panel: Recent Labs  Lab 10/27/21 0632 10/28/21 0514 10/29/21 0558 10/30/21 0842 10/31/21 0600  NA 142 143 142 138 137  K 2.8* 3.0* 3.2* 3.6 3.8  CL 110 113* 111 109 110  CO2 23 24 23  21* 23  GLUCOSE 121* 109* 90 99 99  BUN 29* 31* 33* 37* 43*   CREATININE 1.76* 1.78* 1.58* 1.66* 1.81*  CALCIUM 7.5* 7.4* 7.6* 7.8* 7.6*  MG 1.8 1.7 1.7 1.7 1.7  PHOS 1.8* 1.9* 2.3* 2.8 3.2   GFR: Estimated Creatinine Clearance: 49.8 mL/min (A) (by C-G formula based on SCr of 1.81 mg/dL (H)). Liver Function Tests: Recent Labs  Lab 10/25/21 0612  AST 9*  ALT 6  ALKPHOS 97  BILITOT 0.8  PROT 5.4*  ALBUMIN 1.5*   No results for input(s): LIPASE, AMYLASE in the last 168 hours. No results for input(s): AMMONIA in the last 168 hours. Coagulation Profile: No results for input(s): INR, PROTIME in the last 168 hours. Cardiac Enzymes: No results for input(s): CKTOTAL, CKMB, CKMBINDEX, TROPONINI in the last 168 hours. BNP (last 3 results) No results for input(s): PROBNP in the last 8760 hours. HbA1C: No results for input(s): HGBA1C in the last 72 hours. CBG: Recent Labs  Lab 10/29/21 0848 10/29/21 1727 10/31/21 1212  GLUCAP 78 114* 92   Lipid Profile: No results for input(s): CHOL, HDL, LDLCALC, TRIG, CHOLHDL, LDLDIRECT in the last 72 hours. Thyroid Function Tests: No results for input(s): TSH, T4TOTAL, FREET4, T3FREE, THYROIDAB in the last 72 hours.  Anemia Panel: No results for input(s): VITAMINB12, FOLATE, FERRITIN, TIBC, IRON, RETICCTPCT in the last 72 hours.  Sepsis Labs: Recent Labs  Lab 10/27/21 0102  PROCALCITON 41.57    Recent Results (from the past 240 hour(s))  Culture, blood (routine x 2)     Status: None   Collection Time: 10/23/21  8:27 AM   Specimen: BLOOD  Result Value Ref Range Status   Specimen Description BLOOD LEFT HAND  Final   Special Requests   Final    BOTTLES DRAWN AEROBIC ONLY Blood Culture results may not be optimal due to an inadequate volume of blood received in culture bottles   Culture   Final    NO GROWTH 5 DAYS Performed at [facility], [address] [place], [place] [address]    Report Status 10/28/2021 FINAL  Final  Resp Panel by RT-PCR (Flu A&B, Covid) Nasopharyngeal Swab      Status: Abnormal   Collection Time: 10/23/21 10:07 AM   Specimen: Nasopharyngeal Swab; Nasopharyngeal(NP) swabs in vial transport medium  Result Value Ref Range Status   SARS Coronavirus 2 by RT PCR POSITIVE (A) NEGATIVE Final    Comment: RESULT CALLED TO, READ BACK BY AND VERIFIED WITH: MAGGIE BARBRA 10/23/21 1140 (NOTE)  SARS-CoV-2 target nucleic acids are DETECTED.  The SARS-CoV-2 RNA is generally detectable in upper respiratory specimens during the acute phase of infection. Positive results are indicative of the presence of the identified virus, but do not rule out bacterial infection or co-infection with other pathogens not detected by the test. Clinical correlation with patient history and other diagnostic information is necessary to determine patient infection status. The expected result is Negative.  Fact Sheet for Patients: EntrepreneurPulse.com.au  Fact Sheet for Healthcare Providers: IncredibleEmployment.be  This test is not yet approved or cleared by the [place] FDA and  has been authorized for detection and/or diagnosis of SARS-CoV-2 by FDA under an Emergency Use Authorization (EUA).  This EUA will remain in effect (meaning this test can be used)  for the duration of  the COVID-19 declaration under Section 564(b)(1) of the Act, 21 U.S.C. section 360bbb-3(b)(1), unless the authorization is terminated or revoked sooner.     Influenza A by PCR NEGATIVE NEGATIVE Final   Influenza B by PCR NEGATIVE NEGATIVE Final    Comment: (NOTE) The Xpert Xpress SARS-CoV-2/FLU/RSV plus assay is intended as an aid in the diagnosis of influenza from Nasopharyngeal swab specimens and should not be used as a sole basis for treatment. Nasal washings and aspirates are unacceptable for Xpert Xpress SARS-CoV-2/FLU/RSV testing.  Fact Sheet for Patients: EntrepreneurPulse.com.au  Fact Sheet for Healthcare  Providers: IncredibleEmployment.be  This test is not yet approved or cleared by the [place] FDA and has been authorized for detection and/or diagnosis of SARS-CoV-2 by FDA under an Emergency Use Authorization (EUA). This EUA will remain in effect (meaning this test can be used) for the duration of the COVID-19 declaration under Section 564(b)(1) of the Act, 21 U.S.C. section 360bbb-3(b)(1), unless the authorization is terminated or revoked.  Performed at [facility], [address] [place], [place] [address]   Culture, blood (routine x 2)     Status: None   Collection Time: 10/23/21 11:21 AM   Specimen: BLOOD  Result Value Ref Range Status   Specimen Description BLOOD RIGHT ANTECUBITAL  Final   Special Requests   Final    BOTTLES DRAWN AEROBIC AND ANAEROBIC Blood Culture adequate volume   Culture   Final    NO GROWTH 5 DAYS Performed at [facility], [address], [place], [place] [address]    Report Status 10/28/2021 FINAL  Final  MRSA Next Gen by PCR, Nasal     Status: Abnormal   Collection Time: 10/31/21 12:15 PM   Specimen: Nasal Mucosa; Nasal Swab  Result Value Ref Range Status   MRSA by PCR Next Gen DETECTED (A) NOT DETECTED Final    Comment: RESULT CALLED TO, READ BACK BY AND VERIFIED WITH: BROOKE DARNEL 10/31/21 1357 MU (NOTE) The GeneXpert MRSA Assay (FDA approved for NASAL specimens only), is one component of a comprehensive MRSA colonization surveillance program. It is not intended to diagnose MRSA infection nor to guide or monitor treatment for MRSA infections. Test performance is not FDA approved in patients less than 56 years old. Performed at [facility], [address], [place], [place] [address]       Radiology Studies: No results found.   Scheduled Meds:  (feeding supplement) PROSource Plus  30 mL Oral TID BM   sodium chloride   Intravenous Once   vitamin C  500 mg Oral Daily    Chlorhexidine Gluconate Cloth  6 each Topical Daily   [START ON 11/01/2021] Chlorhexidine Gluconate Cloth  6 each Topical Q0600   enoxaparin (LOVENOX) injection  0.5 mg/kg Subcutaneous Q24H   feeding supplement  237 mL Oral TID BM   fludrocortisone  0.1 mg Oral Daily   hydrocortisone sod succinate (SOLU-CORTEF) inj  100 mg Intravenous Q8H   Ipratropium-Albuterol  1 puff Inhalation Q6H   liver oil-zinc oxide   Topical TID   midodrine  10 mg Oral TID WC   multivitamin with minerals  1 tablet Oral Daily   mupirocin ointment  1 application Nasal BID   pantoprazole  40 mg Oral QAC breakfast   PARoxetine  30 mg Oral Daily   sodium chloride flush  10-40 mL Intracatheter Q12H   Vitamin D (Ergocalciferol)  50,000 Units Oral Q7 days   zinc sulfate  220 mg Oral Daily   Continuous Infusions:  sodium chloride 5 mL/hr at 10/26/21 1335   albumin human Stopped (10/31/21 1306)     LOS: 8 days     Enzo Bi, MD [facility] If 7PM-7AM, please contact night-coverage 10/31/2021, 6:21 PM

## 2021-10-31 NOTE — Progress Notes (Signed)
Notified Dr. Tobie Poet of multiple bowel movements on day shift continuing into night shift . After this last BM she has had a total of 5 loose stools. Loose, watery and continues to ooze from rectum. She has multiple MASD wounds on coccyx and hip areas.  Notified Dr. Tobie Poet on call.  New order for flexiseal.

## 2021-10-31 NOTE — Progress Notes (Signed)
Patient arrived from 1C via bariatric bed with daughter at bedside.  No acute distress noted.  Placed patient on monitor and educated on use of call bell and TV remote.

## 2021-11-01 LAB — GASTROINTESTINAL PANEL BY PCR, STOOL (REPLACES STOOL CULTURE)

## 2021-11-01 LAB — CBC
HCT: 24.6 % — ABNORMAL LOW (ref 36.0–46.0)
Hemoglobin: 8.2 g/dL — ABNORMAL LOW (ref 12.0–15.0)
MCH: 27.8 pg (ref 26.0–34.0)
MCHC: 33.3 g/dL (ref 30.0–36.0)
MCV: 83.4 fL (ref 80.0–100.0)
Platelets: 149 10*3/uL — ABNORMAL LOW (ref 150–400)
RBC: 2.95 MIL/uL — ABNORMAL LOW (ref 3.87–5.11)
RDW: 20 % — ABNORMAL HIGH (ref 11.5–15.5)
WBC: 10.9 10*3/uL — ABNORMAL HIGH (ref 4.0–10.5)
nRBC: 0.2 % (ref 0.0–0.2)

## 2021-11-01 LAB — BASIC METABOLIC PANEL
Anion gap: 6 (ref 5–15)
BUN: 45 mg/dL — ABNORMAL HIGH (ref 8–23)
CO2: 23 mmol/L (ref 22–32)
Calcium: 8 mg/dL — ABNORMAL LOW (ref 8.9–10.3)
Chloride: 110 mmol/L (ref 98–111)
Creatinine, Ser: 1.76 mg/dL — ABNORMAL HIGH (ref 0.44–1.00)
GFR, Estimated: 30 mL/min — ABNORMAL LOW (ref >60)
Glucose, Bld: 143 mg/dL — ABNORMAL HIGH (ref 70–99)
Potassium: 3.8 mmol/L (ref 3.5–5.1)
Sodium: 139 mmol/L (ref 135–145)

## 2021-11-01 LAB — MAGNESIUM: Magnesium: 2 mg/dL (ref 1.7–2.4)

## 2021-11-01 MED ORDER — ASCORBIC ACID 500 MG PO TABS
500.0000 mg | ORAL_TABLET | Freq: Two times a day (BID) | ORAL | Status: DC
  Administered 2021-11-01 – 2021-11-06 (×10): 500 mg via ORAL
  Filled 2021-11-01 (×10): qty 1

## 2021-11-01 MED ORDER — LOPERAMIDE HCL 2 MG PO CAPS
2.0000 mg | ORAL_CAPSULE | ORAL | Status: DC | PRN
  Administered 2021-11-02: 2 mg via ORAL
  Filled 2021-11-01: qty 1

## 2021-11-01 MED ORDER — PANTOPRAZOLE SODIUM 40 MG IV SOLR
40.0000 mg | Freq: Every day | INTRAVENOUS | Status: DC
  Administered 2021-11-01 – 2021-11-05 (×5): 40 mg via INTRAVENOUS
  Filled 2021-11-01 (×5): qty 40

## 2021-11-01 MED ORDER — HYDROCORTISONE SOD SUC (PF) 100 MG IJ SOLR
100.0000 mg | Freq: Two times a day (BID) | INTRAMUSCULAR | Status: DC
Start: 2021-11-01 — End: 2021-11-02
  Administered 2021-11-01 (×2): 100 mg via INTRAVENOUS
  Filled 2021-11-01 (×2): qty 2

## 2021-11-01 MED ORDER — ACETAMINOPHEN 325 MG PO TABS
650.0000 mg | ORAL_TABLET | Freq: Four times a day (QID) | ORAL | Status: DC | PRN
  Administered 2021-11-01 – 2021-11-04 (×3): 650 mg via ORAL
  Filled 2021-11-01 (×3): qty 2

## 2021-11-01 NOTE — Consult Note (Signed)
[facility] Nurse Consult Note: Reason for Consult: re-consulted for RN concern, new areas and patient's CG concerned Patient was fully evaluated by my [place] partner on 10/25/21, this admission Wound type: MASD (moisture associated skin damage)over the bilateral buttocks; discussed current POC established by previous [place] nurse.  Updated for better control of weeping/bleeding wounds 2. Trauma (left 5th toe), foam dressing to protect and insulate 3. Right groin wound; unclear etiology, full thickness.  Patient's daughter at the bedside and said it started and was superficial about 3 weeks ago at home.  Has gotten deeper over time; pink, moist, no odor; 4cm x 2cm x 1.5cm  4.  Full thickness wound left  thigh; clean, pink, but draining copious amounts related to severe lymphedema that affects this patient up to the mid abdomen  5. Chronic skin changes bilateral LE; lymphedema; wound need outpatient treatment to manage long term based on [place].   Pressure Injury POA: NA Measurement: see nursing flow sheet; discussed all wounds with bedside nursing staff.  Assessment of new area, but not actually new POC in the right groin  Wound bed:see above  Drainage (amount, consistency, odor) all of the drainage seems to be serosanguinous  Periwound: intact Patient has significant skin folds and potential for skin breakdown due to incontinence, she now has external urinary management system in place and FMS in place. Hoping this will improve area affected from long term exposure to moisture. She is on a bariatric low air loss mattress as well for moisture management and pressure redistribution  Dressing procedure/placement/frequency: Add silver hydrofiber packing to the right groin Continue calcium alginate and foam, bedside nurses began and I agree with this plan to address moisture and weeping Add hydroconductive dressing (Drawtex) to the thigh wound for exudate management Continue silicone foam to the left toe wound LALM  bari bed FMS and Purwick for bowl and bladder incontinence  Zinc moisture barrier cream to the bilateral buttocks that are not weeping/bleeding Discussed POC with patient and bedside nurse.  Re consult if needed, will not follow at this time. Thanks  Keylon Labelle [employer], RN,CWOCN, CNS, [place] [phone])

## 2021-11-01 NOTE — TOC Progression Note (Signed)
Transition of Care [facility]) - Progression Note    Patient Details  Name: Kayla Long MRN: [id number] Date of Birth: [birth year]  [facility] of [facility] CM/SW Contact  Shelbie Hutching, RN Phone Number: 11/01/2021, 11:49 AM  Clinical Narrative:    Patient is in ICU stepdown status.  TOC continues to follow.  Palliative was consulted but family is not ready for palliative involvement.     Expected Discharge Plan:  (to be determined) Barriers to Discharge: Continued Medical Work up  Expected Discharge Plan and Services Expected Discharge Plan:  (to be determined)   Discharge Planning Services: CM Consult   Living arrangements for the past 2 months: Single Family Home                 DME Arranged: N/A DME Agency: NA       HH Arranged: NA HH Agency: NA         Social Determinants of Health (SDOH) Interventions    Readmission Risk Interventions Readmission Risk Prevention Plan 10/24/2021  Transportation Screening Complete  PCP or Specialist Appt within 3-5 Days Complete  HRI or Home Care Consult Complete  Social Work Consult for [place] Planning/Counseling Complete  Palliative Care Screening Complete  Medication Review Press photographer) Complete  Some recent data might be hidden

## 2021-11-01 NOTE — Progress Notes (Addendum)
PROGRESS NOTE    Kayla Long  BOF:[id number] DOB: June 14, 1946 DOA: 10/23/2021 PCP: Pcp, No    Brief Narrative:  75 y.o. female with medical history significant of hypotension on midodrine, severe lymphedema, bedbound status, diastolic CHF, hypertension presented to the ED on morning of 10/23/2021 with reports by family of very poor p.o. intake and worsening confusion for the past 3 days.  EMS was called to the home and found patient's blood pressure 87/40.  Patient had been unable to take her midodrine.  Family reported patient recently having cough, very poor appetite with little p.o. intake but no apparent abdominal pain nausea or vomiting.  Family reported patient's mental status nowhere near baseline.  She is typically conversant, witty and quite interactive but has been more withdrawn.  Exposed to COVID-19 from a family member about a week prior.  Family reported patient had more oozing from her decubitus wounds recently.  Patient was transferred to stepdown unit for red mews secondary to tachycardia and hypotension.  Placed on midodrine, Florinef, stress dose steroids.  Blood pressure and heart rate improved.   Assessment & Plan:   Principal Problem:   COVID-19 virus infection Active Problems:   Lymphedema of both lower extremities   IBS (irritable bowel syndrome)   Severe sepsis (HCC)   Acute metabolic encephalopathy   Anorexia   Acute on chronic anemia   Hypomagnesemia   Hypokalemia   Hypotension   Acute kidney injury superimposed on CKD (HCC)   CKD (chronic kidney disease), stage III (HCC)   Pressure injury of skin of buttock   Chronic diastolic CHF (congestive heart failure) (HCC)   Depression   Protein-calorie malnutrition, severe  Sinus tachycardia Acute on chronic hypotension Tachycardia likely secondary to chronic illnesses and intravascular volume depletion No signs of acute infection Patient's p.o. intake has been poor Plan: Continue midodrine 10 3 times  daily per home dose Continue Florinef Taper Solu-Cortef, 50 mg every 6 hours today Continue IV albumin  Chronic lymphedema bilateral lower extremities Acute on chronic wounds Pressure injury to skin of buttock Extensive skin breakdown Per family fluid has been building up last 2 years Worsened with low albumin state BLE with hardened cobblestone texture and elephantiasis appearance Skin weeping noted Plan: Wound care reengaged Aldactone 50 mg p.o. daily No IV fluids Defer loop diuretics for now  COVID-19 infection Severe sepsis secondary to DPOEU-23 Acute metabolic encephalopathy secondary to above Completed course of remdesivir AKI encephalopathy along with tachycardia, leukocytosis, hypotension Lactic acid normal, patient not in shock Sepsis physiology appears to be improving Continue vasopressor support, wean as tolerated Monitor mental status, waxes and wanes  Acute kidney injury on chronic kidney disease stage IIIa Creatinine on presentation 1.48 Baseline 1.1 IVF discontinued Oral hydration and IV albumin for now  Acute on chronic anemia 2/2 blood loss - baseline Hbg 7.7 three months ago, presented with Hbg 6.5.  Family report increased blood from wounds recently, so likely acute on chronic blood loss. --s/p 3u pRBC Plan: --monitor Hgb and transfuse to keep Hgb >7   Anorexia / Failure to thrive  Severe hypoalbuminemia  Severe Malnutrition Dysphagia --albumin low around 1.5 since Aug 2022. --noted to have reduced oral intake and difficulty swallowing meat.  SLP eval rec Dys 1 diet.   --Upgraded to Dys 2 per pt and family request for more food options. --Unfortunately RN noted that patient continued to have overt aspiration on dysphagia 2 diet.  At this time unsafe to progress diet beyond this point.  Patient remains a full code and given her tenuous state a aspiration event could prove disastrous. --pt refused tube feed Plan: Continue dysphagia 1 diet for  now RD following for dietary supplementation   Hypokalemia / Hypomagnesemia   Hypophos --monitor and replete PRN   Chronic diastolic CHF  Echo 02/6658 in Care Everywhere EF >55%, grade II diastolic dysfunction.     IBS (irritable bowel syndrome)  - report as chronic issue. --home Senna-S PRN continued   Depression  - continue home Paxil   Morbid obesity, BMI 69.74   DVT prophylaxis: SQ Lovenox Code Status: Full code Family Communication:Daughter [name] at bedside 12/14 Disposition Plan: Status is: Inpatient  Remains inpatient appropriate because: Remains hypotensive, currently requiring stepdown level of care       Level of care: Stepdown  Consultants:  None.  Nephrology and palliative care were following.  No signs of  Procedures:  None  Antimicrobials: None   Subjective: Seen and examined.  Voice weak and patient frail but answers questions appropriately and endorses that she is feeling a little better  Objective: Vitals:   11/01/21 0400 11/01/21 0500 11/01/21 0600 11/01/21 0900  BP: (!) 93/47 (!) 92/44 (!) 107/55 (!) 104/57  Pulse: 95 96 100 (!) 101  Resp: (!) 26 20 (!) 25 (!) 22  Temp:  (!) 97.4 F (36.3 C)  98.2 F (36.8 C)  TempSrc:  Axillary  Oral  SpO2: (!) 87% 97% 100% 99%  Weight:        Intake/Output Summary (Last 24 hours) at 11/01/2021 1313 Last data filed at 11/01/2021 1155 Gross per 24 hour  Intake 117.12 ml  Output 350 ml  Net -232.88 ml   Filed Weights   10/23/21 1354  Weight: (!) 196 kg    Examination:  General exam: Lying in bed.  Appears chronically ill and frail. Respiratory system: Poor respiratory effort.  Lungs clear.  Normal work of breathing.  Room air Cardiovascular system: S1-S2, RRR, no murmurs, marked nonpitting edema BLE Gastrointestinal system: Obese, NT/ND, normal bowel sounds Central nervous system: Alert and oriented. No focal neurological deficits. Extremities: Marked BLE nonpitting edema,  elephantiasis appearing, marked skin breakdown Skin: No rashes, lesions or ulcers Psychiatry: Judgement and insight appear impaired. Mood & affect flattened.     Data Reviewed: I have personally reviewed following labs and imaging studies  CBC: Recent Labs  Lab 10/28/21 0514 10/29/21 0558 10/30/21 0842 10/31/21 0600 11/01/21 0239  WBC 15.6* 13.2* 12.6* 13.1* 10.9*  HGB 6.3* 7.4* 8.3* 8.2* 8.2*  HCT 18.0* 21.5* 23.8* 23.6* 24.6*  MCV 83.7 81.7 81.8 82.8 83.4  PLT 122* 149* 160 139* 935*   Basic Metabolic Panel: Recent Labs  Lab 10/27/21 0632 10/28/21 0514 10/29/21 0558 10/30/21 0842 10/31/21 0600 11/01/21 0239  NA 142 143 142 138 137 139  K 2.8* 3.0* 3.2* 3.6 3.8 3.8  CL 110 113* 111 109 110 110  CO2 23 24 23  21* 23 23  GLUCOSE 121* 109* 90 99 99 143*  BUN 29* 31* 33* 37* 43* 45*  CREATININE 1.76* 1.78* 1.58* 1.66* 1.81* 1.76*  CALCIUM 7.5* 7.4* 7.6* 7.8* 7.6* 8.0*  MG 1.8 1.7 1.7 1.7 1.7 2.0  PHOS 1.8* 1.9* 2.3* 2.8 3.2  --    GFR: Estimated Creatinine Clearance: 51.2 mL/min (A) (by C-G formula based on SCr of 1.76 mg/dL (H)). Liver Function Tests: No results for input(s): AST, ALT, ALKPHOS, BILITOT, PROT, ALBUMIN in the last 168 hours. No results for input(s): LIPASE, AMYLASE  in the last 168 hours. No results for input(s): AMMONIA in the last 168 hours. Coagulation Profile: No results for input(s): INR, PROTIME in the last 168 hours. Cardiac Enzymes: No results for input(s): CKTOTAL, CKMB, CKMBINDEX, TROPONINI in the last 168 hours. BNP (last 3 results) No results for input(s): PROBNP in the last 8760 hours. HbA1C: No results for input(s): HGBA1C in the last 72 hours. CBG: Recent Labs  Lab 10/29/21 0848 10/29/21 1727 10/31/21 1212  GLUCAP 78 114* 92   Lipid Profile: No results for input(s): CHOL, HDL, LDLCALC, TRIG, CHOLHDL, LDLDIRECT in the last 72 hours. Thyroid Function Tests: No results for input(s): TSH, T4TOTAL, FREET4, T3FREE, THYROIDAB in  the last 72 hours. Anemia Panel: No results for input(s): VITAMINB12, FOLATE, FERRITIN, TIBC, IRON, RETICCTPCT in the last 72 hours. Sepsis Labs: Recent Labs  Lab 10/27/21 4098  PROCALCITON 41.57    Recent Results (from the past 240 hour(s))  Culture, blood (routine x 2)     Status: None   Collection Time: 10/23/21  8:27 AM   Specimen: BLOOD  Result Value Ref Range Status   Specimen Description BLOOD LEFT HAND  Final   Special Requests   Final    BOTTLES DRAWN AEROBIC ONLY Blood Culture results may not be optimal due to an inadequate volume of blood received in culture bottles   Culture   Final    NO GROWTH 5 DAYS Performed at [facility], [address] [place], [place] [address]    Report Status 10/28/2021 FINAL  Final  Resp Panel by RT-PCR (Flu A&B, Covid) Nasopharyngeal Swab     Status: Abnormal   Collection Time: 10/23/21 10:07 AM   Specimen: Nasopharyngeal Swab; Nasopharyngeal(NP) swabs in vial transport medium  Result Value Ref Range Status   SARS Coronavirus 2 by RT PCR POSITIVE (A) NEGATIVE Final    Comment: RESULT CALLED TO, READ BACK BY AND VERIFIED WITH: MAGGIE BARBRA 10/23/21 1140 (NOTE) SARS-CoV-2 target nucleic acids are DETECTED.  The SARS-CoV-2 RNA is generally detectable in upper respiratory specimens during the acute phase of infection. Positive results are indicative of the presence of the identified virus, but do not rule out bacterial infection or co-infection with other pathogens not detected by the test. Clinical correlation with patient history and other diagnostic information is necessary to determine patient infection status. The expected result is Negative.  Fact Sheet for Patients: EntrepreneurPulse.com.au  Fact Sheet for Healthcare Providers: IncredibleEmployment.be  This test is not yet approved or cleared by the [place] FDA and  has been authorized for detection and/or diagnosis of  SARS-CoV-2 by FDA under an Emergency Use Authorization (EUA).  This EUA will remain in effect (meaning this test can be used)  for the duration of  the COVID-19 declaration under Section 564(b)(1) of the Act, 21 U.S.C. section 360bbb-3(b)(1), unless the authorization is terminated or revoked sooner.     Influenza A by PCR NEGATIVE NEGATIVE Final   Influenza B by PCR NEGATIVE NEGATIVE Final    Comment: (NOTE) The Xpert Xpress SARS-CoV-2/FLU/RSV plus assay is intended as an aid in the diagnosis of influenza from Nasopharyngeal swab specimens and should not be used as a sole basis for treatment. Nasal washings and aspirates are unacceptable for Xpert Xpress SARS-CoV-2/FLU/RSV testing.  Fact Sheet for Patients: EntrepreneurPulse.com.au  Fact Sheet for Healthcare Providers: IncredibleEmployment.be  This test is not yet approved or cleared by the [place] FDA and has been authorized for detection and/or diagnosis of SARS-CoV-2 by FDA under  an Emergency Use Authorization (EUA). This EUA will remain in effect (meaning this test can be used) for the duration of the COVID-19 declaration under Section 564(b)(1) of the Act, 21 U.S.C. section 360bbb-3(b)(1), unless the authorization is terminated or revoked.  Performed at [facility], [address] [place], [place] [address]   Culture, blood (routine x 2)     Status: None   Collection Time: 10/23/21 11:21 AM   Specimen: BLOOD  Result Value Ref Range Status   Specimen Description BLOOD RIGHT ANTECUBITAL  Final   Special Requests   Final    BOTTLES DRAWN AEROBIC AND ANAEROBIC Blood Culture adequate volume   Culture   Final    NO GROWTH 5 DAYS Performed at [facility], [address] [place], [place] [address]    Report Status 10/28/2021 FINAL  Final  Culture, blood (Routine X 2) w Reflex to ID Panel     Status: None (Preliminary result)   Collection Time: 10/31/21  9:30  AM   Specimen: BLOOD LEFT HAND  Result Value Ref Range Status   Specimen Description BLOOD LEFT HAND  Final   Special Requests   Final    BOTTLES DRAWN AEROBIC AND ANAEROBIC Blood Culture adequate volume   Culture   Final    NO GROWTH < 24 HOURS Performed at [facility] [address] [place], [place] [address]    Report Status PENDING  Incomplete  Culture, blood (Routine X 2) w Reflex to ID Panel     Status: None (Preliminary result)   Collection Time: 10/31/21  9:30 AM   Specimen: BLOOD LEFT WRIST  Result Value Ref Range Status   Specimen Description BLOOD LEFT WRIST  Final   Special Requests   Final    BOTTLES DRAWN AEROBIC AND ANAEROBIC Blood Culture adequate volume   Culture   Final    NO GROWTH < 24 HOURS Performed at [facility], [address] [place], [place] [address]    Report Status PENDING  Incomplete  MRSA Next Gen by PCR, Nasal     Status: Abnormal   Collection Time: 10/31/21 12:15 PM   Specimen: Nasal Mucosa; Nasal Swab  Result Value Ref Range Status   MRSA by PCR Next Gen DETECTED (A) NOT DETECTED Final    Comment: RESULT CALLED TO, READ BACK BY AND VERIFIED WITH: BROOKE DARNEL 10/31/21 1357 MU (NOTE) The GeneXpert MRSA Assay (FDA approved for NASAL specimens only), is one component of a comprehensive MRSA colonization surveillance program. It is not intended to diagnose MRSA infection nor to guide or monitor treatment for MRSA infections. Test performance is not FDA approved in patients less than 15 years old. Performed at [facility], [address] [place], [place] [address]   Gastrointestinal Panel by PCR , Stool     Status: None   Collection Time: 10/31/21 10:05 PM   Specimen: Rectum; Stool  Result Value Ref Range Status   Campylobacter species NOT DETECTED NOT DETECTED Final   Plesimonas shigelloides NOT DETECTED NOT DETECTED Final   Salmonella species NOT DETECTED NOT DETECTED Final   Yersinia enterocolitica  NOT DETECTED NOT DETECTED Final   Vibrio species NOT DETECTED NOT DETECTED Final   Vibrio cholerae NOT DETECTED NOT DETECTED Final   Enteroaggregative E coli (EAEC) NOT DETECTED NOT DETECTED Final   Enteropathogenic E coli (EPEC) NOT DETECTED NOT DETECTED Final   Enterotoxigenic E coli (ETEC) NOT DETECTED NOT DETECTED Final   Shiga like toxin  producing E coli (STEC) NOT DETECTED NOT DETECTED Final   Shigella/Enteroinvasive E coli (EIEC) NOT DETECTED NOT DETECTED Final   Cryptosporidium NOT DETECTED NOT DETECTED Final   Cyclospora cayetanensis NOT DETECTED NOT DETECTED Final   Entamoeba histolytica NOT DETECTED NOT DETECTED Final   Giardia lamblia NOT DETECTED NOT DETECTED Final   Adenovirus F40/41 NOT DETECTED NOT DETECTED Final   Astrovirus NOT DETECTED NOT DETECTED Final   Norovirus GI/GII NOT DETECTED NOT DETECTED Final   Rotavirus A NOT DETECTED NOT DETECTED Final   Sapovirus (I, II, IV, and V) NOT DETECTED NOT DETECTED Final    Comment: Performed at [facility], [address], [place], [place] [address]         Radiology Studies: DG Abd Portable 1V  Result Date: 10/31/2021 CLINICAL DATA:  Diarrhea. EXAM: PORTABLE ABDOMEN - 1 VIEW COMPARISON:  CT dated 08/27/2008. FINDINGS: No bowel dilatation or evidence of obstruction. No free air. Indeterminate 15 mm rounded calcific focus in the left upper abdomen. Degenerative changes of the spine. No acute osseous pathology. IMPRESSION: No bowel dilatation or evidence of obstruction. Electronically Signed   By: Anner Crete M.D.   On: 10/31/2021 23:40        Scheduled Meds:  (feeding supplement) PROSource Plus  30 mL Oral TID BM   sodium chloride   Intravenous Once   vitamin C  500 mg Oral BID   Chlorhexidine Gluconate Cloth  6 each Topical Daily   enoxaparin (LOVENOX) injection  0.5 mg/kg Subcutaneous Q24H   feeding supplement  237 mL Oral TID BM   fludrocortisone  0.1 mg Oral Daily   hydrocortisone sod succinate  (SOLU-CORTEF) inj  100 mg Intravenous BID   Ipratropium-Albuterol  1 puff Inhalation Q6H   liver oil-zinc oxide   Topical TID   midodrine  10 mg Oral TID WC   multivitamin with minerals  1 tablet Oral Daily   mupirocin ointment  1 application Nasal BID   pantoprazole (PROTONIX) IV  40 mg Intravenous Daily   PARoxetine  30 mg Oral Daily   sodium chloride flush  10-40 mL Intracatheter Q12H   Vitamin D (Ergocalciferol)  50,000 Units Oral Q7 days   zinc sulfate  220 mg Oral Daily   Continuous Infusions:  sodium chloride 5 mL/hr at 10/26/21 1335   albumin human Stopped (11/01/21 1045)     LOS: 9 days    Time spent: 35 minutes    Sidney Ace, MD [facility]   If 7PM-7AM, please contact night-coverage  11/01/2021, 1:13 PM

## 2021-11-01 NOTE — Progress Notes (Signed)
Patient is unhappy with pureed diet.  Explained to patient and daughter that patient is aspirating and needs diet to be evaluated.  Speech has recommended on the 9th Dys 1 diet with this liquids.  Discussed this with Dr. Priscella Mann this AM and told him of the issues with swallowing.  Reconsult for speech.  Speech is not going to see patient to reevaluate because they have made recommendations.

## 2021-11-01 NOTE — Progress Notes (Signed)
Cherrie in infection prevention okayed discontination of airborne precautions and MD. Dr. Priscella Mann okay as well.

## 2021-11-01 NOTE — Plan of Care (Signed)

## 2021-11-02 LAB — CBC
HCT: 24.5 % — ABNORMAL LOW (ref 36.0–46.0)
Hemoglobin: 8.4 g/dL — ABNORMAL LOW (ref 12.0–15.0)
MCH: 28.7 pg (ref 26.0–34.0)
MCHC: 34.3 g/dL (ref 30.0–36.0)
MCV: 83.6 fL (ref 80.0–100.0)
Platelets: 162 10*3/uL (ref 150–400)
RBC: 2.93 MIL/uL — ABNORMAL LOW (ref 3.87–5.11)
RDW: 19.4 % — ABNORMAL HIGH (ref 11.5–15.5)
WBC: 15.3 10*3/uL — ABNORMAL HIGH (ref 4.0–10.5)
nRBC: 0.2 % (ref 0.0–0.2)

## 2021-11-02 LAB — BASIC METABOLIC PANEL
Anion gap: 8 (ref 5–15)
BUN: 54 mg/dL — ABNORMAL HIGH (ref 8–23)
CO2: 22 mmol/L (ref 22–32)
Calcium: 8 mg/dL — ABNORMAL LOW (ref 8.9–10.3)
Chloride: 108 mmol/L (ref 98–111)
Creatinine, Ser: 1.81 mg/dL — ABNORMAL HIGH (ref 0.44–1.00)
GFR, Estimated: 29 mL/min — ABNORMAL LOW (ref >60)
Glucose, Bld: 136 mg/dL — ABNORMAL HIGH (ref 70–99)
Potassium: 3.6 mmol/L (ref 3.5–5.1)
Sodium: 138 mmol/L (ref 135–145)

## 2021-11-02 LAB — MAGNESIUM: Magnesium: 1.7 mg/dL (ref 1.7–2.4)

## 2021-11-02 MED ORDER — TROLAMINE SALICYLATE 10 % EX CREA
TOPICAL_CREAM | Freq: Two times a day (BID) | CUTANEOUS | Status: DC | PRN
  Filled 2021-11-02: qty 85

## 2021-11-02 MED ORDER — HYDROCORTISONE SOD SUC (PF) 100 MG IJ SOLR
50.0000 mg | Freq: Three times a day (TID) | INTRAMUSCULAR | Status: DC
  Administered 2021-11-02 – 2021-11-03 (×4): 50 mg via INTRAVENOUS
  Filled 2021-11-02: qty 2
  Filled 2021-11-02: qty 1
  Filled 2021-11-02: qty 2
  Filled 2021-11-02 (×2): qty 1

## 2021-11-02 NOTE — Progress Notes (Signed)
PROGRESS NOTE    Kayla Long  OEU:[id number] DOB: [birth year] DOA: 10/23/2021 PCP: Pcp, No    Brief Narrative:  75 y.o. female with medical history significant of hypotension on midodrine, severe lymphedema, bedbound status, diastolic CHF, hypertension presented to the ED on morning of 10/23/2021 with reports by family of very poor p.o. intake and worsening confusion for the past 3 days.  EMS was called to the home and found patient's blood pressure 87/40.  Patient had been unable to take her midodrine.  Family reported patient recently having cough, very poor appetite with little p.o. intake but no apparent abdominal pain nausea or vomiting.  Family reported patient's mental status nowhere near baseline.  She is typically conversant, witty and quite interactive but has been more withdrawn.  Exposed to COVID-19 from a family member about a week prior.  Family reported patient had more oozing from her decubitus wounds recently.  Patient was transferred to stepdown unit for red mews secondary to tachycardia and hypotension.  Placed on midodrine, Florinef, stress dose steroids.  Blood pressure and heart rate improved.  Patient is remained stable.  Hypotension at baseline.  Stable for transfer back to medical telemetry on 12/15   Assessment & Plan:   Principal Problem:   COVID-19 virus infection Active Problems:   Lymphedema of both lower extremities   IBS (irritable bowel syndrome)   Severe sepsis (HCC)   Acute metabolic encephalopathy   Anorexia   Acute on chronic anemia   Hypomagnesemia   Hypokalemia   Hypotension   Acute kidney injury superimposed on CKD (HCC)   CKD (chronic kidney disease), stage III (HCC)   Pressure injury of skin of buttock   Chronic diastolic CHF (congestive heart failure) (HCC)   Depression   Protein-calorie malnutrition, severe  Sinus tachycardia Acute on chronic hypotension Tachycardia likely secondary to chronic illnesses and intravascular volume  depletion No signs of acute infection Patient's p.o. intake has been poor, though improving Plan: Continue midodrine 10 3 times daily per home dose Continue Florinef 0.1 mg daily Continue tapering Solu-Cortef, 50 mg every 8 hours today Continue IV albumin for oncotic support  Chronic lymphedema bilateral lower extremities Acute on chronic wounds Pressure injury to skin of buttock Extensive skin breakdown Per family fluid has been building up last 2 years Worsened with low albumin state BLE with hardened cobblestone texture and elephantiasis appearance Skin weeping noted Plan: Wound care reengaged Aldactone 50 mg p.o. daily No IV fluids Defer loop diuretics for now, consider on discharge  COVID-19 infection Severe sepsis secondary to [id number] Acute metabolic encephalopathy secondary to above Completed course of remdesivir AKI encephalopathy along with tachycardia, leukocytosis, hypotension Lactic acid normal, patient not in shock Sepsis physiology appears to be improving Continue vasopressor support, wean as tolerated Monitor mental status, waxes and wanes  Acute kidney injury on chronic kidney disease stage IIIa Creatinine on presentation 1.48 Baseline 1.1 IVF discontinued Oral hydration and IV albumin for now  Acute on chronic anemia 2/2 blood loss - baseline Hbg 7.7 three months ago, presented with Hbg 6.5.  Family report increased blood from wounds recently, so likely acute on chronic blood loss. --s/p 3u pRBC Plan: --monitor Hgb and transfuse to keep Hgb >7   Anorexia / Failure to thrive  Severe hypoalbuminemia  Severe Malnutrition Dysphagia --albumin low around 1.5 since Aug 2022. --noted to have reduced oral intake and difficulty swallowing meat.  SLP eval rec Dys 1 diet.   --Upgraded to Dys 2 per pt and  family request for more food options. --Unfortunately RN noted that patient continued to have overt aspiration on dysphagia 2 diet.  At this time unsafe to  progress diet beyond this point.  Patient remains a full code and given her tenuous state a aspiration event could prove disastrous. --pt refused tube feed Plan: Continue dysphagia 1 diet for now RD following for dietary supplementation   Hypokalemia / Hypomagnesemia   Hypophos --monitor and replete PRN   Chronic diastolic CHF  Echo 01/3353 in Care Everywhere EF >55%, grade II diastolic dysfunction.     IBS (irritable bowel syndrome)  - report as chronic issue. --home Senna-S PRN continued   Depression  - continue home Paxil   Morbid obesity, BMI 69.74   DVT prophylaxis: SQ Lovenox Code Status: Full code Family Communication:Daughter [name] at bedside 12/14 Disposition Plan: Status is: Inpatient  Remains inpatient appropriate because: Hypotension improving.  Blood pressure likely at baseline.  Stable for transfer to telemetry medical       Level of care: Telemetry Medical  Consultants:  None.  Nephrology and palliative care were following.  Now signed off  Procedures:  None  Antimicrobials: None   Subjective: Seen and examined.  Voice much stronger.  Seems to be eating more with assistance  Objective: Vitals:   11/02/21 0200 11/02/21 0300 11/02/21 0344 11/02/21 0900  BP: (!) 102/56   94/75  Pulse: (!) 103 100  (!) 105  Resp: (!) 21 (!) 23  15  Temp:  (!) 97.3 F (36.3 C)  (!) 96.6 F (35.9 C)  TempSrc:    Oral  SpO2: 98% 95%  99%  Weight:   (!) 194 kg     Intake/Output Summary (Last 24 hours) at 11/02/2021 1452 Last data filed at 11/02/2021 1100 Gross per 24 hour  Intake 984.58 ml  Output 300 ml  Net 684.58 ml   Filed Weights   10/23/21 1354 11/02/21 0344  Weight: (!) 196 kg (!) 194 kg    Examination:  General exam: No acute distress.  Appears chronically ill Respiratory system: Poor respiratory effort.  Lungs clear.  Normal work of breathing.  Room air Cardiovascular system: S1-S2, RRR, no murmurs, marked nonpitting edema  BLE Gastrointestinal system: Soft, NT/ND, normal bowel sounds Central nervous system: Alert and oriented. No focal neurological deficits. Extremities: Marked BLE nonpitting edema, elephantiasis appearing, marked skin breakdown Skin: No rashes, lesions or ulcers Psychiatry: Judgement and insight appear impaired. Mood & affect flattened.     Data Reviewed: I have personally reviewed following labs and imaging studies  CBC: Recent Labs  Lab 10/29/21 0558 10/30/21 0842 10/31/21 0600 11/01/21 0239 11/02/21 0355  WBC 13.2* 12.6* 13.1* 10.9* 15.3*  HGB 7.4* 8.3* 8.2* 8.2* 8.4*  HCT 21.5* 23.8* 23.6* 24.6* 24.5*  MCV 81.7 81.8 82.8 83.4 83.6  PLT 149* 160 139* 149* 562   Basic Metabolic Panel: Recent Labs  Lab 10/27/21 0632 10/28/21 0514 10/29/21 0558 10/30/21 0842 10/31/21 0600 11/01/21 0239 11/02/21 0355  NA 142 143 142 138 137 139 138  K 2.8* 3.0* 3.2* 3.6 3.8 3.8 3.6  CL 110 113* 111 109 110 110 108  CO2 23 24 23  21* 23 23 22   GLUCOSE 121* 109* 90 99 99 143* 136*  BUN 29* 31* 33* 37* 43* 45* 54*  CREATININE 1.76* 1.78* 1.58* 1.66* 1.81* 1.76* 1.81*  CALCIUM 7.5* 7.4* 7.6* 7.8* 7.6* 8.0* 8.0*  MG 1.8 1.7 1.7 1.7 1.7 2.0 1.7  PHOS 1.8* 1.9* 2.3* 2.8 3.2  --   --  GFR: Estimated Creatinine Clearance: 49.5 mL/min (A) (by C-G formula based on SCr of 1.81 mg/dL (H)). Liver Function Tests: No results for input(s): AST, ALT, ALKPHOS, BILITOT, PROT, ALBUMIN in the last 168 hours. No results for input(s): LIPASE, AMYLASE in the last 168 hours. No results for input(s): AMMONIA in the last 168 hours. Coagulation Profile: No results for input(s): INR, PROTIME in the last 168 hours. Cardiac Enzymes: No results for input(s): CKTOTAL, CKMB, CKMBINDEX, TROPONINI in the last 168 hours. BNP (last 3 results) No results for input(s): PROBNP in the last 8760 hours. HbA1C: No results for input(s): HGBA1C in the last 72 hours. CBG: Recent Labs  Lab 10/29/21 0848 10/29/21 1727  10/31/21 1212  GLUCAP 78 114* 92   Lipid Profile: No results for input(s): CHOL, HDL, LDLCALC, TRIG, CHOLHDL, LDLDIRECT in the last 72 hours. Thyroid Function Tests: No results for input(s): TSH, T4TOTAL, FREET4, T3FREE, THYROIDAB in the last 72 hours. Anemia Panel: No results for input(s): VITAMINB12, FOLATE, FERRITIN, TIBC, IRON, RETICCTPCT in the last 72 hours. Sepsis Labs: Recent Labs  Lab 10/27/21 9937  PROCALCITON 41.57    Recent Results (from the past 240 hour(s))  Culture, blood (Routine X 2) w Reflex to ID Panel     Status: None (Preliminary result)   Collection Time: 10/31/21  9:30 AM   Specimen: BLOOD LEFT HAND  Result Value Ref Range Status   Specimen Description BLOOD LEFT HAND  Final   Special Requests   Final    BOTTLES DRAWN AEROBIC AND ANAEROBIC Blood Culture adequate volume   Culture   Final    NO GROWTH 2 DAYS Performed at [facility], [address], [place], [place] [address]    Report Status PENDING  Incomplete  Culture, blood (Routine X 2) w Reflex to ID Panel     Status: None (Preliminary result)   Collection Time: 10/31/21  9:30 AM   Specimen: BLOOD LEFT WRIST  Result Value Ref Range Status   Specimen Description BLOOD LEFT WRIST  Final   Special Requests   Final    BOTTLES DRAWN AEROBIC AND ANAEROBIC Blood Culture adequate volume   Culture   Final    NO GROWTH 2 DAYS Performed at [facility], [address] [place], [place] [address]    Report Status PENDING  Incomplete  MRSA Next Gen by PCR, Nasal     Status: Abnormal   Collection Time: 10/31/21 12:15 PM   Specimen: Nasal Mucosa; Nasal Swab  Result Value Ref Range Status   MRSA by PCR Next Gen DETECTED (A) NOT DETECTED Final    Comment: RESULT CALLED TO, READ BACK BY AND VERIFIED WITH: BROOKE DARNEL 10/31/21 1357 MU (NOTE) The GeneXpert MRSA Assay (FDA approved for NASAL specimens only), is one component of a comprehensive MRSA colonization surveillance program. It  is not intended to diagnose MRSA infection nor to guide or monitor treatment for MRSA infections. Test performance is not FDA approved in patients less than 12 years old. Performed at [facility], [address] [place], [place] [address]   Gastrointestinal Panel by PCR , Stool     Status: None   Collection Time: 10/31/21 10:05 PM   Specimen: Rectum; Stool  Result Value Ref Range Status   Campylobacter species NOT DETECTED NOT DETECTED Final   Plesimonas shigelloides NOT DETECTED NOT DETECTED Final   Salmonella species NOT DETECTED NOT DETECTED Final   Yersinia enterocolitica NOT DETECTED NOT DETECTED Final   Vibrio species NOT  DETECTED NOT DETECTED Final   Vibrio cholerae NOT DETECTED NOT DETECTED Final   Enteroaggregative E coli (EAEC) NOT DETECTED NOT DETECTED Final   Enteropathogenic E coli (EPEC) NOT DETECTED NOT DETECTED Final   Enterotoxigenic E coli (ETEC) NOT DETECTED NOT DETECTED Final   Shiga like toxin producing E coli (STEC) NOT DETECTED NOT DETECTED Final   Shigella/Enteroinvasive E coli (EIEC) NOT DETECTED NOT DETECTED Final   Cryptosporidium NOT DETECTED NOT DETECTED Final   Cyclospora cayetanensis NOT DETECTED NOT DETECTED Final   Entamoeba histolytica NOT DETECTED NOT DETECTED Final   Giardia lamblia NOT DETECTED NOT DETECTED Final   Adenovirus F40/41 NOT DETECTED NOT DETECTED Final   Astrovirus NOT DETECTED NOT DETECTED Final   Norovirus GI/GII NOT DETECTED NOT DETECTED Final   Rotavirus A NOT DETECTED NOT DETECTED Final   Sapovirus (I, II, IV, and V) NOT DETECTED NOT DETECTED Final    Comment: Performed at [facility], [address], [place], [place] [address]         Radiology Studies: DG Abd Portable 1V  Result Date: 10/31/2021 CLINICAL DATA:  Diarrhea. EXAM: PORTABLE ABDOMEN - 1 VIEW COMPARISON:  CT dated 08/27/2008. FINDINGS: No bowel dilatation or evidence of obstruction. No free air. Indeterminate 15 mm rounded calcific focus  in the left upper abdomen. Degenerative changes of the spine. No acute osseous pathology. IMPRESSION: No bowel dilatation or evidence of obstruction. Electronically Signed   By: Anner Crete M.D.   On: 10/31/2021 23:40        Scheduled Meds:  (feeding supplement) PROSource Plus  30 mL Oral TID BM   sodium chloride   Intravenous Once   vitamin C  500 mg Oral BID   Chlorhexidine Gluconate Cloth  6 each Topical Daily   enoxaparin (LOVENOX) injection  0.5 mg/kg Subcutaneous Q24H   feeding supplement  237 mL Oral TID BM   fludrocortisone  0.1 mg Oral Daily   hydrocortisone sod succinate (SOLU-CORTEF) inj  50 mg Intravenous Q8H   Ipratropium-Albuterol  1 puff Inhalation Q6H   liver oil-zinc oxide   Topical TID   midodrine  10 mg Oral TID WC   multivitamin with minerals  1 tablet Oral Daily   mupirocin ointment  1 application Nasal BID   pantoprazole (PROTONIX) IV  40 mg Intravenous Daily   PARoxetine  30 mg Oral Daily   sodium chloride flush  10-40 mL Intracatheter Q12H   Vitamin D (Ergocalciferol)  50,000 Units Oral Q7 days   zinc sulfate  220 mg Oral Daily   Continuous Infusions:  sodium chloride 5 mL/hr at 10/26/21 1335   albumin human 12.5 g (11/02/21 1014)     LOS: 10 days    Time spent: 25 minutes    Sidney Ace, MD [facility]   If 7PM-7AM, please contact night-coverage  11/02/2021, 2:52 PM

## 2021-11-03 ENCOUNTER — Inpatient Hospital Stay: Payer: Medicare HMO

## 2021-11-03 DIAGNOSIS — R630 Anorexia: Secondary | ICD-10-CM

## 2021-11-03 DIAGNOSIS — G9341 Metabolic encephalopathy: Secondary | ICD-10-CM

## 2021-11-03 DIAGNOSIS — R7881 Bacteremia: Secondary | ICD-10-CM

## 2021-11-03 LAB — CBC
HCT: 23.4 % — ABNORMAL LOW (ref 36.0–46.0)
Hemoglobin: 8 g/dL — ABNORMAL LOW (ref 12.0–15.0)
MCH: 28 pg (ref 26.0–34.0)
MCHC: 34.2 g/dL (ref 30.0–36.0)
MCV: 81.8 fL (ref 80.0–100.0)
Platelets: 174 10*3/uL (ref 150–400)
RBC: 2.86 MIL/uL — ABNORMAL LOW (ref 3.87–5.11)
RDW: 19.6 % — ABNORMAL HIGH (ref 11.5–15.5)
WBC: 12.7 10*3/uL — ABNORMAL HIGH (ref 4.0–10.5)
nRBC: 0.2 % (ref 0.0–0.2)

## 2021-11-03 LAB — BLOOD CULTURE ID PANEL (REFLEXED) - BCID2

## 2021-11-03 LAB — BASIC METABOLIC PANEL
Anion gap: 7 (ref 5–15)
BUN: 64 mg/dL — ABNORMAL HIGH (ref 8–23)
CO2: 22 mmol/L (ref 22–32)
Calcium: 7.9 mg/dL — ABNORMAL LOW (ref 8.9–10.3)
Chloride: 107 mmol/L (ref 98–111)
Creatinine, Ser: 1.71 mg/dL — ABNORMAL HIGH (ref 0.44–1.00)
GFR, Estimated: 31 mL/min — ABNORMAL LOW (ref >60)
Glucose, Bld: 111 mg/dL — ABNORMAL HIGH (ref 70–99)
Potassium: 3.5 mmol/L (ref 3.5–5.1)
Sodium: 136 mmol/L (ref 135–145)

## 2021-11-03 LAB — MAGNESIUM: Magnesium: 2 mg/dL (ref 1.7–2.4)

## 2021-11-03 MED ORDER — PIPERACILLIN-TAZOBACTAM 3.375 G IVPB
3.3750 g | Freq: Three times a day (TID) | INTRAVENOUS | Status: DC
  Administered 2021-11-03 – 2021-11-05 (×7): 3.375 g via INTRAVENOUS
  Filled 2021-11-03 (×7): qty 50

## 2021-11-03 MED ORDER — HYDROCORTISONE SOD SUC (PF) 100 MG IJ SOLR
50.0000 mg | Freq: Two times a day (BID) | INTRAMUSCULAR | Status: DC
  Administered 2021-11-03: 23:00:00 50 mg via INTRAVENOUS
  Filled 2021-11-03 (×2): qty 1

## 2021-11-03 NOTE — Progress Notes (Signed)
Nutrition Follow-up  DOCUMENTATION CODES:   Severe malnutrition in context of acute illness/injury, Morbid obesity  INTERVENTION:   -Continue Magic cup TID with meals, each supplement provides 290 kcal and 9 grams of protein  -Continue Ensure Enlive po TID, each supplement provides 350 kcal and 20 grams of protein  -Continue MVI with minerals daily -Continue feeding assistance with meals -Continue 30 ml Prosource Plus TID, each supplement provides 100 kcals and 30 grams protein  NUTRITION DIAGNOSIS:   Severe Malnutrition related to acute illness (COVID-19) as evidenced by severe fat depletion, severe muscle depletion, energy intake < or equal to 50% for > or equal to 1 month, edema.  Ongoing  GOAL:   Patient will meet greater than or equal to 90% of their needs  Progressing   MONITOR:   PO intake, Supplement acceptance, Diet advancement, Labs, Weight trends, Skin, I & O's  REASON FOR ASSESSMENT:   Consult Assessment of nutrition requirement/status  ASSESSMENT:   Kayla Long is a 75 y.o. female with medical history significant of hypotension on midodrine, severe lymphedema, bedbound status, diastolic CHF, hypertension presented to the ED on morning of 10/23/2021 with reports by family of very poor p.o. intake and worsening confusion for the past 3 days.  EMS was called to the home and found patient's blood pressure 87/40.  Patient had been unable to take her midodrine.  Family reported patient recently having cough, very poor appetite with little p.o. intake but no apparent abdominal pain nausea or vomiting.  Family reported patient's mental status nowhere near baseline.  She is typically conversant, witty and quite interactive but has been more withdrawn.  Exposed to COVID-19  12/12- advanced to dysphagia 2 diet per family request 12/14- downgraded back to dysphagia 1 diet due to high aspiration risk  Reviewed I/O's: +676 ml x 24 hours and +4 L since admission  UOP:  200 ml x 24 hours   Pt transferred back to floor from ICU yesterday.   Pt does not care for dysphagia 1 diet, however, was downgraded due to high aspiration risk. Intake has improved since last visit (noted meal completions 50%). Pt is also consuming supplements. Per previous notes, pt and family do not desire PEG or NGT.   Per [facility] notes, pt with new areas of skin breakdown.   Medications reviewed and include albumin, vitamin C, vitamin C, and zinc sulfate.   Labs reviewed: CBGS: 92.  Diet Order:   Diet Order             DIET - DYS 1 Room service appropriate? Yes; Fluid consistency: Thin  Diet effective now                   EDUCATION NEEDS:   Education needs have been addressed  Skin:  Skin Assessment: Skin Integrity Issues: Skin Integrity Issues:: Other (Comment), Stage II Stage II: sacrum, lt/lt buttocks Other: MASD to bilateral buttocks, sacrum, inner upper thighs, posterior upper thighs; lt toe laceration, chronic kin changes to bilateral LE, full thickness wound under lt thigh, full thickness wound to rt goin, lt 5th toe trauma wound  Last BM:  11/02/21 (via rectal tube)  Height:   Ht Readings from Last 1 Encounters:  07/01/21 5\' 6"  (1.676 m)    Weight:   Wt Readings from Last 1 Encounters:  11/02/21 (!) 194 kg    Ideal Body Weight:  59.1 kg  BMI:  Body mass index is 69.03 kg/m.  Estimated Nutritional Needs:  Kcal:  2150-2350  Protein:  125-150 grams  Fluid:  > 2 L    Loistine Chance, RD, LDN, [place] Registered Dietitian II Certified Diabetes Care and Education Specialist Please refer to [facility] for RD and/or RD on-call/weekend/after hours pager

## 2021-11-03 NOTE — TOC Progression Note (Addendum)
Transition of Care [facility]) - Progression Note    Patient Details  Name: Kayla Long MRN: [id number] Date of Birth: 1946-01-28  Transition of Care [facility] CM/SW [place], RN Phone Number: 11/03/2021, 2:19 PM  Clinical Narrative: Spoke with patient friend Colin Mulders at bedside. Discussed discharge plans for tomorrow, Sat. 11/04/21. They ask that I call daughter [name] [phone] or [name] Patient [phone] to discuss. Called [name] no answer, left VM; called Charlene and she consents to [facility] Wound care and understands patient will be discharged tomorrow if medically stable; request that we arrange EMS transport, confirmed address on file as discharge. Contacted [place], [place], [facility] , [employer] [facility] they all declined, no nurses available. [facility] pending. 2:35pm Verda Cumins returned call and consent with providing wound care contact information provided.    Expected Discharge Plan:  (to be determined) Barriers to Discharge: Continued Medical Work up  Expected Discharge Plan and Services Expected Discharge Plan:  (to be determined)   Discharge Planning Services: CM Consult   Living arrangements for the past 2 months: Single Family Home                 DME Arranged: N/A DME Agency: NA       HH Arranged: NA HH Agency: NA         Social Determinants of Health (SDOH) Interventions    Readmission Risk Interventions Readmission Risk Prevention Plan 10/24/2021  Transportation Screening Complete  PCP or Specialist Appt within 3-5 Days Complete  HRI or Home Care Consult Complete  Social Work Consult for [place] Planning/Counseling Complete  Palliative Care Screening Complete  Medication Review Press photographer) Complete  Some recent data might be hidden

## 2021-11-03 NOTE — Consult Note (Signed)
Pharmacy Antibiotic Note  Kayla Long is a 75 y.o. female admitted on 10/23/2021 with  enterobacterales bacteremia , has skin breakdown and lymphedema.  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 Pharmacy has been consulted for Zosyn dosing.  Plan: Zosyn 3.375g IV q8h (4 hour infusion).  Weight: (!) 194 kg (427 lb 11.1 oz)  Temp (24hrs), Avg:97.8 F (36.6 C), Min:97.5 F (36.4 C), Max:98.3 F (36.8 C)  Recent Labs  Lab 10/30/21 0842 10/31/21 0600 11/01/21 0239 11/02/21 0355 11/03/21 0432  WBC 12.6* 13.1* 10.9* 15.3* 12.7*  CREATININE 1.66* 1.81* 1.76* 1.81* 1.71*    Estimated Creatinine Clearance: 52.4 mL/min (A) (by C-G formula based on SCr of 1.71 mg/dL (H)).    Allergies  Allergen Reactions   Fluoxetine Hcl     REACTION: headache    Antimicrobials this admission: Zosyn 12/16 >>   Microbiology results: 12/13 BCx: enterobacterales   Thank you for allowing pharmacy to be a part of this patients care.  Darlette Dubow A Verlin Uher 11/03/2021 2:45 PM

## 2021-11-03 NOTE — Progress Notes (Signed)
PHARMACY - PHYSICIAN COMMUNICATION CRITICAL VALUE ALERT - BLOOD CULTURE IDENTIFICATION (BCID)  Kayla Long is an 75 y.o. female who presented to [facility] on 10/23/2021 with a chief complaint of poor po intake, confusion on 12/5  Assessment:  blood culture from 12/13 GNR 1/4 bottles, BCID enterobacterales detected.  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  She has skin breakdown and lymphedmea  Name of physician (or Provider) Contacted: Dr Delaine Lame consulted  Current antibiotics: None  Changes to prescribed antibiotics recommended:  ID to evaluate  Results for orders placed or performed during the hospital encounter of 10/23/21  Blood Culture ID Panel (Reflexed) (Collected: 10/31/2021  9:30 AM)  Result Value Ref Range   Enterococcus faecalis NOT DETECTED NOT DETECTED   Enterococcus Faecium NOT DETECTED NOT DETECTED   Listeria monocytogenes NOT DETECTED NOT DETECTED   Staphylococcus species NOT DETECTED NOT DETECTED   Staphylococcus aureus (BCID) NOT DETECTED NOT DETECTED   Staphylococcus epidermidis NOT DETECTED NOT DETECTED   Staphylococcus lugdunensis NOT DETECTED NOT DETECTED   Streptococcus species NOT DETECTED NOT DETECTED   Streptococcus agalactiae NOT DETECTED NOT DETECTED   Streptococcus pneumoniae NOT DETECTED NOT DETECTED   Streptococcus pyogenes NOT DETECTED NOT DETECTED   A.calcoaceticus-baumannii NOT DETECTED NOT DETECTED   Bacteroides fragilis NOT DETECTED NOT DETECTED   Enterobacterales DETECTED (A) NOT DETECTED   Enterobacter cloacae complex NOT DETECTED NOT DETECTED   Escherichia coli NOT DETECTED NOT DETECTED   Klebsiella aerogenes NOT DETECTED NOT DETECTED   Klebsiella oxytoca NOT DETECTED NOT DETECTED   Klebsiella pneumoniae NOT DETECTED NOT DETECTED   Proteus species NOT DETECTED NOT DETECTED   Salmonella species NOT DETECTED NOT DETECTED   Serratia marcescens NOT DETECTED NOT DETECTED   Haemophilus influenzae  NOT DETECTED NOT DETECTED   Neisseria meningitidis NOT DETECTED NOT DETECTED   Pseudomonas aeruginosa NOT DETECTED NOT DETECTED   Stenotrophomonas maltophilia NOT DETECTED NOT DETECTED   Candida albicans NOT DETECTED NOT DETECTED   Candida auris NOT DETECTED NOT DETECTED   Candida glabrata NOT DETECTED NOT DETECTED   Candida krusei NOT DETECTED NOT DETECTED   Candida parapsilosis NOT DETECTED NOT DETECTED   Candida tropicalis NOT DETECTED NOT DETECTED   Cryptococcus neoformans/gattii NOT DETECTED NOT DETECTED   CTX-M ESBL NOT DETECTED NOT DETECTED   Carbapenem resistance IMP NOT DETECTED NOT DETECTED   Carbapenem resistance KPC NOT DETECTED NOT DETECTED   Carbapenem resistance NDM NOT DETECTED NOT DETECTED   Carbapenem resist OXA 48 LIKE NOT DETECTED NOT DETECTED   Carbapenem resistance VIM NOT DETECTED NOT DETECTED    Doreene Eland, PharmD, BCPS, BCIDP Work Cell: [phone] 11/03/2021 1:36 PM

## 2021-11-03 NOTE — Consult Note (Addendum)
NAME: Kayla Long  DOB: 1946-05-29  MRN: [id number]  Date/Time: 11/03/2021 1:19 PM  REQUESTING PROVIDER: Dr.Sreenath Subjective:  REASON FOR CONSULT: gram neg bacteremia ? Kayla Long is a 75 y.o.female  with a history of significant morbid obesity, hypertension, bilateral lymphedema, chronically bedbound presents to the ED with chief concerns of  lethargy, altered mental status , poor intake for three days on 10/23/21 She had a similar presentation in Aug 2022 and that time had Citrobacter bacteremia thought to be from her skin  In the ED temp 99.3, HR 122, BP 100/56. Labs cr 1.48, K 3, glucose 61, albumin  1.5, Hb  6.5  WBC 13.4 She tested positive for covid. She was started on remdisivir  but no steroids as she was not hypoxic Also diagnosed with encephalopathy, AKI, acute on chronic anemia and anorexia Pt was hypotensive overa period of time and given midodrine,. On 10/31/21 she was transferred to SDU because of persistent hypotension- blood culture sent and she was started on fludrocortisone and hydrocortisone. Blood cultures were sent that night It came back positive today I am seeing the patient for the same' Pt has PICC since 10/23/21 She has no fever no chills No pain abdomen Has loose stools but she does not eat anything solid and is only on ensure and prosure liquid supplements Has a cough which is wet but unable to cough up sputum Past Medical History:  Diagnosis Date   Hx of colonic polyps    tubulovillous adenoma   Hyperlipidemia    IBS (irritable bowel syndrome)    Spinal stenosis    with secondary neuropathy    Past Surgical History:  Procedure Laterality Date   PARTIAL HYSTERECTOMY     vaginal deliveries     x2   VESICOVAGINAL FISTULA CLOSURE W/ TAH      Social History   Socioeconomic History   Marital status: Divorced    Spouse name: Not on file   Number of children: Not on file   Years of education: Not on file   Highest education level: Not on  file  Occupational History   Not on file  Tobacco Use   Smoking status: Former    Types: Cigarettes   Smokeless tobacco: Never   Tobacco comments:    off and on?  Substance and Sexual Activity   Alcohol use: Yes    Comment: occasional    Drug use: Never   Sexual activity: Not Currently  Other Topics Concern   Not on file  Social History Narrative   Divorced,  2 children.    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   Daughter-Fances Valin [phone]   Social Determinants of Health   Financial Resource Strain: Not on file  Food Insecurity: Not on file  Transportation Needs: Not on file  Physical Activity: Not on file  Stress: Not on file  Social Connections: Not on file  Intimate Partner Violence: Not on file    Family History  Problem Relation Age of Onset   Stroke Mother    Stroke Father    Heart disease Brother    Allergies  Allergen Reactions   Fluoxetine Hcl     REACTION: headache   I? Current Facility-Administered Medications  Medication Dose Route Frequency Provider Last Rate Last Admin   (feeding supplement) PROSource Plus liquid 30 mL  30 mL Oral TID BM Enzo Bi, MD   30 mL at 11/03/21 1236   0.9 %  sodium chloride infusion (Manually program via Guardrails IV Fluids)   Intravenous Once Enzo Bi, MD       0.9 %  sodium chloride infusion   Intravenous PRN Enzo Bi, MD 5 mL/hr at 10/26/21 1335 Infusion Verify at 10/26/21 1335   acetaminophen (TYLENOL) tablet 650 mg  650 mg Oral Q6H PRN Sharion Settler, NP   650 mg at 11/02/21 1601   ascorbic acid (VITAMIN C) tablet 500 mg  500 mg Oral BID Ralene Muskrat B, MD   500 mg at 11/03/21 2353   bisacodyl (DULCOLAX) suppository 10 mg  10 mg Rectal Daily PRN Nicole Kindred A, DO       Chlorhexidine Gluconate Cloth 2 % PADS 6 each  6 each Topical Daily Nicole Kindred A, DO   6 each at 11/03/21 0818   chlorpheniramine-HYDROcodone (Central) 10-8 MG/5ML suspension 5 mL   5 mL Oral Q12H PRN Enzo Bi, MD   5 mL at 11/01/21 2156   enoxaparin (LOVENOX) injection 97.5 mg  0.5 mg/kg Subcutaneous Q24H Nicole Kindred A, DO   97.5 mg at 11/03/21 0105   feeding supplement (ENSURE ENLIVE / ENSURE PLUS) liquid 237 mL  237 mL Oral TID BM Enzo Bi, MD   237 mL at 11/03/21 1237   fludrocortisone (FLORINEF) tablet 0.1 mg  0.1 mg Oral Daily Flora Lipps, MD   0.1 mg at 11/03/21 6144   hydrocortisone sodium succinate (SOLU-CORTEF) 100 MG injection 50 mg  50 mg Intravenous Q12H Sreenath, Sudheer B, MD       Ipratropium-Albuterol (COMBIVENT) respimat 1 puff  1 puff Inhalation Q6H Nicole Kindred A, DO   1 puff at 11/03/21 1237   liver oil-zinc oxide (DESITIN) 40 % ointment   Topical TID Mansy, Arvella Merles, MD   Given at 11/03/21 0841   loperamide (IMODIUM) capsule 2 mg  2 mg Oral PRN Ralene Muskrat B, MD   2 mg at 11/02/21 1601   menthol-cetylpyridinium (CEPACOL) lozenge 3 mg  1 lozenge Oral PRN Enzo Bi, MD       midodrine (PROAMATINE) tablet 10 mg  10 mg Oral TID WC Ralene Muskrat B, MD   10 mg at 11/03/21 1236   multivitamin with minerals tablet 1 tablet  1 tablet Oral Daily Nicole Kindred A, DO   1 tablet at 11/03/21 3154   mupirocin ointment (BACTROBAN) 2 % 1 application  1 application Nasal BID Enzo Bi, MD   1 application at 00/86/76 1023   ondansetron (ZOFRAN) tablet 4 mg  4 mg Oral Q6H PRN Nicole Kindred A, DO       Or   ondansetron (ZOFRAN) injection 4 mg  4 mg Intravenous Q6H PRN Nicole Kindred A, DO   4 mg at 10/30/21 1806   pantoprazole (PROTONIX) injection 40 mg  40 mg Intravenous Daily Ralene Muskrat B, MD   40 mg at 11/03/21 1950   PARoxetine (PAXIL) tablet 30 mg  30 mg Oral Daily Nicole Kindred A, DO   30 mg at 11/03/21 9326   senna-docusate (Senokot-S) tablet 1 tablet  1 tablet Oral QHS PRN Nicole Kindred A, DO       sodium chloride flush (NS) 0.9 % injection 10-40 mL  10-40 mL Intracatheter Q12H Nicole Kindred A, DO   10 mL at 11/03/21 0841   sodium  chloride flush (NS) 0.9 % injection 10-40 mL  10-40 mL Intracatheter PRN Nicole Kindred A, DO       trolamine salicylate (ASPERCREME) 10 % cream  Topical BID PRN Sharion Settler, NP   Given at 11/02/21 2212   Vitamin D (Ergocalciferol) (DRISDOL) capsule 50,000 Units  50,000 Units Oral Q7 days Murlean Iba, MD   50,000 Units at 10/28/21 1855   zinc sulfate capsule 220 mg  220 mg Oral Daily Nicole Kindred A, DO   220 mg at 11/03/21 9179     Abtx:  Anti-infectives (From admission, onward)    Start     Dose/Rate Route Frequency Ordered Stop   10/24/21 1000  remdesivir 100 mg in sodium chloride 0.9 % 100 mL IVPB       See Hyperspace for full Linked Orders Report.   100 mg 200 mL/hr over 30 Minutes Intravenous Daily 10/23/21 1228 10/27/21 2152   10/23/21 1400  remdesivir 200 mg in sodium chloride 0.9% 250 mL IVPB       See Hyperspace for full Linked Orders Report.   200 mg 580 mL/hr over 30 Minutes Intravenous Once 10/23/21 1228 10/23/21 1634       REVIEW OF SYSTEMS:  Const: negative fever, negative chills, negative weight loss Eyes: negative diplopia or visual changes, negative eye pain ENT: negative coryza, negative sore throat Resp:cough, , dyspnea Cards: negative for chest pain, palpitations, ++++lower extremity edema GU: negative for frequency, dysuria and hematuria GI: Negative for abdominal pain, ++diarrhea, bleeding, constipation Skin: as above Heme: negative for easy bruising and gum/nose bleeding MS: muscle weakness Neurolo:bed bound, confusion  Psych: anxiety, depression  Endocrine: negative for thyroid, diabetes Allergy/Immunology- as above: Objective:  VITALS:  BP (!) 108/52 (BP Location: Left Arm)    Pulse 99    Temp 97.7 F (36.5 C) (Oral)    Resp 16    Wt (!) 194 kg    SpO2 100%    BMI 69.03 kg/m  PHYSICAL EXAM:  General:  Awake, cooperative, no distress, very weak Head: Normocephalic, without obvious abnormality, atraumatic. Eyes: Conjunctivae clear,  anicteric sclerae. Pupils are equal ENT Nares normal. No drainage or sinus tenderness. Lips, mucosa, and tongue normal. No Thrush Neck: Supple, symmetrical, no adenopathy, thyroid: non tender no carotid bruit and no JVD. Back: No CVA tenderness. Lungs: b.l air entry- crepts present in both bases Heart: s1s2 Abdomen: Soft, non-tender,not distended. Bowel sounds normal. No masses Extremities: severe lymphedema legs with hyperkeratosis                 Tears on the skin over the sacrum Skin: No rashes or lesions. Or bruising Lymph: Cervical, supraclavicular normal. Neurologic: Grossly non-focal Pertinent Labs Lab Results CBC    Component Value Date/Time   WBC 12.7 (H) 11/03/2021 0432   RBC 2.86 (L) 11/03/2021 0432   HGB 8.0 (L) 11/03/2021 0432   HCT 23.4 (L) 11/03/2021 0432   PLT 174 11/03/2021 0432   MCV 81.8 11/03/2021 0432   MCH 28.0 11/03/2021 0432   MCHC 34.2 11/03/2021 0432   RDW 19.6 (H) 11/03/2021 0432   LYMPHSABS 0.8 10/25/2021 0612   MONOABS 0.3 10/25/2021 0612   EOSABS 0.0 10/25/2021 0612   BASOSABS 0.0 10/25/2021 0612    CMP Latest Ref Rng & Units 11/03/2021 11/02/2021 11/01/2021  Glucose 70 - 99 mg/dL 111(H) 136(H) 143(H)  BUN 8 - 23 mg/dL 64(H) 54(H) 45(H)  Creatinine 0.44 - 1.00 mg/dL 1.71(H) 1.81(H) 1.76(H)  Sodium 135 - 145 mmol/L 136 138 139  Potassium 3.5 - 5.1 mmol/L 3.5 3.6 3.8  Chloride 98 - 111 mmol/L 107 108 110  CO2 22 - 32 mmol/L 22 22 23  Calcium 8.9 - 10.3 mg/dL 7.9(L) 8.0(L) 8.0(L)  Total Protein 6.5 - 8.1 g/dL - - -  Total Bilirubin 0.3 - 1.2 mg/dL - - -  Alkaline Phos 38 - 126 U/L - - -  AST 15 - 41 U/L - - -  ALT 0 - 44 U/L - - -      Microbiology: Recent Results (from the past 240 hour(s))  Culture, blood (Routine X 2) w Reflex to ID Panel     Status: None (Preliminary result)   Collection Time: 10/31/21  9:30 AM   Specimen: BLOOD LEFT HAND  Result Value Ref Range Status   Specimen Description BLOOD LEFT HAND   Final   Special Requests   Final    BOTTLES DRAWN AEROBIC AND ANAEROBIC Blood Culture adequate volume   Culture   Final    NO GROWTH 2 DAYS Performed at [facility], [address] [place], [place] [address]    Report Status PENDING  Incomplete  Culture, blood (Routine X 2) w Reflex to ID Panel     Status: None (Preliminary result)   Collection Time: 10/31/21  9:30 AM   Specimen: BLOOD LEFT WRIST  Result Value Ref Range Status   Specimen Description BLOOD LEFT WRIST  Final   Special Requests   Final    BOTTLES DRAWN AEROBIC AND ANAEROBIC Blood Culture adequate volume   Culture  Setup Time   Final    AEROBIC BOTTLE ONLY GRAM NEGATIVE RODS Organism ID to follow CRITICAL RESULT CALLED TO, READ BACK BY AND VERIFIED WITH: ABBY ELLINGTON @1311  11/03/21 MJU Performed at [facility], [address] [place], [place] [address]    Culture GRAM NEGATIVE RODS  Final   Report Status PENDING  Incomplete  Blood Culture ID Panel (Reflexed)     Status: Abnormal   Collection Time: 10/31/21  9:30 AM  Result Value Ref Range Status   Enterococcus faecalis NOT DETECTED NOT DETECTED Final   Enterococcus Faecium NOT DETECTED NOT DETECTED Final   Listeria monocytogenes NOT DETECTED NOT DETECTED Final   Staphylococcus species NOT DETECTED NOT DETECTED Final   Staphylococcus aureus (BCID) NOT DETECTED NOT DETECTED Final   Staphylococcus epidermidis NOT DETECTED NOT DETECTED Final   Staphylococcus lugdunensis NOT DETECTED NOT DETECTED Final   Streptococcus species NOT DETECTED NOT DETECTED Final   Streptococcus agalactiae NOT DETECTED NOT DETECTED Final   Streptococcus pneumoniae NOT DETECTED NOT DETECTED Final   Streptococcus pyogenes NOT DETECTED NOT DETECTED Final   A.calcoaceticus-baumannii NOT DETECTED NOT DETECTED Final   Bacteroides fragilis NOT DETECTED NOT DETECTED Final   Enterobacterales DETECTED (A) NOT DETECTED Final    Comment: Enterobacterales represent a large  order of gram negative bacteria, not a single organism. Refer to culture for further identification. CRITICAL RESULT CALLED TO, READ BACK BY AND VERIFIED WITH: ABBY ELLINGTON @1311  11/03/21 MJU    Enterobacter cloacae complex NOT DETECTED NOT DETECTED Final   Escherichia coli NOT DETECTED NOT DETECTED Final   Klebsiella aerogenes NOT DETECTED NOT DETECTED Final   Klebsiella oxytoca NOT DETECTED NOT DETECTED Final   Klebsiella pneumoniae NOT DETECTED NOT DETECTED Final   Proteus species NOT DETECTED NOT DETECTED Final   Salmonella species NOT DETECTED NOT DETECTED Final   Serratia marcescens NOT DETECTED NOT DETECTED Final   Haemophilus influenzae NOT DETECTED NOT DETECTED Final   Neisseria meningitidis NOT DETECTED NOT DETECTED Final   Pseudomonas aeruginosa NOT DETECTED NOT DETECTED Final   Stenotrophomonas maltophilia NOT DETECTED NOT  DETECTED Final   Candida albicans NOT DETECTED NOT DETECTED Final   Candida auris NOT DETECTED NOT DETECTED Final   Candida glabrata NOT DETECTED NOT DETECTED Final   Candida krusei NOT DETECTED NOT DETECTED Final   Candida parapsilosis NOT DETECTED NOT DETECTED Final   Candida tropicalis NOT DETECTED NOT DETECTED Final   Cryptococcus neoformans/gattii NOT DETECTED NOT DETECTED Final   CTX-M ESBL NOT DETECTED NOT DETECTED Final   Carbapenem resistance IMP NOT DETECTED NOT DETECTED Final   Carbapenem resistance KPC NOT DETECTED NOT DETECTED Final   Carbapenem resistance NDM NOT DETECTED NOT DETECTED Final   Carbapenem resist OXA 48 LIKE NOT DETECTED NOT DETECTED Final   Carbapenem resistance VIM NOT DETECTED NOT DETECTED Final    Comment: Performed at [facility], [address] [place], [place] [address]  MRSA Next Gen by PCR, Nasal     Status: Abnormal   Collection Time: 10/31/21 12:15 PM   Specimen: Nasal Mucosa; Nasal Swab  Result Value Ref Range Status   MRSA by PCR Next Gen DETECTED (A) NOT DETECTED Final    Comment: RESULT CALLED  TO, READ BACK BY AND VERIFIED WITH: BROOKE DARNEL 10/31/21 1357 MU (NOTE) The GeneXpert MRSA Assay (FDA approved for NASAL specimens only), is one component of a comprehensive MRSA colonization surveillance program. It is not intended to diagnose MRSA infection nor to guide or monitor treatment for MRSA infections. Test performance is not FDA approved in patients less than 66 years old. Performed at [facility], [address] [place], [place] [address]   Gastrointestinal Panel by PCR , Stool     Status: None   Collection Time: 10/31/21 10:05 PM   Specimen: Rectum; Stool  Result Value Ref Range Status   Campylobacter species NOT DETECTED NOT DETECTED Final   Plesimonas shigelloides NOT DETECTED NOT DETECTED Final   Salmonella species NOT DETECTED NOT DETECTED Final   Yersinia enterocolitica NOT DETECTED NOT DETECTED Final   Vibrio species NOT DETECTED NOT DETECTED Final   Vibrio cholerae NOT DETECTED NOT DETECTED Final   Enteroaggregative E coli (EAEC) NOT DETECTED NOT DETECTED Final   Enteropathogenic E coli (EPEC) NOT DETECTED NOT DETECTED Final   Enterotoxigenic E coli (ETEC) NOT DETECTED NOT DETECTED Final   Shiga like toxin producing E coli (STEC) NOT DETECTED NOT DETECTED Final   Shigella/Enteroinvasive E coli (EIEC) NOT DETECTED NOT DETECTED Final   Cryptosporidium NOT DETECTED NOT DETECTED Final   Cyclospora cayetanensis NOT DETECTED NOT DETECTED Final   Entamoeba histolytica NOT DETECTED NOT DETECTED Final   Giardia lamblia NOT DETECTED NOT DETECTED Final   Adenovirus F40/41 NOT DETECTED NOT DETECTED Final   Astrovirus NOT DETECTED NOT DETECTED Final   Norovirus GI/GII NOT DETECTED NOT DETECTED Final   Rotavirus A NOT DETECTED NOT DETECTED Final   Sapovirus (I, II, IV, and V) NOT DETECTED NOT DETECTED Final    Comment: Performed at [facility], [address] [place], [place] [address]    IMAGING RESULTS:  I have personally reviewed the  films ? Impression/Recommendation ?pt admitted on 10/23/21 with lethargy, poor intake and altered mental status  Failure to thrive- severe anemia and hypoalbuminemia with malnutrition  Severe lymphedema upto hips with verrocosity and chronic wounds   Chronic hypotension- cortisol in aug was normal- np recent one On midodrine, florinef and hydrocortisone  Gram neg bacteremia - source is the  skin of the legs and sacrum because of extensive lymphedema and severe verrucosity and hyperkeratosis  and superficial tears Past h/o citrobacter bacteremia due to the skin n aug ? Pneumonia rt lobe- need to get sputum for culture Will start zosyn Dounbt it is due to PICC line which looks clean, site healthy  COVID 19 infection on admission- ct value 18- so acute infection Got remdisivir- now has a residual cough- CXR bibasilar atelectasis and rt sided pneumonia - this could aso be a cause of bacteremia  AKI on CKD  Morbid obesity- bed bound because of severe lymphedema  ? ? ___________________________________________________ Discussed with patient and care team Note:  This document was prepared using Dragon voice recognition software and may include unintentional dictation errors.

## 2021-11-03 NOTE — Care Management Important Message (Signed)
Important Message  Patient Details  Name: Kayla Long MRN: [id number] Date of Birth: [birth year]   Medicare Important Message Given:  Yes     Juliann Pulse A Jihaad Bruschi 11/03/2021, 2:55 PM

## 2021-11-03 NOTE — Progress Notes (Signed)
PROGRESS NOTE    Kayla Long  DVV:[id number] DOB: Feb 16, 1946 DOA: 10/23/2021 PCP: Pcp, No    Brief Narrative:  75 y.o. female with medical history significant of hypotension on midodrine, severe lymphedema, bedbound status, diastolic CHF, hypertension presented to the ED on morning of 10/23/2021 with reports by family of very poor p.o. intake and worsening confusion for the past 3 days.  EMS was called to the home and found patient's blood pressure 87/40.  Patient had been unable to take her midodrine.  Family reported patient recently having cough, very poor appetite with little p.o. intake but no apparent abdominal pain nausea or vomiting.  Family reported patient's mental status nowhere near baseline.  She is typically conversant, witty and quite interactive but has been more withdrawn.  Exposed to COVID-19 from a family member about a week prior.  Family reported patient had more oozing from her decubitus wounds recently.  Patient was transferred to stepdown unit for red mews secondary to tachycardia and hypotension.  Placed on midodrine, Florinef, stress dose steroids.  Blood pressure and heart rate improved.  Patient is remained stable.  Hypotension at baseline.  Stable for transfer back to medical telemetry on 12/15   Assessment & Plan:   Principal Problem:   COVID-19 virus infection Active Problems:   Lymphedema of both lower extremities   IBS (irritable bowel syndrome)   Severe sepsis (HCC)   Acute metabolic encephalopathy   Anorexia   Acute on chronic anemia   Hypomagnesemia   Hypokalemia   Hypotension   Acute kidney injury superimposed on CKD (HCC)   CKD (chronic kidney disease), stage III (HCC)   Pressure injury of skin of buttock   Chronic diastolic CHF (congestive heart failure) (HCC)   Depression   Protein-calorie malnutrition, severe   Sinus tachycardia Acute on chronic hypotension Tachycardia likely secondary to chronic illnesses and intravascular volume  depletion No signs of acute infection Patient's p.o. intake has been poor, though improving Tachycardia improving Plan: Continue midodrine 10 3 times daily per home dose Continue Florinef 0.1 mg daily, plan to discontinue tomorrow Continue tapering Solu-Cortef, 50 mg every 8 hours today with plans to discontinue tomorrow Discontinue IV albumin  Chronic lymphedema bilateral lower extremities Acute on chronic wounds Pressure injury to skin of buttock Extensive skin breakdown Per family fluid has been building up last 2 years Worsened with low albumin state BLE with hardened cobblestone texture and elephantiasis appearance Skin weeping noted Plan: Wound care reengaged Aldactone 50 mg p.o. daily No IV fluids Anticipated date of discharge 12/17  COVID-19 infection Severe sepsis secondary to [id number] Acute metabolic encephalopathy secondary to above Completed course of remdesivir AKI encephalopathy along with tachycardia, leukocytosis, hypotension Lactic acid normal, patient not in shock Sepsis physiology appears to be improving Weaning oral vasopressors Mental status improved  Acute kidney injury on chronic kidney disease stage IIIa Creatinine on presentation 1.48 Baseline 1.1 IVF discontinued Encourage oral hydration  Acute on chronic anemia 2/2 blood loss - baseline Hbg 7.7 three months ago, presented with Hbg 6.5.  Family report increased blood from wounds recently, so likely acute on chronic blood loss. --s/p 3u pRBC Plan: --monitor Hgb and transfuse to keep Hgb >7   Anorexia / Failure to thrive  Severe hypoalbuminemia  Severe Malnutrition Dysphagia --albumin low around 1.5 since Aug 2022. --noted to have reduced oral intake and difficulty swallowing meat.  SLP eval rec Dys 1 diet.   --Upgraded to Dys 2 per pt and family request for more  food options. --Unfortunately RN noted that patient continued to have overt aspiration on dysphagia 2 diet.  At this time unsafe  to progress diet beyond this point.  Patient remains a full code and given her tenuous state a aspiration event could prove disastrous. --pt refused tube feed Plan: Continue dysphagia 1 diet for now RD following for dietary supplementation   Hypokalemia / Hypomagnesemia   Hypophos --monitor and replete PRN   Chronic diastolic CHF  Echo 03/8526 in Care Everywhere EF >55%, grade II diastolic dysfunction.     IBS (irritable bowel syndrome)  - report as chronic issue. --home Senna-S PRN continued   Depression  - continue home Paxil   Morbid obesity, BMI 69.74   DVT prophylaxis: SQ Lovenox Code Status: Full code Family Communication:Daughter [name] at bedside 12/14, patient's friend at bedside 12/16 Disposition Plan: Status is: Inpatient  Remains inpatient appropriate because: Hypotension improving.  Blood pressure likely at baseline.  Clinically improving.  Anticipate discharge 12/17       Level of care: Telemetry Medical  Consultants:  None.  Nephrology and palliative care were following.  Now signed off  Procedures:  None  Antimicrobials: None   Subjective: Seen and examined.  Voice much stronger.  Seems to be eating more with assistance  Objective: Vitals:   11/02/21 2154 11/03/21 0050 11/03/21 0553 11/03/21 0751  BP: (!) 112/44 111/61 (!) 113/53 (!) 112/58  Pulse: (!) 108 100 (!) 110 (!) 102  Resp: 16 18 16 16   Temp: 98.3 F (36.8 C) 97.7 F (36.5 C) 97.9 F (36.6 C) (!) 97.5 F (36.4 C)  TempSrc: Oral Oral Oral Oral  SpO2: 100% 100% 98% 100%  Weight:        Intake/Output Summary (Last 24 hours) at 11/03/2021 1126 Last data filed at 11/03/2021 1041 Gross per 24 hour  Intake 515.83 ml  Output 200 ml  Net 315.83 ml   Filed Weights   10/23/21 1354 11/02/21 0344  Weight: (!) 196 kg (!) 194 kg    Examination:  General exam: No acute distress.  Frail Respiratory system: Lungs clear.  Normal work of breathing.  Room air Cardiovascular system:  S1-S2, RRR, no murmurs, marked nonpitting edema BLE Gastrointestinal system: Soft, NT/ND, normal bowel sounds Central nervous system: Alert and oriented. No focal neurological deficits. Extremities: Marked BLE nonpitting edema, elephantiasis appearing, marked skin breakdown Skin: No rashes, lesions or ulcers Psychiatry: Judgement and insight appear impaired. Mood & affect flattened.     Data Reviewed: I have personally reviewed following labs and imaging studies  CBC: Recent Labs  Lab 10/30/21 0842 10/31/21 0600 11/01/21 0239 11/02/21 0355 11/03/21 0432  WBC 12.6* 13.1* 10.9* 15.3* 12.7*  HGB 8.3* 8.2* 8.2* 8.4* 8.0*  HCT 23.8* 23.6* 24.6* 24.5* 23.4*  MCV 81.8 82.8 83.4 83.6 81.8  PLT 160 139* 149* 162 782   Basic Metabolic Panel: Recent Labs  Lab 10/28/21 0514 10/29/21 0558 10/30/21 0842 10/31/21 0600 11/01/21 0239 11/02/21 0355 11/03/21 0432  NA 143 142 138 137 139 138 136  K 3.0* 3.2* 3.6 3.8 3.8 3.6 3.5  CL 113* 111 109 110 110 108 107  CO2 24 23 21* 23 23 22 22   GLUCOSE 109* 90 99 99 143* 136* 111*  BUN 31* 33* 37* 43* 45* 54* 64*  CREATININE 1.78* 1.58* 1.66* 1.81* 1.76* 1.81* 1.71*  CALCIUM 7.4* 7.6* 7.8* 7.6* 8.0* 8.0* 7.9*  MG 1.7 1.7 1.7 1.7 2.0 1.7 2.0  PHOS 1.9* 2.3* 2.8 3.2  --   --   --  GFR: Estimated Creatinine Clearance: 52.4 mL/min (A) (by C-G formula based on SCr of 1.71 mg/dL (H)). Liver Function Tests: No results for input(s): AST, ALT, ALKPHOS, BILITOT, PROT, ALBUMIN in the last 168 hours. No results for input(s): LIPASE, AMYLASE in the last 168 hours. No results for input(s): AMMONIA in the last 168 hours. Coagulation Profile: No results for input(s): INR, PROTIME in the last 168 hours. Cardiac Enzymes: No results for input(s): CKTOTAL, CKMB, CKMBINDEX, TROPONINI in the last 168 hours. BNP (last 3 results) No results for input(s): PROBNP in the last 8760 hours. HbA1C: No results for input(s): HGBA1C in the last 72  hours. CBG: Recent Labs  Lab 10/29/21 0848 10/29/21 1727 10/31/21 1212  GLUCAP 78 114* 92   Lipid Profile: No results for input(s): CHOL, HDL, LDLCALC, TRIG, CHOLHDL, LDLDIRECT in the last 72 hours. Thyroid Function Tests: No results for input(s): TSH, T4TOTAL, FREET4, T3FREE, THYROIDAB in the last 72 hours. Anemia Panel: No results for input(s): VITAMINB12, FOLATE, FERRITIN, TIBC, IRON, RETICCTPCT in the last 72 hours. Sepsis Labs: No results for input(s): PROCALCITON, LATICACIDVEN in the last 168 hours.   Recent Results (from the past 240 hour(s))  Culture, blood (Routine X 2) w Reflex to ID Panel     Status: None (Preliminary result)   Collection Time: 10/31/21  9:30 AM   Specimen: BLOOD LEFT HAND  Result Value Ref Range Status   Specimen Description BLOOD LEFT HAND  Final   Special Requests   Final    BOTTLES DRAWN AEROBIC AND ANAEROBIC Blood Culture adequate volume   Culture   Final    NO GROWTH 2 DAYS Performed at [facility], [address] [place], [place] [address]    Report Status PENDING  Incomplete  Culture, blood (Routine X 2) w Reflex to ID Panel     Status: None (Preliminary result)   Collection Time: 10/31/21  9:30 AM   Specimen: BLOOD LEFT WRIST  Result Value Ref Range Status   Specimen Description BLOOD LEFT WRIST  Final   Special Requests   Final    BOTTLES DRAWN AEROBIC AND ANAEROBIC Blood Culture adequate volume   Culture   Final    NO GROWTH 2 DAYS Performed at [facility], [address] [place], [place] [address]    Report Status PENDING  Incomplete  MRSA Next Gen by PCR, Nasal     Status: Abnormal   Collection Time: 10/31/21 12:15 PM   Specimen: Nasal Mucosa; Nasal Swab  Result Value Ref Range Status   MRSA by PCR Next Gen DETECTED (A) NOT DETECTED Final    Comment: RESULT CALLED TO, READ BACK BY AND VERIFIED WITH: BROOKE DARNEL 10/31/21 1357 MU (NOTE) The GeneXpert MRSA Assay (FDA approved for NASAL specimens  only), is one component of a comprehensive MRSA colonization surveillance program. It is not intended to diagnose MRSA infection nor to guide or monitor treatment for MRSA infections. Test performance is not FDA approved in patients less than 72 years old. Performed at [facility], [address] [place], [place] [address]   Gastrointestinal Panel by PCR , Stool     Status: None   Collection Time: 10/31/21 10:05 PM   Specimen: Rectum; Stool  Result Value Ref Range Status   Campylobacter species NOT DETECTED NOT DETECTED Final   Plesimonas shigelloides NOT DETECTED NOT DETECTED Final   Salmonella species NOT DETECTED NOT DETECTED Final   Yersinia enterocolitica NOT DETECTED NOT DETECTED Final   Vibrio species  NOT DETECTED NOT DETECTED Final   Vibrio cholerae NOT DETECTED NOT DETECTED Final   Enteroaggregative E coli (EAEC) NOT DETECTED NOT DETECTED Final   Enteropathogenic E coli (EPEC) NOT DETECTED NOT DETECTED Final   Enterotoxigenic E coli (ETEC) NOT DETECTED NOT DETECTED Final   Shiga like toxin producing E coli (STEC) NOT DETECTED NOT DETECTED Final   Shigella/Enteroinvasive E coli (EIEC) NOT DETECTED NOT DETECTED Final   Cryptosporidium NOT DETECTED NOT DETECTED Final   Cyclospora cayetanensis NOT DETECTED NOT DETECTED Final   Entamoeba histolytica NOT DETECTED NOT DETECTED Final   Giardia lamblia NOT DETECTED NOT DETECTED Final   Adenovirus F40/41 NOT DETECTED NOT DETECTED Final   Astrovirus NOT DETECTED NOT DETECTED Final   Norovirus GI/GII NOT DETECTED NOT DETECTED Final   Rotavirus A NOT DETECTED NOT DETECTED Final   Sapovirus (I, II, IV, and V) NOT DETECTED NOT DETECTED Final    Comment: Performed at [facility], [address] [place], [place] [address]         Radiology Studies: No results found.      Scheduled Meds:  (feeding supplement) PROSource Plus  30 mL Oral TID BM   sodium chloride   Intravenous Once   vitamin C  500 mg Oral  BID   Chlorhexidine Gluconate Cloth  6 each Topical Daily   enoxaparin (LOVENOX) injection  0.5 mg/kg Subcutaneous Q24H   feeding supplement  237 mL Oral TID BM   fludrocortisone  0.1 mg Oral Daily   hydrocortisone sod succinate (SOLU-CORTEF) inj  50 mg Intravenous Q12H   Ipratropium-Albuterol  1 puff Inhalation Q6H   liver oil-zinc oxide   Topical TID   midodrine  10 mg Oral TID WC   multivitamin with minerals  1 tablet Oral Daily   mupirocin ointment  1 application Nasal BID   pantoprazole (PROTONIX) IV  40 mg Intravenous Daily   PARoxetine  30 mg Oral Daily   sodium chloride flush  10-40 mL Intracatheter Q12H   Vitamin D (Ergocalciferol)  50,000 Units Oral Q7 days   zinc sulfate  220 mg Oral Daily   Continuous Infusions:  sodium chloride 5 mL/hr at 10/26/21 1335     LOS: 11 days    Time spent: 25 minutes    Sidney Ace, MD [facility]   If 7PM-7AM, please contact night-coverage  11/03/2021, 11:26 AM

## 2021-11-04 LAB — BASIC METABOLIC PANEL
Anion gap: 7 (ref 5–15)
BUN: 61 mg/dL — ABNORMAL HIGH (ref 8–23)
CO2: 23 mmol/L (ref 22–32)
Calcium: 7.8 mg/dL — ABNORMAL LOW (ref 8.9–10.3)
Chloride: 109 mmol/L (ref 98–111)
Creatinine, Ser: 1.68 mg/dL — ABNORMAL HIGH (ref 0.44–1.00)
GFR, Estimated: 32 mL/min — ABNORMAL LOW (ref >60)
Glucose, Bld: 137 mg/dL — ABNORMAL HIGH (ref 70–99)
Potassium: 3.3 mmol/L — ABNORMAL LOW (ref 3.5–5.1)
Sodium: 139 mmol/L (ref 135–145)

## 2021-11-04 LAB — CBC
HCT: 22.4 % — ABNORMAL LOW (ref 36.0–46.0)
Hemoglobin: 7.8 g/dL — ABNORMAL LOW (ref 12.0–15.0)
MCH: 28.7 pg (ref 26.0–34.0)
MCHC: 34.8 g/dL (ref 30.0–36.0)
MCV: 82.4 fL (ref 80.0–100.0)
Platelets: 175 10*3/uL (ref 150–400)
RBC: 2.72 MIL/uL — ABNORMAL LOW (ref 3.87–5.11)
RDW: 19.4 % — ABNORMAL HIGH (ref 11.5–15.5)
WBC: 20.7 10*3/uL — ABNORMAL HIGH (ref 4.0–10.5)
nRBC: 0 % (ref 0.0–0.2)

## 2021-11-04 LAB — MAGNESIUM: Magnesium: 1.8 mg/dL (ref 1.7–2.4)

## 2021-11-04 MED ORDER — HYDROCORTISONE SOD SUC (PF) 100 MG IJ SOLR
50.0000 mg | Freq: Every day | INTRAMUSCULAR | Status: DC
Start: 2021-11-04 — End: 2021-11-05
  Administered 2021-11-04: 08:00:00 50 mg via INTRAVENOUS
  Filled 2021-11-04 (×2): qty 1

## 2021-11-04 MED ORDER — POTASSIUM CHLORIDE CRYS ER 20 MEQ PO TBCR
40.0000 meq | EXTENDED_RELEASE_TABLET | Freq: Once | ORAL | Status: AC
  Administered 2021-11-04: 40 meq via ORAL
  Filled 2021-11-04: qty 2

## 2021-11-04 NOTE — Progress Notes (Signed)
PROGRESS NOTE    Kayla Long  RCV:[id number] DOB: 03/03/1946 DOA: 10/23/2021 PCP: Pcp, No    Brief Narrative:  75 y.o. female with medical history significant of hypotension on midodrine, severe lymphedema, bedbound status, diastolic CHF, hypertension presented to the ED on morning of 10/23/2021 with reports by family of very poor p.o. intake and worsening confusion for the past 3 days.  EMS was called to the home and found patient's blood pressure 87/40.  Patient had been unable to take her midodrine.  Family reported patient recently having cough, very poor appetite with little p.o. intake but no apparent abdominal pain nausea or vomiting.  Family reported patient's mental status nowhere near baseline.  She is typically conversant, witty and quite interactive but has been more withdrawn.  Exposed to COVID-19 from a family member about a week prior.  Family reported patient had more oozing from her decubitus wounds recently.  Patient was transferred to stepdown unit for red mews secondary to tachycardia and hypotension.  Placed on midodrine, Florinef, stress dose steroids.  Blood pressure and heart rate improved.  Patient is remained stable.  Hypotension at baseline.  Stable for transfer back to medical telemetry on 12/15.  12/17: Discussed with infectious disease.  Patient had blood culture, positive for Enterobacter.  Speciation and sensitivities are pending.  Patient afebrile.   Assessment & Plan:   Principal Problem:   COVID-19 virus infection Active Problems:   Lymphedema of both lower extremities   IBS (irritable bowel syndrome)   Severe sepsis (HCC)   Acute metabolic encephalopathy   Anorexia   Acute on chronic anemia   Hypomagnesemia   Hypokalemia   Hypotension   Acute kidney injury superimposed on CKD (HCC)   CKD (chronic kidney disease), stage III (HCC)   Pressure injury of skin of buttock   Chronic diastolic CHF (congestive heart failure) (HCC)   Depression    Protein-calorie malnutrition, severe  Gram-negative bacteremia Blood culture positive from 12/13 Discussed with infectious disease Speciation and sensitivities pending Started on Zosyn empirically Elevated WBC on 12/17 Plan: Continue Zosyn Follow cultures for pathogen identification Monitor vitals and fever curve Daily labs  Sinus tachycardia Acute on chronic hypotension Tachycardia likely secondary to chronic illnesses and intravascular volume depletion No signs of acute infection Patient's p.o. intake has been poor, though improving Tachycardia improving Plan: Continue midodrine 10mg  3 times daily per home dose Discontinue Florinef Continue tapering Solu-Cortef, 50 mg IV daily with plans to discontinue tomorrow tomorrow No further IV albumin  Chronic lymphedema bilateral lower extremities Acute on chronic wounds Pressure injury to skin of buttock Extensive skin breakdown Per family fluid has been building up last 2 years Worsened with low albumin state BLE with hardened cobblestone texture and elephantiasis appearance Skin weeping noted Plan: Wound care reengaged Aldactone 50 mg p.o. daily No IV fluids   COVID-19 infection Severe sepsis secondary to [id number] Acute metabolic encephalopathy secondary to above Completed course of remdesivir AKI encephalopathy along with tachycardia, leukocytosis, hypotension Lactic acid normal, patient not in shock Sepsis physiology appears to be improving Weaning oral vasopressors Mental status improved  Acute kidney injury on chronic kidney disease stage IIIa Creatinine on presentation 1.48 Baseline 1.1 IVF discontinued Encourage oral hydration  Acute on chronic anemia 2/2 blood loss - baseline Hbg 7.7 three months ago, presented with Hbg 6.5.  Family report increased blood from wounds recently, so likely acute on chronic blood loss. --s/p 3u pRBC Plan: --monitor Hgb and transfuse to keep Hgb >7  Anorexia / Failure to  thrive  Severe hypoalbuminemia  Severe Malnutrition Dysphagia --albumin low around 1.5 since Aug 2022. --noted to have reduced oral intake and difficulty swallowing meat.  SLP eval rec Dys 1 diet.   --Upgraded to Dys 2 per pt and family request for more food options. --Unfortunately RN noted that patient continued to have overt aspiration on dysphagia 2 diet.  At this time unsafe to progress diet beyond this point.  Patient remains a full code and given her tenuous state a aspiration event could prove disastrous. --pt refused tube feed Plan: Continue dysphagia 1 diet for now RD following for dietary supplementation   Hypokalemia / Hypomagnesemia   Hypophos --monitor and replete PRN   Chronic diastolic CHF  Echo 06/3150 in Care Everywhere EF >55%, grade II diastolic dysfunction.     IBS (irritable bowel syndrome)  - report as chronic issue. --home Senna-S PRN continued   Depression  - continue home Paxil   Morbid obesity, BMI 69.74   DVT prophylaxis: SQ Lovenox Code Status: Full code Family Communication:Daughter [name] at bedside 12/14, patient's friend at bedside 12/16, via phone 12/17 [phone] Disposition Plan: Status is: Inpatient  Remains inpatient appropriate because: Hypotension improving.  Possible gram-negative bacteremia.  Pending sensitivities.  On IV antibiotics.  Possible discharge in 24 to 48 hours.       Level of care: Telemetry Medical  Consultants:  None.  Nephrology and palliative care were following.  Now signed off  Procedures:  None  Antimicrobials: None   Subjective: Seen and examined.  Mentating clearly.  Seems to be eating more with assistance.  Objective: Vitals:   11/03/21 1939 11/04/21 0029 11/04/21 0633 11/04/21 0740  BP: (!) 110/58 (!) 118/54 (!) 114/56 (!) 111/54  Pulse: (!) 109 (!) 108 (!) 109 (!) 104  Resp: 19 20 20 20   Temp:  98.6 F (37 C) 97.9 F (36.6 C) 98.2 F (36.8 C)  TempSrc:      SpO2: 100% 98% 99% 99%   Weight:        Intake/Output Summary (Last 24 hours) at 11/04/2021 1032 Last data filed at 11/04/2021 0700 Gross per 24 hour  Intake 190.43 ml  Output 900 ml  Net -709.57 ml   Filed Weights   10/23/21 1354 11/02/21 0344  Weight: (!) 196 kg (!) 194 kg    Examination:  General exam: No acute distress.  Mentating clearly.  Frail Respiratory system: Cough.  Lungs clear.  Normal work of breathing.  Room air Cardiovascular system: S1-S2, RRR, no murmurs, marked nonpitting edema BLE Gastrointestinal system: Soft, NT/ND, normal bowel sounds Central nervous system: Alert and oriented. No focal neurological deficits. Extremities: Marked BLE nonpitting edema, elephantiasis appearing, marked skin breakdown Skin: No rashes, lesions or ulcers Psychiatry: Judgement and insight appear impaired. Mood & affect flattened.     Data Reviewed: I have personally reviewed following labs and imaging studies  CBC: Recent Labs  Lab 10/31/21 0600 11/01/21 0239 11/02/21 0355 11/03/21 0432 11/04/21 0828  WBC 13.1* 10.9* 15.3* 12.7* 20.7*  HGB 8.2* 8.2* 8.4* 8.0* 7.8*  HCT 23.6* 24.6* 24.5* 23.4* 22.4*  MCV 82.8 83.4 83.6 81.8 82.4  PLT 139* 149* 162 174 626   Basic Metabolic Panel: Recent Labs  Lab 10/29/21 0558 10/30/21 0842 10/31/21 0600 11/01/21 0239 11/02/21 0355 11/03/21 0432 11/04/21 0828  NA 142 138 137 139 138 136 139  K 3.2* 3.6 3.8 3.8 3.6 3.5 3.3*  CL 111 109 110 110 108 107 109  CO2 23 21* 23 23 22 22 23   GLUCOSE 90 99 99 143* 136* 111* 137*  BUN 33* 37* 43* 45* 54* 64* 61*  CREATININE 1.58* 1.66* 1.81* 1.76* 1.81* 1.71* 1.68*  CALCIUM 7.6* 7.8* 7.6* 8.0* 8.0* 7.9* 7.8*  MG 1.7 1.7 1.7 2.0 1.7 2.0 1.8  PHOS 2.3* 2.8 3.2  --   --   --   --    GFR: Estimated Creatinine Clearance: 53.3 mL/min (A) (by C-G formula based on SCr of 1.68 mg/dL (H)). Liver Function Tests: No results for input(s): AST, ALT, ALKPHOS, BILITOT, PROT, ALBUMIN in the last 168 hours. No results  for input(s): LIPASE, AMYLASE in the last 168 hours. No results for input(s): AMMONIA in the last 168 hours. Coagulation Profile: No results for input(s): INR, PROTIME in the last 168 hours. Cardiac Enzymes: No results for input(s): CKTOTAL, CKMB, CKMBINDEX, TROPONINI in the last 168 hours. BNP (last 3 results) No results for input(s): PROBNP in the last 8760 hours. HbA1C: No results for input(s): HGBA1C in the last 72 hours. CBG: Recent Labs  Lab 10/29/21 0848 10/29/21 1727 10/31/21 1212  GLUCAP 78 114* 92   Lipid Profile: No results for input(s): CHOL, HDL, LDLCALC, TRIG, CHOLHDL, LDLDIRECT in the last 72 hours. Thyroid Function Tests: No results for input(s): TSH, T4TOTAL, FREET4, T3FREE, THYROIDAB in the last 72 hours. Anemia Panel: No results for input(s): VITAMINB12, FOLATE, FERRITIN, TIBC, IRON, RETICCTPCT in the last 72 hours. Sepsis Labs: No results for input(s): PROCALCITON, LATICACIDVEN in the last 168 hours.   Recent Results (from the past 240 hour(s))  Culture, blood (Routine X 2) w Reflex to ID Panel     Status: None (Preliminary result)   Collection Time: 10/31/21  9:30 AM   Specimen: BLOOD LEFT HAND  Result Value Ref Range Status   Specimen Description BLOOD LEFT HAND  Final   Special Requests   Final    BOTTLES DRAWN AEROBIC AND ANAEROBIC Blood Culture adequate volume   Culture   Final    NO GROWTH 4 DAYS Performed at [facility], [address], [place], [place] [address]    Report Status PENDING  Incomplete  Culture, blood (Routine X 2) w Reflex to ID Panel     Status: None (Preliminary result)   Collection Time: 10/31/21  9:30 AM   Specimen: BLOOD LEFT WRIST  Result Value Ref Range Status   Specimen Description BLOOD LEFT WRIST  Final   Special Requests   Final    BOTTLES DRAWN AEROBIC AND ANAEROBIC Blood Culture adequate volume   Culture  Setup Time   Final    AEROBIC BOTTLE ONLY GRAM NEGATIVE RODS Organism ID to follow CRITICAL  RESULT CALLED TO, READ BACK BY AND VERIFIED WITH: ABBY ELLINGTON @1311  11/03/21 MJU Performed at [facility], [address] [place], [place] [address]    Culture GRAM NEGATIVE RODS  Final   Report Status PENDING  Incomplete  Blood Culture ID Panel (Reflexed)     Status: Abnormal   Collection Time: 10/31/21  9:30 AM  Result Value Ref Range Status   Enterococcus faecalis NOT DETECTED NOT DETECTED Final   Enterococcus Faecium NOT DETECTED NOT DETECTED Final   Listeria monocytogenes NOT DETECTED NOT DETECTED Final   Staphylococcus species NOT DETECTED NOT DETECTED Final   Staphylococcus aureus (BCID) NOT DETECTED NOT DETECTED Final   Staphylococcus epidermidis NOT DETECTED NOT DETECTED Final   Staphylococcus lugdunensis NOT DETECTED NOT DETECTED Final   Streptococcus species  NOT DETECTED NOT DETECTED Final   Streptococcus agalactiae NOT DETECTED NOT DETECTED Final   Streptococcus pneumoniae NOT DETECTED NOT DETECTED Final   Streptococcus pyogenes NOT DETECTED NOT DETECTED Final   A.calcoaceticus-baumannii NOT DETECTED NOT DETECTED Final   Bacteroides fragilis NOT DETECTED NOT DETECTED Final   Enterobacterales DETECTED (A) NOT DETECTED Final    Comment: Enterobacterales represent a large order of gram negative bacteria, not a single organism. Refer to culture for further identification. CRITICAL RESULT CALLED TO, READ BACK BY AND VERIFIED WITH: ABBY ELLINGTON @1311  11/03/21 MJU    Enterobacter cloacae complex NOT DETECTED NOT DETECTED Final   Escherichia coli NOT DETECTED NOT DETECTED Final   Klebsiella aerogenes NOT DETECTED NOT DETECTED Final   Klebsiella oxytoca NOT DETECTED NOT DETECTED Final   Klebsiella pneumoniae NOT DETECTED NOT DETECTED Final   Proteus species NOT DETECTED NOT DETECTED Final   Salmonella species NOT DETECTED NOT DETECTED Final   Serratia marcescens NOT DETECTED NOT DETECTED Final   Haemophilus influenzae NOT DETECTED NOT DETECTED Final   Neisseria  meningitidis NOT DETECTED NOT DETECTED Final   Pseudomonas aeruginosa NOT DETECTED NOT DETECTED Final   Stenotrophomonas maltophilia NOT DETECTED NOT DETECTED Final   Candida albicans NOT DETECTED NOT DETECTED Final   Candida auris NOT DETECTED NOT DETECTED Final   Candida glabrata NOT DETECTED NOT DETECTED Final   Candida krusei NOT DETECTED NOT DETECTED Final   Candida parapsilosis NOT DETECTED NOT DETECTED Final   Candida tropicalis NOT DETECTED NOT DETECTED Final   Cryptococcus neoformans/gattii NOT DETECTED NOT DETECTED Final   CTX-M ESBL NOT DETECTED NOT DETECTED Final   Carbapenem resistance IMP NOT DETECTED NOT DETECTED Final   Carbapenem resistance KPC NOT DETECTED NOT DETECTED Final   Carbapenem resistance NDM NOT DETECTED NOT DETECTED Final   Carbapenem resist OXA 48 LIKE NOT DETECTED NOT DETECTED Final   Carbapenem resistance VIM NOT DETECTED NOT DETECTED Final    Comment: Performed at [facility], [address] [place], [place] [address]  MRSA Next Gen by PCR, Nasal     Status: Abnormal   Collection Time: 10/31/21 12:15 PM   Specimen: Nasal Mucosa; Nasal Swab  Result Value Ref Range Status   MRSA by PCR Next Gen DETECTED (A) NOT DETECTED Final    Comment: RESULT CALLED TO, READ BACK BY AND VERIFIED WITH: BROOKE DARNEL 10/31/21 1357 MU (NOTE) The GeneXpert MRSA Assay (FDA approved for NASAL specimens only), is one component of a comprehensive MRSA colonization surveillance program. It is not intended to diagnose MRSA infection nor to guide or monitor treatment for MRSA infections. Test performance is not FDA approved in patients less than 45 years old. Performed at [facility], [address] [place],  [address]   Gastrointestinal Panel by PCR , Stool     Status: None   Collection Time: 10/31/21 10:05 PM   Specimen: Rectum; Stool  Result Value Ref Range Status   Campylobacter species NOT DETECTED NOT DETECTED Final   Plesimonas  shigelloides NOT DETECTED NOT DETECTED Final   Salmonella species NOT DETECTED NOT DETECTED Final   Yersinia enterocolitica NOT DETECTED NOT DETECTED Final   Vibrio species NOT DETECTED NOT DETECTED Final   Vibrio cholerae NOT DETECTED NOT DETECTED Final   Enteroaggregative E coli (EAEC) NOT DETECTED NOT DETECTED Final   Enteropathogenic E coli (EPEC) NOT DETECTED NOT DETECTED Final   Enterotoxigenic E coli (ETEC) NOT DETECTED NOT DETECTED Final   Shiga like toxin producing E coli (  STEC) NOT DETECTED NOT DETECTED Final   Shigella/Enteroinvasive E coli (EIEC) NOT DETECTED NOT DETECTED Final   Cryptosporidium NOT DETECTED NOT DETECTED Final   Cyclospora cayetanensis NOT DETECTED NOT DETECTED Final   Entamoeba histolytica NOT DETECTED NOT DETECTED Final   Giardia lamblia NOT DETECTED NOT DETECTED Final   Adenovirus F40/41 NOT DETECTED NOT DETECTED Final   Astrovirus NOT DETECTED NOT DETECTED Final   Norovirus GI/GII NOT DETECTED NOT DETECTED Final   Rotavirus A NOT DETECTED NOT DETECTED Final   Sapovirus (I, II, IV, and V) NOT DETECTED NOT DETECTED Final    Comment: Performed at [facility], [address] [place], [place] [address]  Aerobic Culture w Gram Stain (superficial specimen)     Status: None (Preliminary result)   Collection Time: 11/03/21  1:49 PM   Specimen: Back; Wound  Result Value Ref Range Status   Specimen Description   Final    BACK Performed at [facility], [address] [place], [place] [address]    Special Requests   Final    NONE Performed at [facility], [address] [place], [place] [address]    Gram Stain   Final    FEW WBC PRESENT, PREDOMINANTLY MONONUCLEAR MODERATE GRAM POSITIVE COCCI FEW GRAM NEGATIVE RODS FEW GRAM POSITIVE RODS RARE YEAST Performed at [facility], [place] [address] [place], [place] [address]    Culture PENDING  Incomplete   Report Status PENDING  Incomplete         Radiology  Studies: DG Chest Port 1 View  Result Date: 11/03/2021 CLINICAL DATA:  Pneumonia EXAM: PORTABLE CHEST 1 VIEW COMPARISON:  10/23/2021 FINDINGS: Hypoventilatory changes. Subsegmental atelectasis left base. Enlarged cardiomediastinal silhouette with bronchovascular crowding. Patchy opacity right base. Right hilar rounded opacity. No pleural effusion or pneumothorax. Right upper extremity central venous catheter tip over the distal SVC. IMPRESSION: 1. Low lung volumes with hypoventilatory change and subsegmental atelectasis left base. 2. Patchy opacity right base, favor atelectasis. Rounded right infrahilar opacity, could be secondary to vascular summation though airspace consolidation also possible. Recommend two-view chest radiograph follow-up. Electronically Signed   By: Donavan Foil M.D.   On: 11/03/2021 16:14        Scheduled Meds:  (feeding supplement) PROSource Plus  30 mL Oral TID BM   sodium chloride   Intravenous Once   vitamin C  500 mg Oral BID   Chlorhexidine Gluconate Cloth  6 each Topical Daily   enoxaparin (LOVENOX) injection  0.5 mg/kg Subcutaneous Q24H   feeding supplement  237 mL Oral TID BM   hydrocortisone sod succinate (SOLU-CORTEF) inj  50 mg Intravenous Daily   Ipratropium-Albuterol  1 puff Inhalation Q6H   liver oil-zinc oxide   Topical TID   midodrine  10 mg Oral TID WC   multivitamin with minerals  1 tablet Oral Daily   mupirocin ointment  1 application Nasal BID   pantoprazole (PROTONIX) IV  40 mg Intravenous Daily   PARoxetine  30 mg Oral Daily   sodium chloride flush  10-40 mL Intracatheter Q12H   Vitamin D (Ergocalciferol)  50,000 Units Oral Q7 days   zinc sulfate  220 mg Oral Daily   Continuous Infusions:  sodium chloride 5 mL/hr at 10/26/21 1335   piperacillin-tazobactam (ZOSYN)  IV 12.5 mL/hr at 11/04/21 0250     LOS: 12 days    Time spent: 25 minutes    Sidney Ace, MD [facility]   If 7PM-7AM,  please contact  night-coverage  11/04/2021, 10:32 AM

## 2021-11-05 LAB — CBC WITH DIFFERENTIAL/PLATELET
Abs Immature Granulocytes: 0.27 10*3/uL — ABNORMAL HIGH (ref 0.00–0.07)
Basophils Absolute: 0 10*3/uL (ref 0.0–0.1)
Basophils Relative: 0 %
Eosinophils Absolute: 0 10*3/uL (ref 0.0–0.5)
Eosinophils Relative: 0 %
HCT: 23.7 % — ABNORMAL LOW (ref 36.0–46.0)
Hemoglobin: 8.1 g/dL — ABNORMAL LOW (ref 12.0–15.0)
Immature Granulocytes: 2 %
Lymphocytes Relative: 7 %
Lymphs Abs: 1.1 10*3/uL (ref 0.7–4.0)
MCH: 28.5 pg (ref 26.0–34.0)
MCHC: 34.2 g/dL (ref 30.0–36.0)
MCV: 83.5 fL (ref 80.0–100.0)
Monocytes Absolute: 1.1 10*3/uL — ABNORMAL HIGH (ref 0.1–1.0)
Monocytes Relative: 7 %
Neutro Abs: 13.1 10*3/uL — ABNORMAL HIGH (ref 1.7–7.7)
Neutrophils Relative %: 84 %
Platelets: 210 10*3/uL (ref 150–400)
RBC: 2.84 MIL/uL — ABNORMAL LOW (ref 3.87–5.11)
RDW: 19.7 % — ABNORMAL HIGH (ref 11.5–15.5)
WBC: 15.6 10*3/uL — ABNORMAL HIGH (ref 4.0–10.5)
nRBC: 0 % (ref 0.0–0.2)

## 2021-11-05 LAB — BASIC METABOLIC PANEL
Anion gap: 8 (ref 5–15)
BUN: 66 mg/dL — ABNORMAL HIGH (ref 8–23)
CO2: 22 mmol/L (ref 22–32)
Calcium: 8.1 mg/dL — ABNORMAL LOW (ref 8.9–10.3)
Chloride: 108 mmol/L (ref 98–111)
Creatinine, Ser: 1.67 mg/dL — ABNORMAL HIGH (ref 0.44–1.00)
GFR, Estimated: 32 mL/min — ABNORMAL LOW (ref >60)
Glucose, Bld: 102 mg/dL — ABNORMAL HIGH (ref 70–99)
Potassium: 3.2 mmol/L — ABNORMAL LOW (ref 3.5–5.1)
Sodium: 138 mmol/L (ref 135–145)

## 2021-11-05 LAB — CULTURE, BLOOD (ROUTINE X 2)
Culture: NO GROWTH
Special Requests: ADEQUATE

## 2021-11-05 MED ORDER — PANTOPRAZOLE SODIUM 40 MG PO TBEC
40.0000 mg | DELAYED_RELEASE_TABLET | Freq: Every day | ORAL | Status: DC
  Administered 2021-11-06: 09:00:00 40 mg via ORAL
  Filled 2021-11-05: qty 1

## 2021-11-05 NOTE — TOC Progression Note (Addendum)
Transition of Care [facility]) - Progression Note    Patient Details  Name: BAY JARQUIN MRN: [id number] Date of Birth: [birth year]  Transition of Care [facility]) CM/SW [place], LCSW Phone Number: 11/05/2021, 2:25 PM  Clinical Narrative:   labs still pending MD anticipates dc tomorrow. [facility] will service [facility], Malachy Mood with [facility] is aware of dc tomorrow.    Expected Discharge Plan:  (to be determined) Barriers to Discharge: No Barriers Identified  Expected Discharge Plan and Services Expected Discharge Plan:  (to be determined)   Discharge Planning Services: CM Consult   Living arrangements for the past 2 months: Single Family Home                 DME Arranged: N/A DME Agency: NA       HH Arranged: RN [place] Agency: [place]         Social Determinants of Health (SDOH) Interventions    Readmission Risk Interventions Readmission Risk Prevention Plan 10/24/2021  Transportation Screening Complete  PCP or Specialist Appt within 3-5 Days Complete  HRI or Home Care Consult Complete  Social Work Consult for [place] Planning/Counseling Complete  Palliative Care Screening Complete  Medication Review Press photographer) Complete  Some recent data might be hidden

## 2021-11-05 NOTE — TOC Transition Note (Signed)
Transition of Care [facility]) - CM/SW Discharge Note   Patient Details  Name: Kayla Long MRN: [id number] Date of Birth: [birth year]  Transition of Care [facility]) CM/SW Contact:  Eileen Stanford, LCSW Phone Number: 11/05/2021, 10:14 AM   Clinical Narrative:   HH RN is arranged via [facility].    Final next level of care: [place] Barriers to Discharge: No Barriers Identified   Patient Goals and CMS Choice Patient states their goals for this hospitalization and ongoing recovery are:: Palliative following for [place] CMS Medicare.gov Compare Post Acute Care list provided to:: Patient Represenative (must comment) Choice offered to / list presented to : Adult Children  Discharge Placement                    Patient and family notified of of transfer: 11/05/21  Discharge Plan and Services   Discharge Planning Services: CM Consult            DME Arranged: N/A DME Agency: NA       HH Arranged: RN [place] Agency: [place]        Social Determinants of Health (SDOH) Interventions     Readmission Risk Interventions Readmission Risk Prevention Plan 10/24/2021  Transportation Screening Complete  PCP or Specialist Appt within 3-5 Days Complete  HRI or [place] Complete  Social Work Consult for [place] Planning/Counseling Complete  Palliative Care Screening Complete  Medication Review Press photographer) Complete  Some recent data might be hidden

## 2021-11-05 NOTE — Progress Notes (Signed)
PHARMACIST - PHYSICIAN COMMUNICATION  CONCERNING: IV to Oral Route Change Policy  RECOMMENDATION: This patient is receiving pantoprazole by the intravenous route.  Based on criteria approved by the Pharmacy and Therapeutics Committee, the intravenous medication(s) is/are being converted to the equivalent oral dose form(s).   DESCRIPTION: These criteria include: The patient is eating (either orally or via tube) and/or has been taking other orally administered medications for a least 24 hours The patient has no evidence of active gastrointestinal bleeding or impaired GI absorption (gastrectomy, short bowel, patient on TNA or NPO).  If you have questions about this conversion, please contact the [place] 11/05/2021 1:16 PM

## 2021-11-06 LAB — CULTURE, BLOOD (ROUTINE X 2): Special Requests: ADEQUATE

## 2021-11-06 MED ORDER — MIDODRINE HCL 10 MG PO TABS: 10.0000 mg | ORAL_TABLET | Freq: Three times a day (TID) | ORAL | 0 refills | Status: DC | PRN

## 2021-11-06 MED ORDER — ALBUTEROL SULFATE HFA 108 (90 BASE) MCG/ACT IN AERS: 2.0000 | INHALATION_SPRAY | Freq: Four times a day (QID) | RESPIRATORY_TRACT | 0 refills | Status: DC | PRN

## 2021-11-06 MED ORDER — LEVOFLOXACIN 750 MG PO TABS
750.0000 mg | ORAL_TABLET | ORAL | Status: DC
  Administered 2021-11-06: 13:00:00 750 mg via ORAL
  Filled 2021-11-06: qty 1

## 2021-11-06 MED ORDER — LEVOFLOXACIN 750 MG PO TABS: 750.0000 mg | ORAL_TABLET | ORAL | 0 refills | Status: AC

## 2021-11-06 NOTE — Progress Notes (Signed)
Patient is being discharged home to daughters care. EMS will be set up for discharge. Discharge instruction discussed with patient and daughter.  Then placed in EMS packet

## 2021-11-06 NOTE — TOC Transition Note (Signed)
Transition of Care [facility]) - CM/SW Discharge Note   Patient Details  Name: ZIAN MOHAMED MRN: [id number] Date of Birth: Mar 26, 1946  Transition of Care [facility]) CM/SW Contact:  Pete Pelt, RN Phone Number: 11/06/2021, 10:26 AM   Clinical Narrative:   Patient will be discharging home today via EMS.  TOC spoke with patient and daughter confirmed address of [place] [address] is the transport destination.  Daughter at bedside, states they do not anticipate needs at home.  Confirmed [facility] will accept care for patient with Malachy Mood from agency.    Final next level of care: [place] Barriers to Discharge: No Barriers Identified   Patient Goals and CMS Choice Patient states their goals for this hospitalization and ongoing recovery are:: Palliative following for [place] CMS Medicare.gov Compare Post Acute Care list provided to:: Patient Represenative (must comment) Choice offered to / list presented to : Adult Children  Discharge Placement                    Patient and family notified of of transfer: 11/05/21  Discharge Plan and Services   Discharge Planning Services: CM Consult            DME Arranged: N/A DME Agency: NA       HH Arranged: RN [place] Agency: [place]        Social Determinants of Health (SDOH) Interventions     Readmission Risk Interventions Readmission Risk Prevention Plan 10/24/2021  Transportation Screening Complete  PCP or Specialist Appt within 3-5 Days Complete  HRI or [place] Complete  Social Work Consult for [place] Planning/Counseling Complete  Palliative Care Screening Complete  Medication Review Press photographer) Complete  Some recent data might be hidden

## 2021-11-06 NOTE — Discharge Summary (Signed)
Physician Discharge Summary  Kayla Long QAS:[id number] DOB: 03/25/1946 DOA: 10/23/2021  PCP: Merryl Hacker, No  Admit date: 10/23/2021 Discharge date: 11/06/2021  Admitted From: Home Disposition: Home with home health  Recommendations for Outpatient Follow-up:  Follow up with PCP in 1-2 weeks Home health wound care  Home Health: Yes, RN for wound care Equipment/Devices: None  Discharge Condition: Stable CODE STATUS: Full Diet recommendation: Dysphagia 1/pure  Brief/Interim Summary:  75 y.o. female with medical history significant of hypotension on midodrine, severe lymphedema, bedbound status, diastolic CHF, hypertension presented to the ED on morning of 10/23/2021 with reports by family of very poor p.o. intake and worsening confusion for the past 3 days.  EMS was called to the home and found patient's blood pressure 87/40.  Patient had been unable to take her midodrine.  Family reported patient recently having cough, very poor appetite with little p.o. intake but no apparent abdominal pain nausea or vomiting.  Family reported patient's mental status nowhere near baseline.  She is typically conversant, witty and quite interactive but has been more withdrawn.  Exposed to COVID-19 from a family member about a week prior.  Family reported patient had more oozing from her decubitus wounds recently.   Patient was transferred to stepdown unit for red mews secondary to tachycardia and hypotension.  Placed on midodrine, Florinef, stress dose steroids.  Blood pressure and heart rate improved.   Patient is remained stable.  Hypotension at baseline.  Stable for transfer back to medical telemetry on 12/15.   12/17: Discussed with infectious disease.  Patient had blood culture, positive for Enterobacter.  Speciation and sensitivities are pending.  Patient afebrile.  12/18: Blood culture with Citrobacter.  Pansensitive.  Transition to Levaquin.  Complete oral antibiotic course.  Stable for discharge home  at this time.  Home health wound care ordered.  Discharge plan discussed with daughter at bedside.   Discharge Diagnoses:  Principal Problem:   COVID-19 virus infection Active Problems:   Lymphedema of both lower extremities   IBS (irritable bowel syndrome)   Severe sepsis (HCC)   Acute metabolic encephalopathy   Anorexia   Acute on chronic anemia   Hypomagnesemia   Hypokalemia   Hypotension   Acute kidney injury superimposed on CKD (HCC)   CKD (chronic kidney disease), stage III (HCC)   Pressure injury of skin of buttock   Chronic diastolic CHF (congestive heart failure) ([place])   Depression   Protein-calorie malnutrition, severe  Gram-negative bacteremia Blood culture positive from 12/13 Discussed with infectious disease Citrobacter, pansensitive Started on Zosyn empirically Elevated WBC on 12/17, improved Plan: Transition to p.o. Levaquin.  Discharge home.  Complete total 10-day antibiotic course.  Outpatient follow-up PCP   Sinus tachycardia Acute on chronic hypotension Tachycardia likely secondary to chronic illnesses and intravascular volume depletion No signs of acute infection Patient's p.o. intake has been poor, though improving Tachycardia improving Plan: Discharge home.  DC Florinef and Solu-Cortef.  Continue midodrine, 10 mg 3 times daily scheduled.  Discussed discharge plan with daughter at bedside.   Chronic lymphedema bilateral lower extremities Acute on chronic wounds Pressure injury to skin of buttock Extensive skin breakdown Per family fluid has been building up last 2 years Worsened with low albumin state BLE with hardened cobblestone texture and elephantiasis appearance Skin weeping noted Plan: Discharge home with home wound care   COVID-19 infection Severe sepsis secondary to [id number] Acute metabolic encephalopathy secondary to above Completed course of remdesivir AKI encephalopathy along with tachycardia, leukocytosis, hypotension Lactic  acid normal, patient not in shock Sepsis physiology appears to be improving Weaning oral vasopressors Mental status improved   Acute kidney injury on chronic kidney disease stage IIIa Creatinine on presentation 1.48 Baseline 1.1 IVF discontinued Encourage oral hydration   Acute on chronic anemia 2/2 blood loss - baseline Hbg 7.7 three months ago, presented with Hbg 6.5.  Family report increased blood from wounds recently, so likely acute on chronic blood loss. --s/p 3u pRBC Plan: --monitor Hgb and transfuse to keep Hgb >7 Hemoglobin stable at time of discharge.  Okay for discharge home   Anorexia / Failure to thrive  Severe hypoalbuminemia  Severe Malnutrition Dysphagia --albumin low around 1.5 since Aug 2022. --noted to have reduced oral intake and difficulty swallowing meat.  SLP eval rec Dys 1 diet.   --Upgraded to Dys 2 per pt and family request for more food options. --Unfortunately RN noted that patient continued to have overt aspiration on dysphagia 2 diet.  At this time unsafe to progress diet beyond this point.  Patient remains a full code and given her tenuous state a aspiration event could prove disastrous. --pt refused tube feed Plan: Discharge home.  Recommend continue pured diet at home as patient is a high aspiration risk.  Recommend 3 times daily protein supplements     Discharge Instructions  Discharge Instructions     Diet - low sodium heart healthy   Complete by: As directed    Increase activity slowly   Complete by: As directed    No wound care   Complete by: As directed       Allergies as of 11/06/2021       Reactions   Fluoxetine Hcl    REACTION: headache        Medication List     STOP taking these medications    folic acid 1 MG tablet Commonly known as: FOLVITE   iron polysaccharides 150 MG capsule Commonly known as: NIFEREX       TAKE these medications    albuterol 108 (90 Base) MCG/ACT inhaler Commonly known as:  VENTOLIN HFA Inhale 2 puffs into the lungs every 6 (six) hours as needed for wheezing or shortness of breath.   furosemide 40 MG tablet Commonly known as: LASIX Take 80 mg by mouth daily.   levofloxacin 750 MG tablet Commonly known as: LEVAQUIN Take 1 tablet (750 mg total) by mouth every other day for 6 days.   midodrine 10 MG tablet Commonly known as: PROAMATINE Take 1 tablet (10 mg total) by mouth 3 (three) times daily as needed (for SBP <100).   Oxycodone HCl 10 MG Tabs Take 10 mg by mouth 3 (three) times daily as needed (severe pain).   pantoprazole 40 MG tablet Commonly known as: PROTONIX Take 1 tablet (40 mg total) by mouth daily before breakfast.   PARoxetine 30 MG tablet Commonly known as: PAXIL Take 30 mg by mouth daily.   senna-docusate 8.6-50 MG tablet Commonly known as: Senokot-S Take 1 tablet by mouth at bedtime as needed for moderate constipation.   SSD 1 % cream Generic drug: silver sulfADIAZINE Apply 1 application topically daily. (Apply to cracks and affected areas of skin)        Allergies  Allergen Reactions   Fluoxetine Hcl     REACTION: headache    Consultations: Palliative care   Procedures/Studies: DG Chest 2 View  Result Date: 10/23/2021 CLINICAL DATA:  Cough EXAM: CHEST - 2 VIEW COMPARISON:  06/30/2021 FINDINGS: Degraded lateral  view secondary to arm position. AP portable radiograph is mildly oblique. Numerous leads and wires project over the chest. Borderline cardiomegaly. No pleural effusion or pneumothorax. No overt congestive failure. Low lung volumes with resultant pulmonary interstitial prominence. Bibasilar atelectasis. Right shoulder arthroplasty. IMPRESSION: Low lung volumes with bibasilar atelectasis.  No acute findings. Both views are mildly position and technique degraded. Electronically Signed   By: Abigail Miyamoto M.D.   On: 10/23/2021 09:19   CT Head Wo Contrast  Result Date: 10/23/2021 CLINICAL DATA:  Mental status change,  unknown cause. Additional history provided: Poor p.o. intake for 3 days, blood pressure 87/40. EXAM: CT HEAD WITHOUT CONTRAST TECHNIQUE: Contiguous axial images were obtained from the base of the skull through the vertex without intravenous contrast. COMPARISON:  Report from brain MRI 07/13/1999 (images unavailable). FINDINGS: Brain: Mild generalized cerebral atrophy. Minimal patchy hypoattenuation within the cerebral white matter, nonspecific but compatible chronic small vessel ischemic disease. Rounded dural-based calcification overlying the anterior left frontal lobe measuring 4 mm, nonspecific but possibly reflecting a tiny calcified meningioma. There is no acute intracranial hemorrhage. No demarcated cortical infarct. No extra-axial fluid collection. No midline shift. Partially empty sella turcica. Vascular: Hyperdense vessel.  Atherosclerotic calcifications. Skull: Normal. Negative for fracture or focal lesion. Sinuses/Orbits: Visualized orbits show no acute finding. No significant paranasal sinus disease at the imaged levels. IMPRESSION: No evidence of acute infarct or acute intracranial hemorrhage. Minimal chronic small-vessel ischemic changes within the cerebral white matter. 4 mm rounded dural-based calcification overlying the anterior left frontal lobe, nonspecific but possibly reflecting a tiny incidental calcified meningioma. Mild generalized cerebral atrophy. Electronically Signed   By: Kellie Simmering D.O.   On: 10/23/2021 09:24   DG Chest Port 1 View  Result Date: 11/03/2021 CLINICAL DATA:  Pneumonia EXAM: PORTABLE CHEST 1 VIEW COMPARISON:  10/23/2021 FINDINGS: Hypoventilatory changes. Subsegmental atelectasis left base. Enlarged cardiomediastinal silhouette with bronchovascular crowding. Patchy opacity right base. Right hilar rounded opacity. No pleural effusion or pneumothorax. Right upper extremity central venous catheter tip over the distal SVC. IMPRESSION: 1. Low lung volumes with  hypoventilatory change and subsegmental atelectasis left base. 2. Patchy opacity right base, favor atelectasis. Rounded right infrahilar opacity, could be secondary to vascular summation though airspace consolidation also possible. Recommend two-view chest radiograph follow-up. Electronically Signed   By: Donavan Foil M.D.   On: 11/03/2021 16:14   DG Abd Portable 1V  Result Date: 10/31/2021 CLINICAL DATA:  Diarrhea. EXAM: PORTABLE ABDOMEN - 1 VIEW COMPARISON:  CT dated 08/27/2008. FINDINGS: No bowel dilatation or evidence of obstruction. No free air. Indeterminate 15 mm rounded calcific focus in the left upper abdomen. Degenerative changes of the spine. No acute osseous pathology. IMPRESSION: No bowel dilatation or evidence of obstruction. Electronically Signed   By: Anner Crete M.D.   On: 10/31/2021 23:40   [place] EKG SITE RITE  Result Date: 10/23/2021 If Site Rite image not attached, placement could not be confirmed due to current cardiac rhythm.     Subjective: Seen and examined on day of discharge.  Mentating clearly.  Stable for discharge home  Discharge Exam: Vitals:   11/06/21 0804 11/06/21 0821  BP: (!) 109/53   Pulse: (!) 114 (!) 101  Resp: 16   Temp: 98.3 F (36.8 C)   SpO2: 100%    Vitals:   11/05/21 2026 11/06/21 0353 11/06/21 0804 11/06/21 0821  BP: (!) 113/48 (!) 109/48 (!) 109/53   Pulse: (!) 101 (!) 111 (!) 114 (!) 101  Resp: 20  18 16   Temp: 98.1 F (36.7 C) 98 F (36.7 C) 98.3 F (36.8 C)   TempSrc:  Oral Oral   SpO2: 100% 100% 100%   Weight:        General: Pt is alert, awake, not in acute distress Cardiovascular: Tachycardic, regular rhythm, no murmurs Respiratory: Scattered crackles.  Normal work of breathing.  Room air Abdominal: Soft, NT, ND, bowel sounds + Extremities: Marked bilateral lower extremity lymphedema    The results of significant diagnostics from this hospitalization (including imaging, microbiology, ancillary and laboratory) are  listed below for reference.     Microbiology: Recent Results (from the past 240 hour(s))  Culture, blood (Routine X 2) w Reflex to ID Panel     Status: None   Collection Time: 10/31/21  9:30 AM   Specimen: BLOOD LEFT HAND  Result Value Ref Range Status   Specimen Description BLOOD LEFT HAND  Final   Special Requests   Final    BOTTLES DRAWN AEROBIC AND ANAEROBIC Blood Culture adequate volume   Culture   Final    NO GROWTH 5 DAYS Performed at [facility], [address], [place], [place] [address]    Report Status 11/05/2021 FINAL  Final  Culture, blood (Routine X 2) w Reflex to ID Panel     Status: Abnormal   Collection Time: 10/31/21  9:30 AM   Specimen: BLOOD LEFT WRIST  Result Value Ref Range Status   Specimen Description   Final    BLOOD LEFT WRIST Performed at [facility], [address], [place], [place] [address]    Special Requests   Final    BOTTLES DRAWN AEROBIC AND ANAEROBIC Blood Culture adequate volume Performed at [facility], [address] [place], [place] [address]    Culture  Setup Time   Final    AEROBIC BOTTLE ONLY GRAM NEGATIVE RODS CRITICAL RESULT CALLED TO, READ BACK BY AND VERIFIED WITH: ABBY ELLINGTON @1311  11/03/21 MJU Performed at [facility], [place] [address], [place], [place] [address]    Culture CITROBACTER KOSERI (A)  Final   Report Status 11/06/2021 FINAL  Final   Organism ID, Bacteria CITROBACTER KOSERI  Final      Susceptibility   Citrobacter koseri - MIC*    CEFAZOLIN <=4 SENSITIVE Sensitive     CEFEPIME <=0.12 SENSITIVE Sensitive     CEFTAZIDIME <=1 SENSITIVE Sensitive     CEFTRIAXONE <=0.25 SENSITIVE Sensitive     CIPROFLOXACIN <=0.25 SENSITIVE Sensitive     GENTAMICIN <=1 SENSITIVE Sensitive     IMIPENEM <=0.25 SENSITIVE Sensitive     TRIMETH/SULFA <=20 SENSITIVE Sensitive     PIP/TAZO <=4 SENSITIVE Sensitive     * CITROBACTER KOSERI  Blood Culture ID Panel (Reflexed)     Status: Abnormal    Collection Time: 10/31/21  9:30 AM  Result Value Ref Range Status   Enterococcus faecalis NOT DETECTED NOT DETECTED Final   Enterococcus Faecium NOT DETECTED NOT DETECTED Final   Listeria monocytogenes NOT DETECTED NOT DETECTED Final   Staphylococcus species NOT DETECTED NOT DETECTED Final   Staphylococcus aureus (BCID) NOT DETECTED NOT DETECTED Final   Staphylococcus epidermidis NOT DETECTED NOT DETECTED Final   Staphylococcus lugdunensis NOT DETECTED NOT DETECTED Final   Streptococcus species NOT DETECTED NOT DETECTED Final   Streptococcus agalactiae NOT DETECTED NOT DETECTED Final   Streptococcus pneumoniae NOT DETECTED NOT DETECTED Final   Streptococcus pyogenes NOT DETECTED NOT DETECTED Final   A.calcoaceticus-baumannii NOT  DETECTED NOT DETECTED Final   Bacteroides fragilis NOT DETECTED NOT DETECTED Final   Enterobacterales DETECTED (A) NOT DETECTED Final    Comment: Enterobacterales represent a large order of gram negative bacteria, not a single organism. Refer to culture for further identification. CRITICAL RESULT CALLED TO, READ BACK BY AND VERIFIED WITH: ABBY ELLINGTON @1311  11/03/21 MJU    Enterobacter cloacae complex NOT DETECTED NOT DETECTED Final   Escherichia coli NOT DETECTED NOT DETECTED Final   Klebsiella aerogenes NOT DETECTED NOT DETECTED Final   Klebsiella oxytoca NOT DETECTED NOT DETECTED Final   Klebsiella pneumoniae NOT DETECTED NOT DETECTED Final   Proteus species NOT DETECTED NOT DETECTED Final   Salmonella species NOT DETECTED NOT DETECTED Final   Serratia marcescens NOT DETECTED NOT DETECTED Final   Haemophilus influenzae NOT DETECTED NOT DETECTED Final   Neisseria meningitidis NOT DETECTED NOT DETECTED Final   Pseudomonas aeruginosa NOT DETECTED NOT DETECTED Final   Stenotrophomonas maltophilia NOT DETECTED NOT DETECTED Final   Candida albicans NOT DETECTED NOT DETECTED Final   Candida auris NOT DETECTED NOT DETECTED Final   Candida glabrata NOT DETECTED  NOT DETECTED Final   Candida krusei NOT DETECTED NOT DETECTED Final   Candida parapsilosis NOT DETECTED NOT DETECTED Final   Candida tropicalis NOT DETECTED NOT DETECTED Final   Cryptococcus neoformans/gattii NOT DETECTED NOT DETECTED Final   CTX-M ESBL NOT DETECTED NOT DETECTED Final   Carbapenem resistance IMP NOT DETECTED NOT DETECTED Final   Carbapenem resistance KPC NOT DETECTED NOT DETECTED Final   Carbapenem resistance NDM NOT DETECTED NOT DETECTED Final   Carbapenem resist OXA 48 LIKE NOT DETECTED NOT DETECTED Final   Carbapenem resistance VIM NOT DETECTED NOT DETECTED Final    Comment: Performed at [facility], [address] [place], [place] [address]  MRSA Next Gen by PCR, Nasal     Status: Abnormal   Collection Time: 10/31/21 12:15 PM   Specimen: Nasal Mucosa; Nasal Swab  Result Value Ref Range Status   MRSA by PCR Next Gen DETECTED (A) NOT DETECTED Final    Comment: RESULT CALLED TO, READ BACK BY AND VERIFIED WITH: BROOKE DARNEL 10/31/21 1357 MU (NOTE) The GeneXpert MRSA Assay (FDA approved for NASAL specimens only), is one component of a comprehensive MRSA colonization surveillance program. It is not intended to diagnose MRSA infection nor to guide or monitor treatment for MRSA infections. Test performance is not FDA approved in patients less than 56 years old. Performed at [facility], [address] [place], [place] [address]   Gastrointestinal Panel by PCR , Stool     Status: None   Collection Time: 10/31/21 10:05 PM   Specimen: Rectum; Stool  Result Value Ref Range Status   Campylobacter species NOT DETECTED NOT DETECTED Final   Plesimonas shigelloides NOT DETECTED NOT DETECTED Final   Salmonella species NOT DETECTED NOT DETECTED Final   Yersinia enterocolitica NOT DETECTED NOT DETECTED Final   Vibrio species NOT DETECTED NOT DETECTED Final   Vibrio cholerae NOT DETECTED NOT DETECTED Final   Enteroaggregative E coli (EAEC) NOT DETECTED  NOT DETECTED Final   Enteropathogenic E coli (EPEC) NOT DETECTED NOT DETECTED Final   Enterotoxigenic E coli (ETEC) NOT DETECTED NOT DETECTED Final   Shiga like toxin producing E coli (STEC) NOT DETECTED NOT DETECTED Final   Shigella/Enteroinvasive E coli (EIEC) NOT DETECTED NOT DETECTED Final   Cryptosporidium NOT DETECTED NOT DETECTED Final   Cyclospora cayetanensis NOT DETECTED NOT DETECTED Final  Entamoeba histolytica NOT DETECTED NOT DETECTED Final   Giardia lamblia NOT DETECTED NOT DETECTED Final   Adenovirus F40/41 NOT DETECTED NOT DETECTED Final   Astrovirus NOT DETECTED NOT DETECTED Final   Norovirus GI/GII NOT DETECTED NOT DETECTED Final   Rotavirus A NOT DETECTED NOT DETECTED Final   Sapovirus (I, II, IV, and V) NOT DETECTED NOT DETECTED Final    Comment: Performed at [facility], [address] [place], [place] [address]  Aerobic Culture w Gram Stain (superficial specimen)     Status: None (Preliminary result)   Collection Time: 11/03/21  1:49 PM   Specimen: Back; Wound  Result Value Ref Range Status   Specimen Description   Final    BACK Performed at [facility], [address], [place], [place] [address]    Special Requests   Final    NONE Performed at [facility], [address] [place], [place] [address]    Gram Stain   Final    FEW WBC PRESENT, PREDOMINANTLY MONONUCLEAR MODERATE GRAM POSITIVE COCCI FEW GRAM NEGATIVE RODS FEW GRAM POSITIVE RODS RARE YEAST    Culture   Final    CULTURE REINCUBATED FOR BETTER GROWTH Performed at [facility], [place] [address] [place], [place] [address]    Report Status PENDING  Incomplete     Labs: BNP (last 3 results) Recent Labs    10/23/21 1000  BNP 29.5   Basic Metabolic Panel: Recent Labs  Lab 10/31/21 0600 11/01/21 0239 11/02/21 0355 11/03/21 0432 11/04/21 0828 11/05/21 0811  NA 137 139 138 136 139 138  K 3.8 3.8 3.6 3.5 3.3* 3.2*  CL 110 110 108 107 109 108  CO2  23 23 22 22 23 22   GLUCOSE 99 143* 136* 111* 137* 102*  BUN 43* 45* 54* 64* 61* 66*  CREATININE 1.81* 1.76* 1.81* 1.71* 1.68* 1.67*  CALCIUM 7.6* 8.0* 8.0* 7.9* 7.8* 8.1*  MG 1.7 2.0 1.7 2.0 1.8  --   PHOS 3.2  --   --   --   --   --    Liver Function Tests: No results for input(s): AST, ALT, ALKPHOS, BILITOT, PROT, ALBUMIN in the last 168 hours. No results for input(s): LIPASE, AMYLASE in the last 168 hours. No results for input(s): AMMONIA in the last 168 hours. CBC: Recent Labs  Lab 11/01/21 0239 11/02/21 0355 11/03/21 0432 11/04/21 0828 11/05/21 0811  WBC 10.9* 15.3* 12.7* 20.7* 15.6*  NEUTROABS  --   --   --   --  13.1*  HGB 8.2* 8.4* 8.0* 7.8* 8.1*  HCT 24.6* 24.5* 23.4* 22.4* 23.7*  MCV 83.4 83.6 81.8 82.4 83.5  PLT 149* 162 174 175 210   Cardiac Enzymes: No results for input(s): CKTOTAL, CKMB, CKMBINDEX, TROPONINI in the last 168 hours. BNP: Invalid input(s): POCBNP CBG: Recent Labs  Lab 10/31/21 1212  GLUCAP 92   D-Dimer No results for input(s): DDIMER in the last 72 hours. Hgb A1c No results for input(s): HGBA1C in the last 72 hours. Lipid Profile No results for input(s): CHOL, HDL, LDLCALC, TRIG, CHOLHDL, LDLDIRECT in the last 72 hours. Thyroid function studies No results for input(s): TSH, T4TOTAL, T3FREE, THYROIDAB in the last 72 hours.  Invalid input(s): FREET3 Anemia work up No results for input(s): VITAMINB12, FOLATE, FERRITIN, TIBC, IRON, RETICCTPCT in the last 72 hours. Urinalysis    Component Value Date/Time   COLORURINE YELLOW (A) 10/23/2021 1546   APPEARANCEUR CLOUDY (A) 10/23/2021 1546  LABSPEC 1.006 10/23/2021 1546   PHURINE 5.0 10/23/2021 1546   GLUCOSEU NEGATIVE 10/23/2021 1546   HGBUR LARGE (A) 10/23/2021 1546   BILIRUBINUR NEGATIVE 10/23/2021 1546   KETONESUR NEGATIVE 10/23/2021 1546   PROTEINUR NEGATIVE 10/23/2021 1546   NITRITE NEGATIVE 10/23/2021 1546   LEUKOCYTESUR LARGE (A) 10/23/2021 1546   Sepsis Labs Invalid  input(s): PROCALCITONIN,  WBC,  LACTICIDVEN Microbiology Recent Results (from the past 240 hour(s))  Culture, blood (Routine X 2) w Reflex to ID Panel     Status: None   Collection Time: 10/31/21  9:30 AM   Specimen: BLOOD LEFT HAND  Result Value Ref Range Status   Specimen Description BLOOD LEFT HAND  Final   Special Requests   Final    BOTTLES DRAWN AEROBIC AND ANAEROBIC Blood Culture adequate volume   Culture   Final    NO GROWTH 5 DAYS Performed at [facility], [address], [place], [place] [address]    Report Status 11/05/2021 FINAL  Final  Culture, blood (Routine X 2) w Reflex to ID Panel     Status: Abnormal   Collection Time: 10/31/21  9:30 AM   Specimen: BLOOD LEFT WRIST  Result Value Ref Range Status   Specimen Description   Final    BLOOD LEFT WRIST Performed at [facility], [address] [place], [place] [address]    Special Requests   Final    BOTTLES DRAWN AEROBIC AND ANAEROBIC Blood Culture adequate volume Performed at [facility], [address] [place], [place] [address]    Culture  Setup Time   Final    AEROBIC BOTTLE ONLY GRAM NEGATIVE RODS CRITICAL RESULT CALLED TO, READ BACK BY AND VERIFIED WITH: ABBY ELLINGTON @1311  11/03/21 MJU Performed at [facility], [place] [address], [place], [place] [address]    Culture CITROBACTER KOSERI (A)  Final   Report Status 11/06/2021 FINAL  Final   Organism ID, Bacteria CITROBACTER KOSERI  Final      Susceptibility   Citrobacter koseri - MIC*    CEFAZOLIN <=4 SENSITIVE Sensitive     CEFEPIME <=0.12 SENSITIVE Sensitive     CEFTAZIDIME <=1 SENSITIVE Sensitive     CEFTRIAXONE <=0.25 SENSITIVE Sensitive     CIPROFLOXACIN <=0.25 SENSITIVE Sensitive     GENTAMICIN <=1 SENSITIVE Sensitive     IMIPENEM <=0.25 SENSITIVE Sensitive     TRIMETH/SULFA <=20 SENSITIVE Sensitive     PIP/TAZO <=4 SENSITIVE Sensitive     * CITROBACTER KOSERI  Blood Culture ID Panel (Reflexed)     Status:  Abnormal   Collection Time: 10/31/21  9:30 AM  Result Value Ref Range Status   Enterococcus faecalis NOT DETECTED NOT DETECTED Final   Enterococcus Faecium NOT DETECTED NOT DETECTED Final   Listeria monocytogenes NOT DETECTED NOT DETECTED Final   Staphylococcus species NOT DETECTED NOT DETECTED Final   Staphylococcus aureus (BCID) NOT DETECTED NOT DETECTED Final   Staphylococcus epidermidis NOT DETECTED NOT DETECTED Final   Staphylococcus lugdunensis NOT DETECTED NOT DETECTED Final   Streptococcus species NOT DETECTED NOT DETECTED Final   Streptococcus agalactiae NOT DETECTED NOT DETECTED Final   Streptococcus pneumoniae NOT DETECTED NOT DETECTED Final   Streptococcus pyogenes NOT DETECTED NOT DETECTED Final   A.calcoaceticus-baumannii NOT DETECTED NOT DETECTED Final   Bacteroides fragilis NOT DETECTED NOT DETECTED Final   Enterobacterales DETECTED (A) NOT DETECTED Final    Comment: Enterobacterales represent a large order of gram negative bacteria, not a single organism. Refer  to culture for further identification. CRITICAL RESULT CALLED TO, READ BACK BY AND VERIFIED WITH: ABBY ELLINGTON @1311  11/03/21 MJU    Enterobacter cloacae complex NOT DETECTED NOT DETECTED Final   Escherichia coli NOT DETECTED NOT DETECTED Final   Klebsiella aerogenes NOT DETECTED NOT DETECTED Final   Klebsiella oxytoca NOT DETECTED NOT DETECTED Final   Klebsiella pneumoniae NOT DETECTED NOT DETECTED Final   Proteus species NOT DETECTED NOT DETECTED Final   Salmonella species NOT DETECTED NOT DETECTED Final   Serratia marcescens NOT DETECTED NOT DETECTED Final   Haemophilus influenzae NOT DETECTED NOT DETECTED Final   Neisseria meningitidis NOT DETECTED NOT DETECTED Final   Pseudomonas aeruginosa NOT DETECTED NOT DETECTED Final   Stenotrophomonas maltophilia NOT DETECTED NOT DETECTED Final   Candida albicans NOT DETECTED NOT DETECTED Final   Candida auris NOT DETECTED NOT DETECTED Final   Candida glabrata  NOT DETECTED NOT DETECTED Final   Candida krusei NOT DETECTED NOT DETECTED Final   Candida parapsilosis NOT DETECTED NOT DETECTED Final   Candida tropicalis NOT DETECTED NOT DETECTED Final   Cryptococcus neoformans/gattii NOT DETECTED NOT DETECTED Final   CTX-M ESBL NOT DETECTED NOT DETECTED Final   Carbapenem resistance IMP NOT DETECTED NOT DETECTED Final   Carbapenem resistance KPC NOT DETECTED NOT DETECTED Final   Carbapenem resistance NDM NOT DETECTED NOT DETECTED Final   Carbapenem resist OXA 48 LIKE NOT DETECTED NOT DETECTED Final   Carbapenem resistance VIM NOT DETECTED NOT DETECTED Final    Comment: Performed at [facility], [address] [place], [place] [address]  MRSA Next Gen by PCR, Nasal     Status: Abnormal   Collection Time: 10/31/21 12:15 PM   Specimen: Nasal Mucosa; Nasal Swab  Result Value Ref Range Status   MRSA by PCR Next Gen DETECTED (A) NOT DETECTED Final    Comment: RESULT CALLED TO, READ BACK BY AND VERIFIED WITH: BROOKE DARNEL 10/31/21 1357 MU (NOTE) The GeneXpert MRSA Assay (FDA approved for NASAL specimens only), is one component of a comprehensive MRSA colonization surveillance program. It is not intended to diagnose MRSA infection nor to guide or monitor treatment for MRSA infections. Test performance is not FDA approved in patients less than 20 years old. Performed at [facility], [address] [place], [place] [address]   Gastrointestinal Panel by PCR , Stool     Status: None   Collection Time: 10/31/21 10:05 PM   Specimen: Rectum; Stool  Result Value Ref Range Status   Campylobacter species NOT DETECTED NOT DETECTED Final   Plesimonas shigelloides NOT DETECTED NOT DETECTED Final   Salmonella species NOT DETECTED NOT DETECTED Final   Yersinia enterocolitica NOT DETECTED NOT DETECTED Final   Vibrio species NOT DETECTED NOT DETECTED Final   Vibrio cholerae NOT DETECTED NOT DETECTED Final   Enteroaggregative E coli (EAEC)  NOT DETECTED NOT DETECTED Final   Enteropathogenic E coli (EPEC) NOT DETECTED NOT DETECTED Final   Enterotoxigenic E coli (ETEC) NOT DETECTED NOT DETECTED Final   Shiga like toxin producing E coli (STEC) NOT DETECTED NOT DETECTED Final   Shigella/Enteroinvasive E coli (EIEC) NOT DETECTED NOT DETECTED Final   Cryptosporidium NOT DETECTED NOT DETECTED Final   Cyclospora cayetanensis NOT DETECTED NOT DETECTED Final   Entamoeba histolytica NOT DETECTED NOT DETECTED Final   Giardia lamblia NOT DETECTED NOT DETECTED Final   Adenovirus F40/41 NOT DETECTED NOT DETECTED Final   Astrovirus NOT DETECTED NOT DETECTED Final   Norovirus GI/GII NOT DETECTED  NOT DETECTED Final   Rotavirus A NOT DETECTED NOT DETECTED Final   Sapovirus (I, II, IV, and V) NOT DETECTED NOT DETECTED Final    Comment: Performed at [facility], [place], [place] [address]  Aerobic Culture w Gram Stain (superficial specimen)     Status: None (Preliminary result)   Collection Time: 11/03/21  1:49 PM   Specimen: Back; Wound  Result Value Ref Range Status   Specimen Description   Final    BACK Performed at [facility], [address] [place], [place] [address]    Special Requests   Final    NONE Performed at [facility], [address] [place], [place] [address]    Gram Stain   Final    FEW WBC PRESENT, PREDOMINANTLY MONONUCLEAR MODERATE GRAM POSITIVE COCCI FEW GRAM NEGATIVE RODS FEW GRAM POSITIVE RODS RARE YEAST    Culture   Final    CULTURE REINCUBATED FOR BETTER GROWTH Performed at [facility], [place] [address] [place], [place] [address]    Report Status PENDING  Incomplete     Time coordinating discharge: Over 30 minutes  SIGNED:   Sidney Ace, MD  [facility] 11/06/2021, 10:08 AM Pager   If 7PM-7AM, please contact night-coverage

## 2021-11-06 NOTE — Care Management Important Message (Signed)
Important Message  Patient Details  Name: Kayla Long MRN: [id number] Date of Birth: [birth year]   Medicare Important Message Given:  Yes     [name] 11/06/2021, 10:38 AM

## 2021-11-06 NOTE — Progress Notes (Signed)
PHARMACY NOTE:  ANTIMICROBIAL RENAL DOSAGE ADJUSTMENT  Current antimicrobial regimen includes a mismatch between antimicrobial dosage and estimated renal function.  As per policy approved by the Pharmacy & Therapeutics and Medical Executive Committees, the antimicrobial dosage will be adjusted accordingly.  Current antimicrobial dosage:  Levofloxacin 750 mg PO every 48 hours  Indication: Gram-negative bacteremia  Renal Function:  Estimated Creatinine Clearance: 53.6 mL/min (A) (by C-G formula based on SCr of 1.67 mg/dL (H)). []      On intermittent HD, scheduled: []      On CRRT    Antimicrobial dosage has been changed to:  Levofloxacin 750mg  PO every 24 hours  Additional comments:   Thank you for allowing pharmacy to be a part of this patient's care.  Pernell Dupre, [facility] 11/06/2021 9:45 AM

## 2021-11-06 NOTE — Progress Notes (Signed)
°   11/06/21 0804  Assess: MEWS Score  Temp 98.3 F (36.8 C)  BP (!) 109/53  Pulse Rate (!) 114  Resp 16  Level of Consciousness Alert  SpO2 100 %  Assess: MEWS Score  MEWS Temp 0  MEWS Systolic 0  MEWS Pulse 2  MEWS RR 0  MEWS LOC 0  MEWS Score 2  MEWS Score Color Yellow  Assess: if the MEWS score is Yellow or Red  Were vital signs taken at a resting state? Yes  Focused Assessment No change from prior assessment  Does the patient meet 2 or more of the SIRS criteria? Yes  Does the patient have a confirmed or suspected source of infection? Yes  Provider and Rapid Response Notified? No  MEWS guidelines implemented *See Row Information* No, vital signs rechecked  Take Vital Signs  Increase Vital Sign Frequency   (no reassessed Green MEWS)  Escalate  MEWS: Escalate Yellow: discuss with charge nurse/RN and consider discussing with provider and RRT  Notify: Charge Nurse/RN  Name of Charge Nurse/RN Notified Denice Paradise  Date Charge Nurse/RN Notified 11/06/21  Time Charge Nurse/RN Notified 0820  Document  Patient Outcome Stabilized after interventions  Progress note created (see row info) Yes  Assess: SIRS CRITERIA  SIRS Temperature  0  SIRS Pulse 1  SIRS Respirations  0  SIRS WBC 0  SIRS Score Sum  1

## 2021-11-07 LAB — AEROBIC CULTURE W GRAM STAIN (SUPERFICIAL SPECIMEN)

## 2021-11-30 ENCOUNTER — Encounter (HOSPITAL_COMMUNITY): Payer: Self-pay | Admitting: Radiology

## 2022-01-13 ENCOUNTER — Inpatient Hospital Stay
Admission: EM | Admit: 2022-01-13 | Discharge: 2022-01-15 | DRG: 689 | Disposition: A | Discharge status: Home / Self Care | Payer: Medicare HMO | Attending: Internal Medicine | Admitting: Internal Medicine

## 2022-01-13 ENCOUNTER — Emergency Department: Payer: Medicare HMO

## 2022-01-13 DIAGNOSIS — L89312 Pressure ulcer of right buttock, stage 2: Secondary | ICD-10-CM | POA: Diagnosis present

## 2022-01-13 DIAGNOSIS — Z888 Allergy status to other drugs, medicaments and biological substances status: Secondary | ICD-10-CM

## 2022-01-13 DIAGNOSIS — I890 Lymphedema, not elsewhere classified: Secondary | ICD-10-CM | POA: Diagnosis present

## 2022-01-13 DIAGNOSIS — K219 Gastro-esophageal reflux disease without esophagitis: Secondary | ICD-10-CM | POA: Diagnosis present

## 2022-01-13 DIAGNOSIS — M4800 Spinal stenosis, site unspecified: Secondary | ICD-10-CM | POA: Diagnosis present

## 2022-01-13 DIAGNOSIS — N3000 Acute cystitis without hematuria: Principal | ICD-10-CM | POA: Diagnosis present

## 2022-01-13 DIAGNOSIS — D638 Anemia in other chronic diseases classified elsewhere: Secondary | ICD-10-CM | POA: Diagnosis not present

## 2022-01-13 DIAGNOSIS — Z20822 Contact with and (suspected) exposure to covid-19: Secondary | ICD-10-CM | POA: Diagnosis present

## 2022-01-13 DIAGNOSIS — E785 Hyperlipidemia, unspecified: Secondary | ICD-10-CM | POA: Diagnosis present

## 2022-01-13 DIAGNOSIS — Z7401 Bed confinement status: Secondary | ICD-10-CM

## 2022-01-13 DIAGNOSIS — Z86010 Personal history of colonic polyps: Secondary | ICD-10-CM | POA: Diagnosis not present

## 2022-01-13 DIAGNOSIS — G629 Polyneuropathy, unspecified: Secondary | ICD-10-CM | POA: Diagnosis present

## 2022-01-13 DIAGNOSIS — F32A Depression, unspecified: Secondary | ICD-10-CM | POA: Diagnosis present

## 2022-01-13 DIAGNOSIS — D631 Anemia in chronic kidney disease: Secondary | ICD-10-CM | POA: Diagnosis present

## 2022-01-13 DIAGNOSIS — Z8249 Family history of ischemic heart disease and other diseases of the circulatory system: Secondary | ICD-10-CM

## 2022-01-13 DIAGNOSIS — E860 Dehydration: Secondary | ICD-10-CM | POA: Diagnosis present

## 2022-01-13 DIAGNOSIS — R946 Abnormal results of thyroid function studies: Secondary | ICD-10-CM | POA: Diagnosis present

## 2022-01-13 DIAGNOSIS — N1831 Chronic kidney disease, stage 3a: Secondary | ICD-10-CM | POA: Diagnosis present

## 2022-01-13 DIAGNOSIS — I872 Venous insufficiency (chronic) (peripheral): Secondary | ICD-10-CM | POA: Diagnosis present

## 2022-01-13 DIAGNOSIS — Z79899 Other long term (current) drug therapy: Secondary | ICD-10-CM

## 2022-01-13 DIAGNOSIS — G9341 Metabolic encephalopathy: Secondary | ICD-10-CM | POA: Diagnosis present

## 2022-01-13 DIAGNOSIS — N183 Chronic kidney disease, stage 3 unspecified: Secondary | ICD-10-CM | POA: Diagnosis present

## 2022-01-13 DIAGNOSIS — K589 Irritable bowel syndrome without diarrhea: Secondary | ICD-10-CM | POA: Diagnosis present

## 2022-01-13 DIAGNOSIS — Z87891 Personal history of nicotine dependence: Secondary | ICD-10-CM

## 2022-01-13 DIAGNOSIS — L8990 Pressure ulcer of unspecified site, unspecified stage: Secondary | ICD-10-CM | POA: Insufficient documentation

## 2022-01-13 DIAGNOSIS — Z90711 Acquired absence of uterus with remaining cervical stump: Secondary | ICD-10-CM | POA: Diagnosis not present

## 2022-01-13 DIAGNOSIS — E8809 Other disorders of plasma-protein metabolism, not elsewhere classified: Secondary | ICD-10-CM | POA: Diagnosis present

## 2022-01-13 DIAGNOSIS — R7989 Other specified abnormal findings of blood chemistry: Secondary | ICD-10-CM | POA: Diagnosis present

## 2022-01-13 DIAGNOSIS — R4182 Altered mental status, unspecified: Secondary | ICD-10-CM | POA: Diagnosis present

## 2022-01-13 DIAGNOSIS — I5032 Chronic diastolic (congestive) heart failure: Secondary | ICD-10-CM | POA: Diagnosis present

## 2022-01-13 DIAGNOSIS — Z6841 Body Mass Index (BMI) 40.0 and over, adult: Secondary | ICD-10-CM

## 2022-01-13 DIAGNOSIS — N19 Unspecified kidney failure: Secondary | ICD-10-CM | POA: Diagnosis present

## 2022-01-13 DIAGNOSIS — I959 Hypotension, unspecified: Secondary | ICD-10-CM | POA: Diagnosis present

## 2022-01-13 DIAGNOSIS — E876 Hypokalemia: Secondary | ICD-10-CM | POA: Diagnosis present

## 2022-01-13 HISTORY — DX: Pressure ulcer of unspecified site, unspecified stage: L89.90

## 2022-01-13 HISTORY — DX: Lymphedema, not elsewhere classified: I89.0

## 2022-01-13 LAB — URINALYSIS, ROUTINE W REFLEX MICROSCOPIC
Bilirubin Urine: NEGATIVE
Glucose, UA: NEGATIVE mg/dL
Ketones, ur: NEGATIVE mg/dL
Nitrite: NEGATIVE
Protein, ur: NEGATIVE mg/dL
RBC / HPF: >50 RBC/hpf — ABNORMAL HIGH (ref 0–5)
Specific Gravity, Urine: 1.008 (ref 1.005–1.030)
Squamous Epithelial / HPF: NONE SEEN (ref 0–5)
WBC, UA: >50 WBC/hpf — ABNORMAL HIGH (ref 0–5)
pH: 5 (ref 5.0–8.0)

## 2022-01-13 LAB — CBC WITH DIFFERENTIAL/PLATELET
Abs Immature Granulocytes: 0.04 10*3/uL (ref 0.00–0.07)
Basophils Absolute: 0.1 10*3/uL (ref 0.0–0.1)
Basophils Relative: 1 %
Eosinophils Absolute: 0.2 10*3/uL (ref 0.0–0.5)
Eosinophils Relative: 2 %
HCT: 26.2 % — ABNORMAL LOW (ref 36.0–46.0)
Hemoglobin: 8.2 g/dL — ABNORMAL LOW (ref 12.0–15.0)
Immature Granulocytes: 0 %
Lymphocytes Relative: 27 %
Lymphs Abs: 2.8 10*3/uL (ref 0.7–4.0)
MCH: 29.1 pg (ref 26.0–34.0)
MCHC: 31.3 g/dL (ref 30.0–36.0)
MCV: 92.9 fL (ref 80.0–100.0)
Monocytes Absolute: 0.6 10*3/uL (ref 0.1–1.0)
Monocytes Relative: 6 %
Neutro Abs: 6.6 10*3/uL (ref 1.7–7.7)
Neutrophils Relative %: 64 %
Platelets: 247 10*3/uL (ref 150–400)
RBC: 2.82 MIL/uL — ABNORMAL LOW (ref 3.87–5.11)
RDW: 16 % — ABNORMAL HIGH (ref 11.5–15.5)
WBC: 10.3 10*3/uL (ref 4.0–10.5)
nRBC: 0 % (ref 0.0–0.2)

## 2022-01-13 LAB — RESP PANEL BY RT-PCR (FLU A&B, COVID) ARPGX2
Influenza A by PCR: NEGATIVE
Influenza B by PCR: NEGATIVE
SARS Coronavirus 2 by RT PCR: NEGATIVE

## 2022-01-13 LAB — COMPREHENSIVE METABOLIC PANEL
ALT: 8 U/L (ref 0–44)
AST: 12 U/L — ABNORMAL LOW (ref 15–41)
Albumin: 1.7 g/dL — ABNORMAL LOW (ref 3.5–5.0)
Alkaline Phosphatase: 104 U/L (ref 38–126)
Anion gap: 7 (ref 5–15)
BUN: 25 mg/dL — ABNORMAL HIGH (ref 8–23)
CO2: 26 mmol/L (ref 22–32)
Calcium: 7.6 mg/dL — ABNORMAL LOW (ref 8.9–10.3)
Chloride: 103 mmol/L (ref 98–111)
Creatinine, Ser: 1.14 mg/dL — ABNORMAL HIGH (ref 0.44–1.00)
GFR, Estimated: 51 mL/min — ABNORMAL LOW (ref >60)
Glucose, Bld: 97 mg/dL (ref 70–99)
Potassium: 2.2 mmol/L — CL (ref 3.5–5.1)
Sodium: 136 mmol/L (ref 135–145)
Total Bilirubin: 0.4 mg/dL (ref 0.3–1.2)
Total Protein: 5.4 g/dL — ABNORMAL LOW (ref 6.5–8.1)

## 2022-01-13 LAB — ETHANOL: Alcohol, Ethyl (B): <10 mg/dL (ref <10)

## 2022-01-13 LAB — TROPONIN I (HIGH SENSITIVITY): Troponin I (High Sensitivity): 9 ng/L (ref <18)

## 2022-01-13 LAB — MAGNESIUM: Magnesium: 1.8 mg/dL (ref 1.7–2.4)

## 2022-01-13 LAB — BRAIN NATRIURETIC PEPTIDE: B Natriuretic Peptide: 95.2 pg/mL (ref 0.0–100.0)

## 2022-01-13 MED ORDER — ACETAMINOPHEN 325 MG PO TABS: 650.0000 mg | ORAL_TABLET | Freq: Four times a day (QID) | ORAL | Status: DC | PRN

## 2022-01-13 MED ORDER — POTASSIUM CHLORIDE 10 MEQ/100ML IV SOLN
10.0000 meq | INTRAVENOUS | Status: DC
Start: 2022-01-13 — End: 2022-01-13

## 2022-01-13 MED ORDER — ACETAMINOPHEN 650 MG RE SUPP: 650.0000 mg | Freq: Four times a day (QID) | RECTAL | Status: DC | PRN

## 2022-01-13 MED ORDER — POTASSIUM CHLORIDE CRYS ER 20 MEQ PO TBCR
40.0000 meq | EXTENDED_RELEASE_TABLET | Freq: Once | ORAL | Status: AC
  Administered 2022-01-13: 40 meq via ORAL
  Filled 2022-01-13: qty 2

## 2022-01-13 MED ORDER — POTASSIUM CHLORIDE 10 MEQ/100ML IV SOLN
10.0000 meq | INTRAVENOUS | Status: AC
  Administered 2022-01-13 – 2022-01-14 (×4): 10 meq via INTRAVENOUS
  Filled 2022-01-13 (×4): qty 100

## 2022-01-13 MED ORDER — SODIUM CHLORIDE 0.9 % IV SOLN
1.0000 g | INTRAVENOUS | Status: DC
  Administered 2022-01-14: 1 g via INTRAVENOUS
  Filled 2022-01-13 (×3): qty 10

## 2022-01-13 MED ORDER — MIDODRINE HCL 5 MG PO TABS
10.0000 mg | ORAL_TABLET | Freq: Three times a day (TID) | ORAL | Status: DC
  Administered 2022-01-14 – 2022-01-15 (×6): 10 mg via ORAL
  Filled 2022-01-13 (×6): qty 2

## 2022-01-13 MED ORDER — SODIUM CHLORIDE 0.9 % IV SOLN
1.0000 g | Freq: Once | INTRAVENOUS | Status: AC
  Administered 2022-01-13: 1 g via INTRAVENOUS
  Filled 2022-01-13: qty 10

## 2022-01-13 MED ORDER — LACTATED RINGERS IV BOLUS
500.0000 mL | Freq: Once | INTRAVENOUS | Status: AC
  Administered 2022-01-14: 500 mL via INTRAVENOUS

## 2022-01-13 MED ORDER — SODIUM CHLORIDE 0.9 % IV BOLUS
1000.0000 mL | Freq: Once | INTRAVENOUS | Status: AC
  Administered 2022-01-13: 1000 mL via INTRAVENOUS

## 2022-01-13 NOTE — ED Provider Notes (Signed)
See  [facility] Provider Note   Event Date/Time   First MD Initiated Contact with Patient 01/13/22 2022     (approximate) History  Altered Mental Status and Wound Infection  HPI Kayla Long is a 76 y.o. female with a history of hypotension on midodrine, difficult bilateral lower extremity lymphedema, bedbound status, and diastolic CHF who presents via EMS for hypotension and altered mental status.  Patient is only oriented to self and history obtained from prehospital team.  They note patient does have a significant decubitus ulcer that has had more purulent drainage than usual.  Also noticed the patient to be hypotensive with last blood pressure being 101/50.  Further history and review of systems unable to be obtained at this time secondary to patient mental status Physical Exam  Triage Vital Signs: ED Triage Vitals [01/13/22 2020]  Enc Vitals Group     BP (!) 94/50     Pulse Rate (!) 113     Resp 18     Temp      Temp src      SpO2 100 %     Weight      Height      Head Circumference      Peak Flow      Pain Score      Pain Loc      Pain Edu?      Excl. in [place]?    Most recent vital signs: Vitals:   01/13/22 2020  BP: (!) 94/50  Pulse: (!) 113  Resp: 18  SpO2: 100%   General: Awake, oriented only to person CV:  Poor peripheral perfusion in bilateral lower extremities Resp:  Normal effort.  Abd:  No distention.  Other:  Morbidly obese African-American female with significant lymphedema bilaterally and decubitus ulcer with purulent drainage and surrounding erythema ED Results / Procedures / Treatments  Labs (all labs ordered are listed, but only abnormal results are displayed) Labs Reviewed  RESP PANEL BY RT-PCR (FLU A&B, COVID) [id number]  CULTURE, BLOOD (ROUTINE X 2)  CULTURE, BLOOD (ROUTINE X 2)  BRAIN NATRIURETIC PEPTIDE  COMPREHENSIVE METABOLIC PANEL  ETHANOL  CBC WITH DIFFERENTIAL/PLATELET  URINALYSIS, ROUTINE W REFLEX MICROSCOPIC   TROPONIN I (HIGH SENSITIVITY)   EKG ED ECG REPORT I, Naaman Plummer, the attending physician, personally viewed and interpreted this ECG. Date: 01/13/2022 EKG Time: 2022 Rate: 112 Rhythm: Tachycardic sinus rhythm QRS Axis: normal Intervals: normal ST/T Wave abnormalities: normal Narrative Interpretation: Tachycardic sinus rhythm.  No evidence of acute ischemia RADIOLOGY ED MD interpretation: Pending at signout -Agree with radiology assessment Official radiology report(s): No results found. PROCEDURES: Critical Care performed: No Procedures MEDICATIONS ORDERED IN ED: Medications  sodium chloride 0.9 % bolus 1,000 mL (has no administration in time range)   IMPRESSION / MDM / ASSESSMENT AND PLAN / ED COURSE  I reviewed the triage vital signs and the nursing notes.                             Differential diagnosis includes, but is not limited to, skin soft tissue infection, sepsis, pulmonary edema, respiratory failure, urinary tract infection The patient is on the cardiac monitor to evaluate for evidence of arrhythmia and/or significant heart rate changes. Patient is a 76 year old morbidly obese female who presents for episodes of altered mental status recently.  On exam patient does have what looks to be an acutely infected decubitus ulcer, morbid obesity  with significant lymphedema, and tachycardia. Patient's laboratory and radiologic evaluation pending at the end of my shift. Care of this patient will be signed out to the oncoming physician at the end of my shift.  All pertinent patient information conveyed and all questions answered.  All further care and disposition decisions will be made by the oncoming physician.   FINAL CLINICAL IMPRESSION(S) / ED DIAGNOSES   Final diagnoses:  None   Rx / DC Orders   ED Discharge Orders     None      Note:  This document was prepared using Dragon voice recognition software and may include unintentional dictation errors.    Naaman Plummer, MD 01/23/22 1101

## 2022-01-13 NOTE — ED Notes (Signed)
Kayla Long, messaged at this time regarding pt's hypotension

## 2022-01-13 NOTE — ED Triage Notes (Signed)
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 Pt is "in a daze" per family/EMS.

## 2022-01-14 ENCOUNTER — Other Ambulatory Visit: Payer: Self-pay

## 2022-01-14 ENCOUNTER — Encounter: Payer: Self-pay | Admitting: Internal Medicine

## 2022-01-14 DIAGNOSIS — K219 Gastro-esophageal reflux disease without esophagitis: Secondary | ICD-10-CM | POA: Diagnosis present

## 2022-01-14 DIAGNOSIS — N3000 Acute cystitis without hematuria: Secondary | ICD-10-CM | POA: Diagnosis present

## 2022-01-14 DIAGNOSIS — N19 Unspecified kidney failure: Secondary | ICD-10-CM | POA: Diagnosis present

## 2022-01-14 DIAGNOSIS — E860 Dehydration: Secondary | ICD-10-CM | POA: Diagnosis present

## 2022-01-14 DIAGNOSIS — D638 Anemia in other chronic diseases classified elsewhere: Secondary | ICD-10-CM | POA: Diagnosis present

## 2022-01-14 DIAGNOSIS — G9341 Metabolic encephalopathy: Secondary | ICD-10-CM | POA: Diagnosis not present

## 2022-01-14 LAB — IRON AND TIBC: Iron: 30 ug/dL (ref 28–170)

## 2022-01-14 LAB — COMPREHENSIVE METABOLIC PANEL
ALT: 8 U/L (ref 0–44)
AST: 12 U/L — ABNORMAL LOW (ref 15–41)
Albumin: 1.6 g/dL — ABNORMAL LOW (ref 3.5–5.0)
Alkaline Phosphatase: 91 U/L (ref 38–126)
Anion gap: 6 (ref 5–15)
BUN: 25 mg/dL — ABNORMAL HIGH (ref 8–23)
CO2: 24 mmol/L (ref 22–32)
Calcium: 7 mg/dL — ABNORMAL LOW (ref 8.9–10.3)
Chloride: 105 mmol/L (ref 98–111)
Creatinine, Ser: 1.04 mg/dL — ABNORMAL HIGH (ref 0.44–1.00)
GFR, Estimated: 57 mL/min — ABNORMAL LOW (ref >60)
Glucose, Bld: 77 mg/dL (ref 70–99)
Potassium: 2.7 mmol/L — CL (ref 3.5–5.1)
Sodium: 135 mmol/L (ref 135–145)
Total Bilirubin: 0.3 mg/dL (ref 0.3–1.2)
Total Protein: 4.8 g/dL — ABNORMAL LOW (ref 6.5–8.1)

## 2022-01-14 LAB — CBC WITH DIFFERENTIAL/PLATELET
Abs Immature Granulocytes: 0.03 10*3/uL (ref 0.00–0.07)
Basophils Absolute: 0.1 10*3/uL (ref 0.0–0.1)
Basophils Relative: 1 %
Eosinophils Absolute: 0.2 10*3/uL (ref 0.0–0.5)
Eosinophils Relative: 2 %
HCT: 24.3 % — ABNORMAL LOW (ref 36.0–46.0)
Hemoglobin: 7.6 g/dL — ABNORMAL LOW (ref 12.0–15.0)
Immature Granulocytes: 0 %
Lymphocytes Relative: 34 %
Lymphs Abs: 3.1 10*3/uL (ref 0.7–4.0)
MCH: 29 pg (ref 26.0–34.0)
MCHC: 31.3 g/dL (ref 30.0–36.0)
MCV: 92.7 fL (ref 80.0–100.0)
Monocytes Absolute: 0.6 10*3/uL (ref 0.1–1.0)
Monocytes Relative: 6 %
Neutro Abs: 5.1 10*3/uL (ref 1.7–7.7)
Neutrophils Relative %: 57 %
Platelets: 230 10*3/uL (ref 150–400)
RBC: 2.62 MIL/uL — ABNORMAL LOW (ref 3.87–5.11)
RDW: 16.2 % — ABNORMAL HIGH (ref 11.5–15.5)
WBC: 9.1 10*3/uL (ref 4.0–10.5)
nRBC: 0 % (ref 0.0–0.2)

## 2022-01-14 LAB — RETICULOCYTES
Immature Retic Fract: 25.6 % — ABNORMAL HIGH (ref 2.3–15.9)
RBC.: 2.62 MIL/uL — ABNORMAL LOW (ref 3.87–5.11)
Retic Count, Absolute: 57.9 10*3/uL (ref 19.0–186.0)
Retic Ct Pct: 2.2 % (ref 0.4–3.1)

## 2022-01-14 LAB — FERRITIN: Ferritin: 398 ng/mL — ABNORMAL HIGH (ref 11–307)

## 2022-01-14 LAB — TROPONIN I (HIGH SENSITIVITY): Troponin I (High Sensitivity): 9 ng/L (ref <18)

## 2022-01-14 LAB — BLOOD GAS, VENOUS
Acid-Base Excess: 0 mmol/L (ref 0.0–2.0)
Bicarbonate: 24.8 mmol/L (ref 20.0–28.0)
O2 Saturation: 93.5 %
Patient temperature: 37
pCO2, Ven: 40 mmHg — ABNORMAL LOW (ref 44–60)
pH, Ven: 7.4 (ref 7.25–7.43)
pO2, Ven: 63 mmHg — ABNORMAL HIGH (ref 32–45)

## 2022-01-14 LAB — TSH: TSH: 6.941 u[IU]/mL — ABNORMAL HIGH (ref 0.350–4.500)

## 2022-01-14 LAB — PROTIME-INR
INR: 1 (ref 0.8–1.2)
Prothrombin Time: 13.2 seconds (ref 11.4–15.2)

## 2022-01-14 LAB — FOLATE: Folate: 4.2 ng/mL — ABNORMAL LOW (ref >5.9)

## 2022-01-14 LAB — MAGNESIUM: Magnesium: 1.6 mg/dL — ABNORMAL LOW (ref 1.7–2.4)

## 2022-01-14 LAB — CK: Total CK: 21 U/L — ABNORMAL LOW (ref 38–234)

## 2022-01-14 LAB — PROCALCITONIN: Procalcitonin: 0.19 ng/mL

## 2022-01-14 MED ORDER — SENNOSIDES-DOCUSATE SODIUM 8.6-50 MG PO TABS: 1.0000 | ORAL_TABLET | Freq: Every evening | ORAL | Status: DC | PRN

## 2022-01-14 MED ORDER — PAROXETINE HCL 30 MG PO TABS
30.0000 mg | ORAL_TABLET | Freq: Every day | ORAL | Status: DC
  Administered 2022-01-14 – 2022-01-15 (×2): 30 mg via ORAL
  Filled 2022-01-14 (×2): qty 1

## 2022-01-14 MED ORDER — POTASSIUM CHLORIDE CRYS ER 20 MEQ PO TBCR
40.0000 meq | EXTENDED_RELEASE_TABLET | ORAL | Status: AC
  Administered 2022-01-14 (×2): 40 meq via ORAL
  Filled 2022-01-14 (×2): qty 2

## 2022-01-14 MED ORDER — MAGNESIUM SULFATE 2 GM/50ML IV SOLN
2.0000 g | Freq: Once | INTRAVENOUS | Status: AC
  Administered 2022-01-14: 2 g via INTRAVENOUS
  Filled 2022-01-14: qty 50

## 2022-01-14 MED ORDER — ENOXAPARIN SODIUM 80 MG/0.8ML IJ SOSY
0.5000 mg/kg | PREFILLED_SYRINGE | INTRAMUSCULAR | Status: DC
  Administered 2022-01-14: 80 mg via SUBCUTANEOUS
  Filled 2022-01-14: qty 0.8

## 2022-01-14 NOTE — ED Notes (Signed)
Pt had a soft BM, pt was cleaned up and multiple layers of chux removed underneath pt and given to family because it belongs to them.  Pt has multiple bed sores in various stages on buttock and inner groin area, daughter states these have been there and are improving and wound care is involved in this. This RN asked if pt cab be turned on her side and daughter stated that she cannot be due to leg issues.   New purewick and old one discarded due to it being soiled.

## 2022-01-14 NOTE — Assessment & Plan Note (Signed)
Potassium of 2.7 with magnesium of 1.6 this morning. -Replete electrolytes and monitor

## 2022-01-14 NOTE — Assessment & Plan Note (Signed)
Hemoglobin at 7.6 today, baseline appears to be between 7-8.  Initial reading of 8.2 might be little hemoconcentration as she appears little dry on presentation. -Anemia panel -Continue to monitor -Transfuse if below 7

## 2022-01-14 NOTE — Assessment & Plan Note (Signed)
-  Continue home Paxil ?

## 2022-01-14 NOTE — Assessment & Plan Note (Signed)
Blood pressure within goal. -Continue home midodrine

## 2022-01-14 NOTE — Assessment & Plan Note (Signed)
Estimated body mass index is 69.03 kg/m as calculated from the following:   Height as of 07/01/21: 5\' 6"  (1.676 m).   Weight as of 11/02/21: 194 kg.   Patient with severe lymphedema and bedbound at baseline. -This will complicate overall prognosis

## 2022-01-14 NOTE — ED Notes (Signed)
2 RN's and paramedic changed pt before transport. Purewick removed. Pt had another BM. Transport request placed.

## 2022-01-14 NOTE — H&P (Signed)
History and Physical    PLEASE NOTE THAT DRAGON DICTATION SOFTWARE WAS USED IN THE CONSTRUCTION OF THIS NOTE.   Kayla Long ALP:[id number] DOB: Dec 18, 1945 DOA: 01/13/2022  PCP: Erline Levine, MD  Patient coming from: home   I have personally briefly reviewed patient's old medical records in [place]  Chief Complaint: Altered mental status  HPI: Kayla Long is a 76 y.o. female with medical history significant for chronic lymphedema, chronic venous insufficiency, severe morbid obesity, chronic anemia with baseline hemoglobin 7-8.5, who is admitted to [facility] on 01/13/2022 with acute metabolic encephalopathy in the setting of urinary tract infection after presenting from home to [facility] ED for evaluation of altered mental status.   In the setting of the patient's altered mental status, the following history is provided by the patient's family as well as my discussions with the EDP and via chart review.  Patient has reportedly exhibited 3 to 4 days of progressive confusion relative to her altered mental status.  In that setting, over the last 2 3 days, she has exhibited significant decline in her oral intake of both food and water.  No recent trauma.  No overt vomiting or rash.  Family conveys patient's history of chronic lymphedema, without any significant recent worsening thereof.  Family also notes patient's history of chronic sacral decubitus ulcer, with chart review revealing documentation of stage II sacral decubitus ulcer 2 months ago.  Medical history notable for chronic anemia with baseline hemoglobin range 7-8.5, with most recent prior hemoglobin noted to be 8.1 on 11/05/2021.  Additionally, per chart review, patient has a history of borderline hypotension, on midodrine as an outpatient.    ED Course:  Vital signs in the ED were notable for the following: Afebrile; initial heart rate 121, which decreased to 91 following interval initiation of IV fluids, as further  detailed below; initial blood pressure 94/50 which subsequently increased to 113/49 following interval IV fluids; respiratory rate 14-20, oxygen saturation 100% on room air.  Labs were notable for the following: CMP was notable for the following: Potassium 2.2, creatinine 1.14, BUN to creatinine ratio 22, calcium, corrected for mild hypoalbuminemia noted to be 9.4, albumin 1.7, AST 12, ALT 8, total bilirubin 0.4.  CBC notable for the following: Lipid cell count 10,300, hemoglobin 8.2 associated normocytic/normochromic findings.  Urinalysis notable for demonstrating greater than 50 white blood cells, many bacteria, large leukocyte esterase, no squamous epithelial cells.  Serum ethanol level less than 10.  COVID-19/influenza PCR negative.  Blood cultures x2 and urine culture collected prior to initiation of IV antibiotics.  Imaging and additional notable ED work-up: EKG, in comparison to most recent prior performed on 10/30/2021 showed sinus tachycardia with heart rate 112, nonspecific T wave flattening in leads III and aVF, which appears unchanged relative to most recent prior EKG, as well as less than 1 mm ST depression in V2, which also appears unchanged relative to most recent prior EKG, will demonstrating no evidence of ST elevation.  Chest x-ray shows right pleural effusion with associated basilar opacity, potentially representing atelectasis versus infiltrate, also demonstrating low lung volumes.  While in the ED, the following were administered: Potassium chloride 40 mill equivalents p.o. x1 dose, potassium chloride 40 mill equivalents IV over 4 hours x 1 dose, Rocephin, normal saline x1 L bolus.  Subsequently, the patient was admitted for further evaluation and management of presenting acute metabolic encephalopathy in the setting of suspected urinary tract infection, with presentation also notable for hypokalemia as well  as evidence of dehydration, including finding of acute prerenal  azotemia.     Review of Systems: As per HPI otherwise 10 point review of systems negative.   Past Medical History:  Diagnosis Date   Chronic acquired lymphedema    Decubitus ulcer    Hx of colonic polyps    tubulovillous adenoma   Hyperlipidemia    IBS (irritable bowel syndrome)    Spinal stenosis    with secondary neuropathy    Past Surgical History:  Procedure Laterality Date   PARTIAL HYSTERECTOMY     vaginal deliveries     x2   VESICOVAGINAL FISTULA CLOSURE W/ TAH      Social History:  reports that she has quit smoking. Her smoking use included cigarettes. She has never used smokeless tobacco. She reports current alcohol use. She reports that she does not use drugs.   Allergies  Allergen Reactions   Fluoxetine Hcl     REACTION: headache    Family History  Problem Relation Age of Onset   Stroke Mother    Stroke Father    Heart disease Brother     Family history reviewed and not pertinent    Prior to Admission medications   Medication Sig Start Date End Date Taking? Authorizing Provider  albuterol (VENTOLIN HFA) 108 (90 Base) MCG/ACT inhaler Inhale 2 puffs into the lungs every 6 (six) hours as needed for wheezing or shortness of breath. 11/06/21  Yes Sreenath, Sudheer B, MD  furosemide (LASIX) 40 MG tablet Take 80 mg by mouth daily.     Yes [provider]  midodrine (PROAMATINE) 10 MG tablet Take 1 tablet (10 mg total) by mouth 3 (three) times daily as needed (for SBP <100). 11/06/21  Yes Sreenath, Sudheer B, MD  Oxycodone HCl 10 MG TABS Take 10 mg by mouth 3 (three) times daily as needed (severe pain).   Yes [provider]  pantoprazole (PROTONIX) 40 MG tablet Take 1 tablet (40 mg total) by mouth daily before breakfast. 07/11/21  Yes Amin, Ankit Chirag, MD  PARoxetine (PAXIL) 30 MG tablet Take 30 mg by mouth daily. 04/13/21  Yes [provider]  senna-docusate (SENOKOT-S) 8.6-50 MG tablet Take 1 tablet by mouth at bedtime as needed  for moderate constipation. 07/11/21  Yes Amin, Ankit Chirag, MD  SSD 1 % cream Apply 1 application topically daily. (Apply to cracks and affected areas of skin)   Yes [provider]     Objective    Physical Exam: Vitals:   01/13/22 2040 01/13/22 2110 01/13/22 2230 01/13/22 2355  BP: (!) 106/53 (!) 113/49 (!) 108/47 (!) 100/46  Pulse: (!) 122 97 95 94  Resp: (!) _0 Temp:      TempSrc:      SpO2: 100% 100% 100% 100%    General: appears to be stated age; confused Skin: warm, dry, no rash Head:  AT/[place] Mouth:  Oral mucosa membranes appear dry, normal dentition Neck: supple; trachea midline Heart:  RRR; did not appreciate any M/R/G Lungs: Diminished bibasilar breath sounds, but otherwise CTAB, did not appreciate any wheezes, rales, or rhonchi Abdomen: + BS; soft, ND, NT Vascular: 2+ pedal pulses b/l; 2+ radial pulses b/l Extremities: 2+ edema in bilateral lower extremities, no muscle wasting Neuro: In the setting of the patient's current mental status and associated inability to follow instructions, unable to perform full neurologic exam at this time.  As such, assessment of strength, sensation, and cranial nerves is limited  at this time. Patient noted to spontaneously move all 4 extremities. No tremors.     Labs on Admission: I have personally reviewed following labs and imaging studies  CBC: Recent Labs  Lab 01/13/22 2022  WBC 10.3  NEUTROABS 6.6  HGB 8.2*  HCT 26.2*  MCV 92.9  PLT 993   Basic Metabolic Panel: Recent Labs  Lab 01/13/22 2022 01/13/22 2200  NA 136  --   K 2.2*  --   CL 103  --   CO2 26  --   GLUCOSE 97  --   BUN 25*  --   CREATININE 1.14*  --   CALCIUM 7.6*  --   MG  --  1.8   GFR: CrCl cannot be calculated (Unknown ideal weight.). Liver Function Tests: Recent Labs  Lab 01/13/22 2022  AST 12*  ALT 8  ALKPHOS 104  BILITOT 0.4  PROT 5.4*  ALBUMIN 1.7*   No results for input(s): LIPASE, AMYLASE in the last 168  hours. No results for input(s): AMMONIA in the last 168 hours. Coagulation Profile: No results for input(s): INR, PROTIME in the last 168 hours. Cardiac Enzymes: No results for input(s): CKTOTAL, CKMB, CKMBINDEX, TROPONINI in the last 168 hours. BNP (last 3 results) No results for input(s): PROBNP in the last 8760 hours. HbA1C: No results for input(s): HGBA1C in the last 72 hours. CBG: No results for input(s): GLUCAP in the last 168 hours. Lipid Profile: No results for input(s): CHOL, HDL, LDLCALC, TRIG, CHOLHDL, LDLDIRECT in the last 72 hours. Thyroid Function Tests: No results for input(s): TSH, T4TOTAL, FREET4, T3FREE, THYROIDAB in the last 72 hours. Anemia Panel: No results for input(s): VITAMINB12, FOLATE, FERRITIN, TIBC, IRON, RETICCTPCT in the last 72 hours. Urine analysis:    Component Value Date/Time   COLORURINE YELLOW (A) 01/13/2022 2022   APPEARANCEUR TURBID (A) 01/13/2022 2022   LABSPEC 1.008 01/13/2022 2022   PHURINE 5.0 01/13/2022 2022   GLUCOSEU NEGATIVE 01/13/2022 2022   HGBUR MODERATE (A) 01/13/2022 2022   BILIRUBINUR NEGATIVE 01/13/2022 2022   KETONESUR NEGATIVE 01/13/2022 2022   PROTEINUR NEGATIVE 01/13/2022 2022   NITRITE NEGATIVE 01/13/2022 2022   LEUKOCYTESUR LARGE (A) 01/13/2022 2022    Radiological Exams on Admission: CT Head Wo Contrast  Result Date: 01/13/2022 CLINICAL DATA:  Mental status change, unknown cause EXAM: CT HEAD WITHOUT CONTRAST TECHNIQUE: Contiguous axial images were obtained from the base of the skull through the vertex without intravenous contrast. RADIATION DOSE REDUCTION: This exam was performed according to the departmental dose-optimization program which includes automated exposure control, adjustment of the mA and/or kV according to patient size and/or use of iterative reconstruction technique. COMPARISON:  10/23/2021 FINDINGS: Brain: Mild age related volume loss. No acute intracranial abnormality. Specifically, no hemorrhage,  hydrocephalus, mass lesion, acute infarction, or significant intracranial injury. Vascular: No hyperdense vessel or unexpected calcification. Skull: No acute calvarial abnormality. Sinuses/Orbits: No acute findings Other: None IMPRESSION: No acute intracranial abnormality. Electronically Signed   By: Rolm Baptise M.D.   On: 01/13/2022 21:51   DG Chest Port 1 View  Result Date: 01/13/2022 CLINICAL DATA:  Tachycardia. EXAM: PORTABLE CHEST 1 VIEW COMPARISON:  Radiograph 11/03/2021 FINDINGS: Low lung volumes persist. Upper normal heart size with stable mediastinal contours. There is a right pleural effusion with associated basilar opacity. Minor left lung base atelectasis. No pneumothorax. No acute osseous findings. Chronic right shoulder degenerative change. IMPRESSION: 1. Right pleural effusion with associated basilar opacity. 2. Low lung volumes. Electronically Signed  By: Keith Rake M.D.   On: 01/13/2022 21:32     EKG: Independently reviewed, with result as described above.    Assessment/Plan    Principal Problem:   Acute metabolic encephalopathy Active Problems:   Morbid (severe) obesity due to excess calories (HCC)   Hypokalemia   Depression   Acute cystitis   Dehydration   Acute prerenal azotemia   GERD (gastroesophageal reflux disease)   Anemia of chronic disease     #) Acute metabolic encephalopathy: 3 to 4 days of confusion relative to baseline mental status, which appears to be consistent with acute metabolic encephalopathy with contribution from physiologic stressors stemming from presenting urinary tract infection, as further detailed below.  No overt evidence of additional underlying infectious process at this time, including negative COVID-19/influenza PCR. however, chest x-ray revealed right pleural effusion with associated right basilar opacity, potentially representing atelectasis versus infiltrate.  In the setting of the aforementioned right pleural effusion as well  as concomitant low lung volumes, suspect that this right basilar opacity is more likely to represent atelectasis than infiltrate.  However, will further assess for any underlying pneumonia by checking procalcitonin, which, if elevated, will consider adding azithromycin to existing Rocephin coverage that has been ordered for UTI, which would complete community-acquired pneumonia coverage.   Potential additional metabolic contribution to the patient's acute encephalopathy in the form of clinical evidence of dehydration, as further detailed below.  Differential also includes the possibility of pharmacologic contributions, given the inclusion of several potentially central acting medications on her medication list, including prn 10 mg of oxycodone as well as Protonix in addition to Paxil. No overt acute focal neurologic deficits to suggest a contribution from an underlying acute CVA, while noncontrast CT head shows no evidence of acute intracranial process. Seizures are also felt to be less likely.  Particularly in the setting of the patient's body habitus, with increased risk for pickwickian physiology, will check VBG to evaluate for any contribution from hypercapnic encephalopathy.    Plan: fall precautions. Repeat CMP/CBC in the AM. Check magnesium level. check VBG, TSH, MMA, CPK, ionized calcium level. hold potential central-acting home medications, including oxycodone, Protonix, Paxil, to allow a washout period in order to reduce this potential confounding vs contributory pharmacologic variable.  Further evaluation management of suspected presenting UTI, as further detailed below.  Check procalcitonin to further evaluate for any contribution from right basilar pneumonia.  Incentive spirometry.      #) Urinary tract infection: In the setting of the patient's presenting acute encephalopathy, urinalysis consistent with UTI on the basis of the presence of significant pyuria, many bacteria, large leukocyte  Estrace, and no evidence of squamous epithelial cells to suggest a component of contamination.  Aside from the patient's mild tachycardia, which appears to be improving with IV fluids, suggestive of an element of dehydration, no additional SIRS criteria met at this time.  Therefore, criteria not currently met for sepsis.  In the ED today, blood cultures x2 as well as urine culture were collected prior to initiation of Rocephin for empiric coverage of suspected acute cystitis   Plan: Monitor results of blood cultures x2 as well as urine culture.  Continue Rocephin.  Repeat CBC differential in the morning.       #) Hypokalemia: Presenting serum potassium 2.2, likely with contribution from recent decline in oral intake, as further detailed above.  She is status post receipt of 40 mill colons of oral potassium in the ED this evening as well as  40 mEq of IV potassium.  We will provide additional IV potassium supplementation and check serum magnesium level, as further detailed below.  In the context of suspected intravascular depletion as a consequence of recent decline in oral intake, will hold home Lasix for now.   Plan: We will provide an additional 40 mill colons of IV potassium chloride over 4 hours x 1 dose now.  Add on serum magnesium level.  Monitor on symmetry.  Repeat BMP in the morning.  Hold home Lasix for now.        #) Dehydration: Clinical suspicion for such, including the appearance of dry oral mucous membranes as well as laboratory findings notable for acute prerenal azotemia in addition to improving tachycardia with IV fluids. Appears to be in the setting of   report of recent decline in oral intake.  While the patient has a history of chronic lymphedema, I suspect that there is an element of intravascular depletion, and that she would benefit from some gentle additional IV fluids, we will closely monitoring ensuing volume status.  Plan: Monitor strict I's and O's.  Daily  weights.  Repeat BMP in the morning.  We will provide some gentle additional IV fluids in the form of administration of 40 mill equivalents of additional IV potassium chloride over 4 hours.  Monitor on telemetry.       #) GERD: On Protonix as an outpatient.  Plan: In the setting of presenting acute encephalopathy, will hold home PPI for now.      #) Depression: Documented history of such, Paxil as an outpatient.  In the context of presenting acute encephalopathy, with plan to include holding of potential central acting medications to allow for associated elimination of founding variables posed by these medications, hold home Paxil for now.  Plan: Hold home Paxil for now, as above.       #) Severe morbid obesity: Per most recent recorded height and weight, BMI calculated to be 68.7.  Patient's body habitus notable in the context of presenting acute encephalopathy, raising the possibility of CO2 retention as a consequence of associated pickwickian physiology.  We will check VBG to further assess.  Additionally, will check TSH, including as component of further assessment for presenting acute encephalopathy.  Plan: Check VBG, TSH, as above.       #) Anemia of chronic disease: Documented history of such, with baseline hemoglobin noted to be 7-8.5, with presenting hemoglobin consistent with this range, in the absence of any evidence of acute bleed.  Presenting normocytic/normochromic findings consistent with anemia of chronic disease.  Plan: Repeat CBC the morning.  Check INR.      DVT prophylaxis: SCD's   Code Status: Full code Disposition Plan: Per Rounding Team Consults called: none;  Admission status: Inpatient;   PLEASE NOTE THAT DRAGON DICTATION SOFTWARE WAS USED IN THE CONSTRUCTION OF THIS NOTE.   Hosmer DO [facility] [place]   01/14/2022, 1:25 AM

## 2022-01-14 NOTE — ED Notes (Addendum)
Pt resting comfortably at this time. Pt given breakfast tray. Pt given fresh ice water and daughter also given cranberry juice. Pt denies any other needs. Call bell in reach.   Pt is slightly hypotensive but pt and daughter state this is chronic, midodrine to be given.   Pt updated on admission status.

## 2022-01-14 NOTE — Assessment & Plan Note (Signed)
Resolved.  Most likely secondary to UTI. Now appears to be at baseline. -Continue to monitor

## 2022-01-14 NOTE — Progress Notes (Signed)
Progress Note   Patient: Kayla Long CBJ:[id number] DOB: 04/26/1946 DOA: 01/13/2022     1 DOS: the patient was seen and examined on 01/14/2022   Brief hospital course: Taken from H&P.   JACQUE BYRON is a 76 y.o. female with medical history significant for chronic lymphedema, chronic venous insufficiency, severe morbid obesity, chronic anemia with baseline hemoglobin 7-8.5, who is admitted to [facility] on 01/13/2022 with acute metabolic encephalopathy in the setting of urinary tract infection after presenting from home to [facility] ED for evaluation of altered mental status.   At baseline patient is morbidly obese, severe lymphedema and bedbound at baseline.  History of pressure injuries.  Currently being treated at home by wound care for a pannus and left knee fold wound.  On arrival she was hemodynamically stable.  Labs pertinent for potassium of 2.2, mild hypocalcemia and hypomagnesemia.  UA concerning for UTI, pending urine cultures.  Blood cultures negative in 12 hours.  Mental status now improved to baseline.  Patient was started on ceftriaxone and getting electrolyte replacement   Assessment and Plan: * Acute metabolic encephalopathy- (present on admission) Resolved.  Most likely secondary to UTI. Now appears to be at baseline. -Continue to monitor  Acute cystitis- (present on admission) UA concerning for UTI.  Urine cultures pending.  Initial blood cultures negative. -Continue with ceftriaxone -Follow-up urinary cultures  Hypokalemia- (present on admission) Potassium of 2.7 with magnesium of 1.6 this morning. -Replete electrolytes and monitor  Morbid (severe) obesity due to excess calories ([place])- (present on admission) Estimated body mass index is 69.03 kg/m as calculated from the following:   Height as of 07/01/21: 5\' 6"  (1.676 m).   Weight as of 11/02/21: 194 kg.   Patient with severe lymphedema and bedbound at baseline. -This will complicate overall  prognosis  Hypotension- (present on admission) Blood pressure within goal. -Continue home midodrine  Depression- (present on admission) - Continue home Paxil  GERD (gastroesophageal reflux disease)- (present on admission) - Continue PPI  Anemia of chronic disease- (present on admission) Hemoglobin at 7.6 today, baseline appears to be between 7-8.  Initial reading of 8.2 might be little hemoconcentration as she appears little dry on presentation. -Anemia panel -Continue to monitor -Transfuse if below 7  CKD (chronic kidney disease), stage III ([place])- (present on admission) Creatinine seems improved then her baseline. -Monitor renal function -Avoid nephrotoxins     Subjective: Patient was seen and examined today.  No complaints.  Daughter at bedside and according to her she appears to be at baseline.  History of diarrhea few days ago which has been resolved.  Physical Exam: Vitals:   01/14/22 0700 01/14/22 0900 01/14/22 1100 01/14/22 1200  BP: (!) 102/44 (!) 107/44 (!) 101/49 113/61  Pulse: 85 82 86 99  Resp: (!) 22 (!) 22 13 20   Temp:    98 F (36.7 C)  TempSrc:    Oral  SpO2: 100% 100% 100% 100%   General.  Morbidly obese elderly lady, in no acute distress. Pulmonary.  Lungs clear bilaterally, normal respiratory effort. CV.  Regular rate and rhythm, no JVD, rub or murmur. Abdomen.  Soft, nontender, nondistended, BS positive. CNS.  Alert and oriented .  No apparent focal neurologic deficit. Extremities.  Severe lymphedema, unable to palpate any pulses. Psychiatry.  Appears to have mild cognitive impairment  Data Reviewed: Prior notes, labs and images reviewed.  Family Communication: Discussed with daughter at bedside  Disposition: Status is: Inpatient Remains inpatient appropriate because: Severity of illness  Planned Discharge Destination: Home with Home Health  DVT prophylaxis.  Lovenox  Time spent: 45 minutes  This record has been created using Actor. Errors have been sought and corrected,but may not always be located. Such creation errors do not reflect on the standard of care.  Author: Lorella Nimrod, MD 01/14/2022 1:50 PM  For on call review [email or web address].

## 2022-01-14 NOTE — Assessment & Plan Note (Signed)
Creatinine seems improved then her baseline. -Monitor renal function -Avoid nephrotoxins

## 2022-01-14 NOTE — Assessment & Plan Note (Signed)
UA concerning for UTI.  Urine cultures pending.  Initial blood cultures negative. -Continue with ceftriaxone -Follow-up urinary cultures

## 2022-01-14 NOTE — Assessment & Plan Note (Signed)
Continue PPI ?

## 2022-01-14 NOTE — Hospital Course (Addendum)
Taken from H&P.   Kayla Long is a 76 y.o. female with medical history significant for chronic lymphedema, chronic venous insufficiency, severe morbid obesity, chronic anemia with baseline hemoglobin 7-8.5, who is admitted to [facility] on 01/13/2022 with acute metabolic encephalopathy in the setting of urinary tract infection after presenting from home to [facility] ED for evaluation of altered mental status.   At baseline patient is morbidly obese, severe lymphedema and bedbound at baseline.  History of pressure injuries.  Currently being treated at home by wound care for a pannus and left knee fold wound.  On arrival she was hemodynamically stable.  Labs pertinent for potassium of 2.2, mild hypocalcemia and hypomagnesemia.  UA concerning for UTI, pending urine cultures.  Blood cultures negative in 2 days.  Mental status now improved to baseline.  Patient was started on ceftriaxone and getting electrolyte replacement

## 2022-01-14 NOTE — Plan of Care (Signed)

## 2022-01-14 NOTE — Progress Notes (Signed)
PHARMACIST - PHYSICIAN COMMUNICATION  CONCERNING:  Enoxaparin (Lovenox) for DVT Prophylaxis    RECOMMENDATION: Patient was prescribed enoxaprin for VTE prophylaxis.   Filed Weights   01/14/22 1402  Weight: (!) 162.2 kg (357 lb 9.4 oz)    Body mass index is 57.72 kg/m.  Estimated Creatinine Clearance: 76.4 mL/min (A) (by C-G formula based on SCr of 1.04 mg/dL (H)).   Based on [place] patient is candidate for enoxaparin 0.5mg /kg TBW SQ every 24 hours based on BMI being >30.  DESCRIPTION: Pharmacy has adjusted enoxaparin dose per [facility] policy.  Patient is now receiving enoxaparin 0.5 mg/kg every 24 hours    Dallie Piles, PharmD Clinical Pharmacist  01/14/2022 2:31 PM

## 2022-01-15 DIAGNOSIS — L8990 Pressure ulcer of unspecified site, unspecified stage: Secondary | ICD-10-CM | POA: Insufficient documentation

## 2022-01-15 DIAGNOSIS — G9341 Metabolic encephalopathy: Secondary | ICD-10-CM | POA: Diagnosis not present

## 2022-01-15 LAB — BASIC METABOLIC PANEL
Anion gap: 8 (ref 5–15)
BUN: 23 mg/dL (ref 8–23)
CO2: 25 mmol/L (ref 22–32)
Calcium: 7.4 mg/dL — ABNORMAL LOW (ref 8.9–10.3)
Chloride: 106 mmol/L (ref 98–111)
Creatinine, Ser: 1.16 mg/dL — ABNORMAL HIGH (ref 0.44–1.00)
GFR, Estimated: 50 mL/min — ABNORMAL LOW (ref >60)
Glucose, Bld: 73 mg/dL (ref 70–99)
Potassium: 3.2 mmol/L — ABNORMAL LOW (ref 3.5–5.1)
Sodium: 139 mmol/L (ref 135–145)

## 2022-01-15 LAB — CBC
HCT: 25.4 % — ABNORMAL LOW (ref 36.0–46.0)
Hemoglobin: 8.1 g/dL — ABNORMAL LOW (ref 12.0–15.0)
MCH: 28.9 pg (ref 26.0–34.0)
MCHC: 31.9 g/dL (ref 30.0–36.0)
MCV: 90.7 fL (ref 80.0–100.0)
Platelets: 262 K/uL (ref 150–400)
RBC: 2.8 MIL/uL — ABNORMAL LOW (ref 3.87–5.11)
RDW: 16.2 % — ABNORMAL HIGH (ref 11.5–15.5)
WBC: 9 K/uL (ref 4.0–10.5)
nRBC: 0 % (ref 0.0–0.2)

## 2022-01-15 LAB — CALCIUM, IONIZED: Calcium, Ionized, Serum: 4.7 mg/dL (ref 4.5–5.6)

## 2022-01-15 LAB — VITAMIN B12: Vitamin B-12: 389 pg/mL (ref 180–914)

## 2022-01-15 MED ORDER — TAB-A-VITE/IRON PO TABS: 1.0000 | ORAL_TABLET | Freq: Every day | ORAL | 0 refills | Status: DC

## 2022-01-15 MED ORDER — POTASSIUM CHLORIDE 20 MEQ PO PACK
40.0000 meq | PACK | Freq: Once | ORAL | Status: AC
  Administered 2022-01-15: 40 meq via ORAL
  Filled 2022-01-15: qty 2

## 2022-01-15 MED ORDER — TAB-A-VITE/IRON PO TABS
1.0000 | ORAL_TABLET | Freq: Every day | ORAL | Status: DC
  Filled 2022-01-15: qty 1

## 2022-01-15 MED ORDER — FOLIC ACID 1 MG PO TABS
1.0000 mg | ORAL_TABLET | Freq: Every day | ORAL | Status: DC
  Administered 2022-01-15: 1 mg via ORAL
  Filled 2022-01-15: qty 1

## 2022-01-15 MED ORDER — FOLIC ACID 1 MG PO TABS: 1.0000 mg | ORAL_TABLET | Freq: Every day | ORAL | 0 refills | Status: DC

## 2022-01-15 MED ORDER — SODIUM CHLORIDE 0.9 % IV SOLN: INTRAVENOUS | Status: AC

## 2022-01-15 MED ORDER — ENOXAPARIN SODIUM 80 MG/0.8ML IJ SOSY: 0.5000 mg/kg | PREFILLED_SYRINGE | INTRAMUSCULAR | Status: DC

## 2022-01-15 MED ORDER — FUROSEMIDE 40 MG PO TABS: 80.0000 mg | ORAL_TABLET | Freq: Every day | ORAL | Status: DC

## 2022-01-15 MED ORDER — CEPHALEXIN 500 MG PO CAPS: 500.0000 mg | ORAL_CAPSULE | Freq: Four times a day (QID) | ORAL | 0 refills | Status: AC

## 2022-01-15 NOTE — TOC Transition Note (Signed)
Transition of Care [facility]) - CM/SW Discharge Note   Patient Details  Name: Kayla Long MRN: [id number] Date of Birth: February 05, 1946  Transition of Care [facility]) CM/SW Contact:  Kerin Salen, RN Phone Number: 01/15/2022, 3:03 PM   Clinical Narrative: Patient to discharge home with [facility].     Final next level of care: Home/Self Care Barriers to Discharge: Barriers Resolved   Patient Goals and CMS Choice Patient states their goals for this hospitalization and ongoing recovery are:: To return home.      Discharge Placement                  Name of family member notified: Daughter [name] Patient and family notified of of transfer: 01/15/22  Discharge Plan and Services                                     Social Determinants of Health (SDOH) Interventions     Readmission Risk Interventions Readmission Risk Prevention Plan 10/24/2021  Transportation Screening Complete  PCP or Specialist Appt within 3-5 Days Complete  HRI or Railroad Complete  Social Work Consult for [place] Planning/Counseling Complete  Palliative Care Screening Complete  Medication Review [employer]) Complete  Some recent data might be hidden

## 2022-01-15 NOTE — Progress Notes (Signed)
PIV removed. Discharge instructions completed with patient and daughter. Patient verbalized understanding of medication regimen, follow up appointments and discharge instructions. Patient belongings gathered and packed to discharge. Waiting for EMS transport.

## 2022-01-15 NOTE — Discharge Summary (Signed)
Physician Discharge Summary   Patient: Kayla Long MRN: [id number] DOB: [birth year]  Admit date:     01/13/2022  Discharge date: 01/15/22  Discharge Physician: Lorella Nimrod   PCP: Erline Levine, MD   Recommendations at discharge:  Please obtain CBC and BMP in 1 week Please follow final urine culture results Follow-up with primary care doctor in 1 week.  Discharge Diagnoses: Principal Problem:   Acute metabolic encephalopathy Active Problems:   Acute cystitis   Hypokalemia   Morbid (severe) obesity due to excess calories (HCC)   Hypotension   Depression   GERD (gastroesophageal reflux disease)   Anemia of chronic disease   CKD (chronic kidney disease), stage III (HCC)   Dehydration   Acute prerenal azotemia   Pressure injury of skin  Hospital Course: Taken from H&P.   Kayla Long is a 76 y.o. female with medical history significant for chronic lymphedema, chronic venous insufficiency, severe morbid obesity, chronic anemia with baseline hemoglobin 7-8.5, who is admitted to [facility] on 01/13/2022 with acute metabolic encephalopathy in the setting of urinary tract infection after presenting from home to [facility] ED for evaluation of altered mental status.   At baseline patient is morbidly obese, severe lymphedema and bedbound at baseline.  History of pressure injuries.  Currently being treated at home by wound care for a pannus and left knee fold wound.  On arrival she was hemodynamically stable.  Labs pertinent for potassium of 2.2, mild hypocalcemia and hypomagnesemia.  UA concerning for UTI, pending urine cultures.  Blood cultures negative in 2 days.  Mental status now improved to baseline.  Patient was started on ceftriaxone and getting electrolyte replacement  Final urine culture results are still pending, remained afebrile and appears at her baseline.  She is bedbound with extreme lymphedema at baseline.  She was discharged on Keflex and will need a change of  antibiotics if found to have some resistant bacteria.  All of her electrolytes were repleted before discharge.  She was also found to have elevated TSH at 6.9.  She needs to have a repeat done by PCP and started on Synthroid if needed.  Patient was having some chronic wound in her pannus and knee folds, no concern of acute infection.  She will continue her wound care as she was doing it before.  She will continue rest of her home medications and follow-up with her providers.  Assessment and Plan: * Acute metabolic encephalopathy- (present on admission) Resolved.  Most likely secondary to UTI. Now appears to be at baseline. -Continue to monitor  Acute cystitis- (present on admission) UA concerning for UTI.  Urine cultures pending.  Initial blood cultures negative. -Continue with ceftriaxone -Follow-up urinary cultures  Hypokalemia- (present on admission) Potassium of 2.7 with magnesium of 1.6 this morning. -Replete electrolytes and monitor  Morbid (severe) obesity due to excess calories ([place])- (present on admission) Estimated body mass index is 69.03 kg/m as calculated from the following:   Height as of 07/01/21: 5\' 6"  (1.676 m).   Weight as of 11/02/21: 194 kg.   Patient with severe lymphedema and bedbound at baseline. -This will complicate overall prognosis  Hypotension- (present on admission) Blood pressure within goal. -Continue home midodrine  Depression- (present on admission) - Continue home Paxil  GERD (gastroesophageal reflux disease)- (present on admission) - Continue PPI  Anemia of chronic disease- (present on admission) Hemoglobin at 7.6 today, baseline appears to be between 7-8.  Initial reading of 8.2 might be little hemoconcentration as she  appears little dry on presentation. -Anemia panel -Continue to monitor -Transfuse if below 7  CKD (chronic kidney disease), stage III ([place])- (present on admission) Creatinine seems improved then her  baseline. -Monitor renal function -Avoid nephrotoxins   Consultants: None Procedures performed: None Disposition: Home health Diet recommendation:  Discharge Diet Orders (From admission, onward)     Start     Ordered   01/15/22 0000  Diet - low sodium heart healthy        01/15/22 1434           Cardiac diet  DISCHARGE MEDICATION: Allergies as of 01/15/2022       Reactions   Fluoxetine Hcl    REACTION: headache        Medication List     TAKE these medications    albuterol 108 (90 Base) MCG/ACT inhaler Commonly known as: VENTOLIN HFA Inhale 2 puffs into the lungs every 6 (six) hours as needed for wheezing or shortness of breath.   cephALEXin 500 MG capsule Commonly known as: KEFLEX Take 1 capsule (500 mg total) by mouth 4 (four) times daily for 5 days.   folic acid 1 MG tablet Commonly known as: FOLVITE Take 1 tablet (1 mg total) by mouth daily. Start taking on: January 16, 2022   furosemide 40 MG tablet Commonly known as: LASIX Take 2 tablets (80 mg total) by mouth daily. Hold for the next couple of days and then resume it What changed: additional instructions   midodrine 10 MG tablet Commonly known as: PROAMATINE Take 1 tablet (10 mg total) by mouth 3 (three) times daily as needed (for SBP <100).   multivitamins with iron Tabs tablet Take 1 tablet by mouth daily. Start taking on: January 16, 2022   Oxycodone HCl 10 MG Tabs Take 10 mg by mouth 3 (three) times daily as needed (severe pain).   pantoprazole 40 MG tablet Commonly known as: PROTONIX Take 1 tablet (40 mg total) by mouth daily before breakfast.   PARoxetine 30 MG tablet Commonly known as: PAXIL Take 30 mg by mouth daily.   senna-docusate 8.6-50 MG tablet Commonly known as: Senokot-S Take 1 tablet by mouth at bedtime as needed for moderate constipation.   SSD 1 % cream Generic drug: silver sulfADIAZINE Apply 1 application topically daily. (Apply to cracks and affected areas of  skin)               Discharge Care Instructions  (From admission, onward)           Start     Ordered   01/15/22 0000  Discharge wound care:       Comments: Please continue with your existing wound care   01/15/22 1434            Follow-up Information     Kimel-Scott, Santiago Glad, MD. Schedule an appointment as soon as possible for a visit in 1 week(s).   Specialty: Internal Medicine Contact information: [address] [place] [address] [phone]                 Discharge Exam: Danley Danker Weights   01/14/22 1402 01/15/22 0500  Weight: (!) 162.2 kg (!) 160.7 kg   General.morbidly obese elderly lady, in no acute distress. Pulmonary.  Lungs clear bilaterally, normal respiratory effort. CV.  Regular rate and rhythm, no JVD, rub or murmur. Abdomen.  Soft, nontender, nondistended, BS positive. CNS.  Alert and oriented .  No focal neurologic deficit. Extremities.  Severe  bilateral lymphedema.  Condition at discharge: stable  The results of significant diagnostics from this hospitalization (including imaging, microbiology, ancillary and laboratory) are listed below for reference.   Imaging Studies: CT Head Wo Contrast  Result Date: 01/13/2022 CLINICAL DATA:  Mental status change, unknown cause EXAM: CT HEAD WITHOUT CONTRAST TECHNIQUE: Contiguous axial images were obtained from the base of the skull through the vertex without intravenous contrast. RADIATION DOSE REDUCTION: This exam was performed according to the departmental dose-optimization program which includes automated exposure control, adjustment of the mA and/or kV according to patient size and/or use of iterative reconstruction technique. COMPARISON:  10/23/2021 FINDINGS: Brain: Mild age related volume loss. No acute intracranial abnormality. Specifically, no hemorrhage, hydrocephalus, mass lesion, acute infarction, or significant intracranial injury. Vascular: No hyperdense vessel or  unexpected calcification. Skull: No acute calvarial abnormality. Sinuses/Orbits: No acute findings Other: None IMPRESSION: No acute intracranial abnormality. Electronically Signed   By: Rolm Baptise M.D.   On: 01/13/2022 21:51   DG Chest Port 1 View  Result Date: 01/13/2022 CLINICAL DATA:  Tachycardia. EXAM: PORTABLE CHEST 1 VIEW COMPARISON:  Radiograph 11/03/2021 FINDINGS: Low lung volumes persist. Upper normal heart size with stable mediastinal contours. There is a right pleural effusion with associated basilar opacity. Minor left lung base atelectasis. No pneumothorax. No acute osseous findings. Chronic right shoulder degenerative change. IMPRESSION: 1. Right pleural effusion with associated basilar opacity. 2. Low lung volumes. Electronically Signed   By: Keith Rake M.D.   On: 01/13/2022 21:32    Microbiology: Results for orders placed or performed during the hospital encounter of 01/13/22  Resp Panel by RT-PCR (Flu A&B, Covid) Nasopharyngeal Swab     Status: None   Collection Time: 01/13/22  8:22 PM   Specimen: Nasopharyngeal Swab; Nasopharyngeal(NP) swabs in vial transport medium  Result Value Ref Range Status   SARS Coronavirus 2 by RT PCR NEGATIVE NEGATIVE Final    Comment: (NOTE) SARS-CoV-2 target nucleic acids are NOT DETECTED.  The SARS-CoV-2 RNA is generally detectable in upper respiratory specimens during the acute phase of infection. The lowest concentration of SARS-CoV-2 viral copies this assay can detect is 138 copies/mL. A negative result does not preclude SARS-Cov-2 infection and should not be used as the sole basis for treatment or other patient management decisions. A negative result may occur with  improper specimen collection/handling, submission of specimen other than nasopharyngeal swab, presence of viral mutation(s) within the areas targeted by this assay, and inadequate number of viral copies(<138 copies/mL). A negative result must be combined with clinical  observations, patient history, and epidemiological information. The expected result is Negative.  Fact Sheet for Patients:  EntrepreneurPulse.com.au  Fact Sheet for Healthcare Providers:  IncredibleEmployment.be  This test is no t yet approved or cleared by the [place] FDA and  has been authorized for detection and/or diagnosis of SARS-CoV-2 by FDA under an Emergency Use Authorization (EUA). This EUA will remain  in effect (meaning this test can be used) for the duration of the COVID-19 declaration under Section 564(b)(1) of the Act, 21 U.S.C.section 360bbb-3(b)(1), unless the authorization is terminated  or revoked sooner.       Influenza A by PCR NEGATIVE NEGATIVE Final   Influenza B by PCR NEGATIVE NEGATIVE Final    Comment: (NOTE) The Xpert Xpress SARS-CoV-2/FLU/RSV plus assay is intended as an aid in the diagnosis of influenza from Nasopharyngeal swab specimens and should not be used as a sole basis for treatment. Nasal washings and aspirates are unacceptable for  Xpert Xpress SARS-CoV-2/FLU/RSV testing.  Fact Sheet for Patients: EntrepreneurPulse.com.au  Fact Sheet for Healthcare Providers: IncredibleEmployment.be  This test is not yet approved or cleared by the [place] FDA and has been authorized for detection and/or diagnosis of SARS-CoV-2 by FDA under an Emergency Use Authorization (EUA). This EUA will remain in effect (meaning this test can be used) for the duration of the COVID-19 declaration under Section 564(b)(1) of the Act, 21 U.S.C. section 360bbb-3(b)(1), unless the authorization is terminated or revoked.  Performed at [facility], [address] [place], [place] [address]   Culture, blood (routine x 2)     Status: None (Preliminary result)   Collection Time: 01/13/22  9:34 PM   Specimen: BLOOD  Result Value Ref Range Status   Specimen Description BLOOD BLOOD  RIGHT HAND  Final   Special Requests   Final    BOTTLES DRAWN AEROBIC AND ANAEROBIC Blood Culture adequate volume   Culture   Final    NO GROWTH 2 DAYS Performed at [facility], [address] [place], [place] [address]    Report Status PENDING  Incomplete  Culture, blood (routine x 2)     Status: None (Preliminary result)   Collection Time: 01/13/22  9:34 PM   Specimen: BLOOD  Result Value Ref Range Status   Specimen Description BLOOD BLOOD LEFT WRIST  Final   Special Requests   Final    BOTTLES DRAWN AEROBIC AND ANAEROBIC Blood Culture adequate volume   Culture   Final    NO GROWTH 2 DAYS Performed at [facility], [address] [place], [place] [address]    Report Status PENDING  Incomplete    Labs: CBC: Recent Labs  Lab 01/13/22 2022 01/14/22 0605 01/15/22 0500  WBC 10.3 9.1 9.0  NEUTROABS 6.6 5.1  --   HGB 8.2* 7.6* 8.1*  HCT 26.2* 24.3* 25.4*  MCV 92.9 92.7 90.7  PLT 247 230 194   Basic Metabolic Panel: Recent Labs  Lab 01/13/22 2022 01/13/22 2200 01/14/22 0605 01/15/22 0500  NA 136  --  135 139  K 2.2*  --  2.7* 3.2*  CL 103  --  105 106  CO2 26  --  24 25  GLUCOSE 97  --  77 73  BUN 25*  --  25* 23  CREATININE 1.14*  --  1.04* 1.16*  CALCIUM 7.6*  --  7.0* 7.4*  MG  --  1.8 1.6*  --    Liver Function Tests: Recent Labs  Lab 01/13/22 2022 01/14/22 0605  AST 12* 12*  ALT 8 8  ALKPHOS 104 91  BILITOT 0.4 0.3  PROT 5.4* 4.8*  ALBUMIN 1.7* 1.6*   CBG: No results for input(s): GLUCAP in the last 168 hours.  Discharge time spent: greater than 30 minutes.  Signed: Lorella Nimrod, MD [facility] 01/15/2022

## 2022-01-16 LAB — URINE CULTURE: Culture: NO GROWTH

## 2022-01-18 LAB — CULTURE, BLOOD (ROUTINE X 2)
Culture: NO GROWTH
Culture: NO GROWTH
Special Requests: ADEQUATE
Special Requests: ADEQUATE

## 2022-01-25 LAB — METHYLMALONIC ACID, SERUM: Methylmalonic Acid, Quantitative: 323 nmol/L (ref 0–378)

## 2022-08-15 ENCOUNTER — Emergency Department

## 2022-08-15 ENCOUNTER — Inpatient Hospital Stay
Admission: EM | Admit: 2022-08-15 | Discharge: 2022-08-16 | DRG: 951 | Disposition: A | Discharge status: Hospice | Attending: Internal Medicine | Admitting: Internal Medicine

## 2022-08-15 DIAGNOSIS — Z20822 Contact with and (suspected) exposure to covid-19: Secondary | ICD-10-CM | POA: Diagnosis present

## 2022-08-15 DIAGNOSIS — E875 Hyperkalemia: Secondary | ICD-10-CM | POA: Diagnosis present

## 2022-08-15 DIAGNOSIS — I959 Hypotension, unspecified: Secondary | ICD-10-CM | POA: Diagnosis present

## 2022-08-15 DIAGNOSIS — I9589 Other hypotension: Secondary | ICD-10-CM | POA: Diagnosis not present

## 2022-08-15 DIAGNOSIS — Z6839 Body mass index (BMI) 39.0-39.9, adult: Secondary | ICD-10-CM

## 2022-08-15 DIAGNOSIS — I4892 Unspecified atrial flutter: Secondary | ICD-10-CM | POA: Diagnosis present

## 2022-08-15 DIAGNOSIS — Z515 Encounter for palliative care: Secondary | ICD-10-CM | POA: Diagnosis not present

## 2022-08-15 DIAGNOSIS — R569 Unspecified convulsions: Secondary | ICD-10-CM | POA: Diagnosis present

## 2022-08-15 DIAGNOSIS — R6520 Severe sepsis without septic shock: Secondary | ICD-10-CM | POA: Diagnosis present

## 2022-08-15 DIAGNOSIS — Z79899 Other long term (current) drug therapy: Secondary | ICD-10-CM

## 2022-08-15 DIAGNOSIS — A419 Sepsis, unspecified organism: Secondary | ICD-10-CM | POA: Diagnosis present

## 2022-08-15 DIAGNOSIS — Z90711 Acquired absence of uterus with remaining cervical stump: Secondary | ICD-10-CM | POA: Diagnosis not present

## 2022-08-15 DIAGNOSIS — N1831 Chronic kidney disease, stage 3a: Secondary | ICD-10-CM | POA: Diagnosis present

## 2022-08-15 DIAGNOSIS — E785 Hyperlipidemia, unspecified: Secondary | ICD-10-CM | POA: Diagnosis present

## 2022-08-15 DIAGNOSIS — I890 Lymphedema, not elsewhere classified: Secondary | ICD-10-CM | POA: Diagnosis present

## 2022-08-15 DIAGNOSIS — Z66 Do not resuscitate: Secondary | ICD-10-CM | POA: Diagnosis present

## 2022-08-15 DIAGNOSIS — I130 Hypertensive heart and chronic kidney disease with heart failure and stage 1 through stage 4 chronic kidney disease, or unspecified chronic kidney disease: Secondary | ICD-10-CM | POA: Diagnosis present

## 2022-08-15 DIAGNOSIS — J9601 Acute respiratory failure with hypoxia: Secondary | ICD-10-CM | POA: Diagnosis present

## 2022-08-15 DIAGNOSIS — K219 Gastro-esophageal reflux disease without esophagitis: Secondary | ICD-10-CM | POA: Diagnosis present

## 2022-08-15 DIAGNOSIS — L89300 Pressure ulcer of unspecified buttock, unstageable: Secondary | ICD-10-CM | POA: Diagnosis not present

## 2022-08-15 DIAGNOSIS — Z823 Family history of stroke: Secondary | ICD-10-CM | POA: Diagnosis not present

## 2022-08-15 DIAGNOSIS — D696 Thrombocytopenia, unspecified: Secondary | ICD-10-CM | POA: Diagnosis present

## 2022-08-15 DIAGNOSIS — Z87891 Personal history of nicotine dependence: Secondary | ICD-10-CM

## 2022-08-15 DIAGNOSIS — E43 Unspecified severe protein-calorie malnutrition: Secondary | ICD-10-CM | POA: Diagnosis present

## 2022-08-15 DIAGNOSIS — N189 Chronic kidney disease, unspecified: Secondary | ICD-10-CM | POA: Diagnosis not present

## 2022-08-15 DIAGNOSIS — Z8616 Personal history of COVID-19: Secondary | ICD-10-CM

## 2022-08-15 DIAGNOSIS — L8990 Pressure ulcer of unspecified site, unspecified stage: Secondary | ICD-10-CM | POA: Diagnosis present

## 2022-08-15 DIAGNOSIS — Z8249 Family history of ischemic heart disease and other diseases of the circulatory system: Secondary | ICD-10-CM | POA: Diagnosis not present

## 2022-08-15 DIAGNOSIS — N179 Acute kidney failure, unspecified: Secondary | ICD-10-CM | POA: Diagnosis present

## 2022-08-15 DIAGNOSIS — Z79890 Hormone replacement therapy (postmenopausal): Secondary | ICD-10-CM

## 2022-08-15 DIAGNOSIS — R4182 Altered mental status, unspecified: Secondary | ICD-10-CM

## 2022-08-15 DIAGNOSIS — Z7401 Bed confinement status: Secondary | ICD-10-CM

## 2022-08-15 DIAGNOSIS — I5032 Chronic diastolic (congestive) heart failure: Secondary | ICD-10-CM | POA: Diagnosis present

## 2022-08-15 LAB — CBC WITH DIFFERENTIAL/PLATELET
Abs Immature Granulocytes: 0.48 10*3/uL — ABNORMAL HIGH (ref 0.00–0.07)
Basophils Absolute: 0.1 10*3/uL (ref 0.0–0.1)
Basophils Relative: 0 %
Eosinophils Absolute: 0 10*3/uL (ref 0.0–0.5)
Eosinophils Relative: 0 %
HCT: 27.7 % — ABNORMAL LOW (ref 36.0–46.0)
Hemoglobin: 8.6 g/dL — ABNORMAL LOW (ref 12.0–15.0)
Immature Granulocytes: 2 %
Lymphocytes Relative: 4 %
Lymphs Abs: 0.9 10*3/uL (ref 0.7–4.0)
MCH: 27.6 pg (ref 26.0–34.0)
MCHC: 31 g/dL (ref 30.0–36.0)
MCV: 88.8 fL (ref 80.0–100.0)
Monocytes Absolute: 0.7 10*3/uL (ref 0.1–1.0)
Monocytes Relative: 4 %
Neutro Abs: 18.2 10*3/uL — ABNORMAL HIGH (ref 1.7–7.7)
Neutrophils Relative %: 90 %
Platelets: 138 10*3/uL — ABNORMAL LOW (ref 150–400)
RBC: 3.12 MIL/uL — ABNORMAL LOW (ref 3.87–5.11)
RDW: 20.2 % — ABNORMAL HIGH (ref 11.5–15.5)
WBC: 20.3 10*3/uL — ABNORMAL HIGH (ref 4.0–10.5)
nRBC: 0.4 % — ABNORMAL HIGH (ref 0.0–0.2)

## 2022-08-15 LAB — COMPREHENSIVE METABOLIC PANEL
ALT: 6 U/L (ref 0–44)
AST: 18 U/L (ref 15–41)
Albumin: <1.5 g/dL — ABNORMAL LOW (ref 3.5–5.0)
Alkaline Phosphatase: 139 U/L — ABNORMAL HIGH (ref 38–126)
Anion gap: 14 (ref 5–15)
BUN: 124 mg/dL — ABNORMAL HIGH (ref 8–23)
CO2: 11 mmol/L — ABNORMAL LOW (ref 22–32)
Calcium: 7.9 mg/dL — ABNORMAL LOW (ref 8.9–10.3)
Chloride: 112 mmol/L — ABNORMAL HIGH (ref 98–111)
Creatinine, Ser: 4.53 mg/dL — ABNORMAL HIGH (ref 0.44–1.00)
GFR, Estimated: 10 mL/min — ABNORMAL LOW (ref >60)
Glucose, Bld: 76 mg/dL (ref 70–99)
Potassium: 5.4 mmol/L — ABNORMAL HIGH (ref 3.5–5.1)
Sodium: 137 mmol/L (ref 135–145)
Total Bilirubin: 0.6 mg/dL (ref 0.3–1.2)
Total Protein: 5.6 g/dL — ABNORMAL LOW (ref 6.5–8.1)

## 2022-08-15 LAB — TROPONIN I (HIGH SENSITIVITY): Troponin I (High Sensitivity): 20 ng/L — ABNORMAL HIGH (ref <18)

## 2022-08-15 MED ORDER — ACETAMINOPHEN 325 MG PO TABS: 650.0000 mg | ORAL_TABLET | Freq: Four times a day (QID) | ORAL | Status: DC | PRN

## 2022-08-15 MED ORDER — LACTATED RINGERS IV BOLUS
1000.0000 mL | Freq: Once | INTRAVENOUS | Status: AC
  Administered 2022-08-15: 1000 mL via INTRAVENOUS

## 2022-08-15 MED ORDER — SODIUM CHLORIDE 0.9 % IV SOLN: INTRAVENOUS | Status: DC

## 2022-08-15 MED ORDER — HALOPERIDOL LACTATE 5 MG/ML IJ SOLN: 0.5000 mg | INTRAMUSCULAR | Status: DC | PRN

## 2022-08-15 MED ORDER — LEVETIRACETAM IN NACL 1000 MG/100ML IV SOLN
1000.0000 mg | Freq: Once | INTRAVENOUS | Status: DC
  Administered 2022-08-15: 1000 mg via INTRAVENOUS
  Filled 2022-08-15: qty 100

## 2022-08-15 MED ORDER — SODIUM CHLORIDE 0.9 % IV SOLN: 2000.0000 mg | Freq: Once | INTRAVENOUS | Status: DC

## 2022-08-15 MED ORDER — HALOPERIDOL LACTATE 2 MG/ML PO CONC: 0.5000 mg | ORAL | Status: DC | PRN

## 2022-08-15 MED ORDER — LORAZEPAM 2 MG/ML IJ SOLN: 1.0000 mg | INTRAMUSCULAR | Status: DC | PRN

## 2022-08-15 MED ORDER — MIDODRINE HCL 5 MG PO TABS
10.0000 mg | ORAL_TABLET | Freq: Three times a day (TID) | ORAL | Status: DC
  Administered 2022-08-15: 10 mg via ORAL
  Filled 2022-08-15: qty 2

## 2022-08-15 MED ORDER — MORPHINE SULFATE (PF) 2 MG/ML IV SOLN
1.0000 mg | INTRAVENOUS | Status: DC | PRN
  Administered 2022-08-15 – 2022-08-16 (×2): 1 mg via INTRAVENOUS
  Filled 2022-08-15 (×2): qty 1

## 2022-08-15 MED ORDER — LEVETIRACETAM IN NACL 1000 MG/100ML IV SOLN
1000.0000 mg | Freq: Once | INTRAVENOUS | Status: AC
  Administered 2022-08-15: 1000 mg via INTRAVENOUS
  Filled 2022-08-15: qty 100

## 2022-08-15 MED ORDER — LORAZEPAM 2 MG/ML PO CONC: 1.0000 mg | ORAL | Status: DC | PRN

## 2022-08-15 MED ORDER — LORAZEPAM 1 MG PO TABS: 1.0000 mg | ORAL_TABLET | ORAL | Status: DC | PRN

## 2022-08-15 MED ORDER — ONDANSETRON HCL 4 MG/2ML IJ SOLN
4.0000 mg | Freq: Four times a day (QID) | INTRAMUSCULAR | Status: DC | PRN
  Administered 2022-08-15 – 2022-08-16 (×2): 4 mg via INTRAVENOUS
  Filled 2022-08-15 (×2): qty 2

## 2022-08-15 MED ORDER — LORAZEPAM 2 MG/ML IJ SOLN
1.0000 mg | INTRAMUSCULAR | Status: DC | PRN
  Administered 2022-08-15: 1 mg via INTRAVENOUS
  Filled 2022-08-15: qty 1

## 2022-08-15 MED ORDER — ACETAMINOPHEN 650 MG RE SUPP: 650.0000 mg | Freq: Four times a day (QID) | RECTAL | Status: DC | PRN

## 2022-08-15 MED ORDER — ONDANSETRON 4 MG PO TBDP: 4.0000 mg | ORAL_TABLET | Freq: Four times a day (QID) | ORAL | Status: DC | PRN

## 2022-08-15 MED ORDER — DIPHENHYDRAMINE HCL 50 MG/ML IJ SOLN: 12.5000 mg | INTRAMUSCULAR | Status: DC | PRN

## 2022-08-15 MED ORDER — ALBUTEROL SULFATE (2.5 MG/3ML) 0.083% IN NEBU: 2.5000 mg | INHALATION_SOLUTION | RESPIRATORY_TRACT | Status: DC | PRN

## 2022-08-15 MED ORDER — HALOPERIDOL 0.5 MG PO TABS: 0.5000 mg | ORAL_TABLET | ORAL | Status: DC | PRN

## 2022-08-15 NOTE — ED Provider Notes (Signed)
[facility] Provider Note    Event Date/Time   First MD Initiated Contact with Patient 08/15/22 1426     (approximate)   History   Seizures (EMS called for seizure (no history of); Patient arrived with NPA and being ventilated with BVM; Unarousable to painful stimuli; Hypotensive and tachycardic upon arrival with shallow respirations)   HPI  Kayla Long is a 76 y.o. female past medical history of chronic lymphedema, hyperlipidemia, anemia who presents with altered mental status.  Patient is on hospice as of today essentially.  She has been declining at home she is bedbound for the last 3 years due to her chronic lymphedema but over the last week or so has had decreased p.o. intake has been less responsive for several days.  Family noticed shaking of her head today for about 15 seconds and she was unresponsive afterwards.  Per EMS when they arrived she still had some twitching she got some IO Versed and then became apneic required BVM.  She did not have a DNR or DNI in place.  We will to obtain history from the patient given critical illness and unresponsiveness.     Past Medical History:  Diagnosis Date   Acute on chronic anemia 10/23/2021   Chronic acquired lymphedema    Decubitus ulcer    Hx of colonic polyps    tubulovillous adenoma   Hyperlipidemia    IBS (irritable bowel syndrome)    Spinal stenosis    with secondary neuropathy    Patient Active Problem List   Diagnosis Date Noted   Pressure injury of skin 01/15/2022   Acute cystitis 01/14/2022   Dehydration 01/14/2022   Acute prerenal azotemia 01/14/2022   GERD (gastroesophageal reflux disease) 01/14/2022   Anemia of chronic disease 01/14/2022   Protein-calorie malnutrition, severe 10/26/2021   COVID-19 virus infection 35/57/3220   Acute metabolic encephalopathy 25/42/7062   Anorexia 10/23/2021   Hypomagnesemia 10/23/2021   Hypokalemia 10/23/2021   Hypotension 10/23/2021   Acute  kidney injury superimposed on CKD ([place]) 10/23/2021   CKD (chronic kidney disease), stage III ([place]) 10/23/2021   Pressure injury of skin of buttock 10/23/2021   Chronic diastolic CHF (congestive heart failure) ([place]) 10/23/2021   Depression 10/23/2021   Palliative care by specialist    Severe sepsis ([place]) 07/01/2021   Sepsis associated hypotension ([place]) 06/30/2021   History of hypertension 06/30/2021   Lymphedema of both lower extremities 06/30/2021   Morbid obesity with BMI of 50.0-59.9, adult ([place]) 06/30/2021   IBS (irritable bowel syndrome) 06/30/2021   Sepsis secondary to UTI ([place]) 06/30/2021   ANKLE PAIN, LEFT 06/17/2009   Morbid (severe) obesity due to excess calories ([place]) 04/25/2009   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 --      Excl. in [place]? --     Most recent vital signs: Vitals:   08/15/22 1520 08/15/22 1525  BP: (!) 75/54 (!) 77/53  Pulse: (!) 126 (!) 128  Resp: 18 20  SpO2: 100% 100%     General: Patient is somnolent appears ill CV:  Significant bilateral lower extremity lymphedema with chronic skin changes Resp:  Patient is tachypneic Abd:  No distention.  Neuro:             He was are equal, patient has no purposeful response no verbal response Other:     ED Results / Procedures / Treatments  Labs (all labs ordered are listed, but only  abnormal results are displayed) Labs Reviewed  COMPREHENSIVE METABOLIC PANEL - Abnormal; Notable for the following components:      Result Value   Potassium 5.4 (*)    Chloride 112 (*)    CO2 11 (*)    BUN 124 (*)    Creatinine, Ser 4.53 (*)    Calcium 7.9 (*)    Total Protein 5.6 (*)    Albumin <1.5 (*)    Alkaline Phosphatase 139 (*)    GFR, Estimated 10 (*)    All other components within normal limits  CBC WITH DIFFERENTIAL/PLATELET - Abnormal; Notable for the following components:   WBC 20.3 (*)    RBC 3.12 (*)    Hemoglobin 8.6 (*)    HCT 27.7 (*)    RDW 20.2 (*)    Platelets 138 (*)    nRBC 0.4 (*)    Neutro Abs 18.2 (*)    Abs Immature Granulocytes 0.48 (*)    All other components within normal limits  TROPONIN I (HIGH SENSITIVITY) - Abnormal; Notable for the following components:   Troponin I (High Sensitivity) 20 (*)    All other components within normal limits  TROPONIN I (HIGH SENSITIVITY)     EKG  EKG reviewed and interpreted by myself shows tach versus a flutter versus SVT heart rates in the 140s normal axi normal intervals T wave inversions throughout RADIOLOGY I reviewed and interpreted the CXR which does not show any acute cardiopulmonary process    PROCEDURES:  Critical Care performed: No  .1-3 Lead EKG Interpretation  Performed by: Rada Hay, MD Authorized by: Rada Hay, MD     Interpretation: abnormal     ECG rate assessment: tachycardic     Rhythm: atrial flutter     Ectopy: none     Conduction: normal     The patient is on the cardiac monitor to evaluate for evidence of arrhythmia and/or significant heart rate changes.   MEDICATIONS ORDERED IN ED: Medications  levETIRAcetam (KEPPRA) IVPB 1000 mg/100 mL premix (0 mg Intravenous Stopped 08/15/22 1505)  lactated ringers bolus 1,000 mL (1,000 mLs Intravenous New Bag/Given 08/15/22 1500)  lactated ringers bolus 1,000 mL (1,000 mLs Intravenous New Bag/Given 08/15/22 1500)      IMPRESSION / MDM / ASSESSMENT AND PLAN / ED COURSE  I reviewed the triage vital signs and the nursing notes.                              Patient's presentation is most consistent with acute presentation with potential threat to life or bodily  function.  Differential diagnosis includes, but is not limited to, electrolyte abnormality, renal failure, intracranial hemorrhage, hypoglycemia, hypoxia, dehydration, hypovolemia  Is a 76 year old female who is chronically ill bedbound on hospice who presents with seizure activity.  Arrival to ED patient is minimally responsive initially being bagged by EMS but she does have spontaneous respiratory effort.  Placed on nonrebreather with sats in the high 90s.  She is tachypneic.  She is minimally responsive pupils are equal.  She is hypotensive tachycardic EKG showing questionable flutter versus sinus tach.  Patient's family at bedside initially unsure about goals of care however after long discussion about her critical illness and poor prognosis they did agree that she will be DNR/DNI and comfort care is the goal.  We discussed work-up including labs head CT.  They ultimately did not get blood work but did not want to get a CAT scan of her brain.  They did want to check labs but did not want to do anything invasive as far as treatment.  I did order Keppra prior to this discussion.  Plan to give fluids.  I have discussed with patient's hospice [place] care.  They will reach out to their inpatient hospice to see if patient can be placed today.  If not we will need to be discussion of whether patient can go home for hospice or be admitted for comfort care.  Signed out to the oncoming provider.       FINAL CLINICAL IMPRESSION(S) / ED DIAGNOSES   Final diagnoses:  Seizure ([place])  Acute renal failure, unspecified acute renal failure type ([place])  Altered mental status, unspecified altered mental status type     Rx / DC Orders   ED Discharge Orders      None        Note:  This document was prepared using Dragon voice recognition software and may include unintentional dictation errors.   Rada Hay, MD 08/15/22 [phone]

## 2022-08-15 NOTE — ED Triage Notes (Signed)
EMS called for seizure (no history of); Patient arrived with NPA and being ventilated with BVM; Unarousable to painful stimuli; Hypotensive and tachycardic upon arrival with shallow respirations

## 2022-08-15 NOTE — Progress Notes (Signed)
[facility] ED [facility])   Hospice hospital liaison note     This patient is a current hospice patient with [facility], admitted 9.25.23 with a terminal diagnosis of chronic diastolic heart failure.  Liaison has communicated with patient's daughters and answered questions for transfer to [place] vs back home.  Daughter's wish to monitor tonight and then make decision tomorrow morning.   [facility] will continue to follow for any discharge planning needs and to coordinate continuation of hospice care.   Please don't hesitate to call with any Hospice related questions or concerns.    Thank you for the opportunity to participate in this patient's care. Jhonnie Garner, Therapist, sports, BSN, [facility] Liaison [phone]

## 2022-08-15 NOTE — H&P (Signed)
History and Physical    Kayla Long NWG:[id number] DOB: 1946-08-18 DOA: 08/15/2022  PCP: Erline Levine, MD  Patient coming from: home  I have personally briefly reviewed patient's old medical records in [place]  Chief Complaint:   HPI: Kayla Long is a 76 y.o. female with medical history significant of  Chronic diastolic heart failure chronic lymphedema, hyperlipidemia, anemia ,bed bound state x 3 years, interim history or recent admission to hospice 9.25.23, who presents to ED with change in mental status after witnessed seizure like activity at home. IN the field patient  was given IO versed and then became apneic requiring BMV ventilation. ON arrival to ED patient had NPA in place and was being ventilated with BVM . Per notes patient was and noted to unarouseable to painful stimuli.  Patient was also noted to be hypotensive and tachycardic.  Patient during ED course was made DNR/DNI with family opting for comfort care. Hospice was contacted and per hospice daughters requested overnight admission and with possible transfer to hospice in am. Currently in ED patient remains hypotensive, is more alert but intermittently moans, not able to give any history. Currently goals of care discussed with daughter's who note their agreement for DNI/DNR and comfort care only.  ED Course:   As noted above  Review of Systems: Unable to obtain Past Medical History:  Diagnosis Date   Acute on chronic anemia 10/23/2021   Chronic acquired lymphedema    Decubitus ulcer    Hx of colonic polyps    tubulovillous adenoma   Hyperlipidemia    IBS (irritable bowel syndrome)    Spinal stenosis    with secondary neuropathy    Past Surgical History:  Procedure Laterality Date   PARTIAL HYSTERECTOMY     vaginal deliveries     x2   VESICOVAGINAL FISTULA CLOSURE W/ TAH       reports that she has quit smoking. Her smoking use included cigarettes. She has never used smokeless tobacco. She  reports current alcohol use. She reports that she does not use drugs.  Allergies  Allergen Reactions   Fluoxetine Hcl     REACTION: headache    Family History  Problem Relation Age of Onset   Stroke Mother    Stroke Father    Heart disease Brother     Prior to Admission medications   Medication Sig Start Date End Date Taking? Authorizing Provider  albuterol (VENTOLIN HFA) 108 (90 Base) MCG/ACT inhaler Inhale 2 puffs into the lungs every 6 (six) hours as needed for wheezing or shortness of breath. 11/06/21  Yes Sreenath, Sudheer B, MD  FEROSUL 325 (65 Fe) MG tablet Take 325 mg by mouth every other day.   Yes [provider]  folic acid (FOLVITE) 1 MG tablet Take 1 tablet (1 mg total) by mouth daily. 01/16/22  Yes Lorella Nimrod, MD  furosemide (LASIX) 40 MG tablet Take 2 tablets (80 mg total) by mouth daily. Hold for the next couple of days and then resume it 01/15/22  Yes Lorella Nimrod, MD  levothyroxine (SYNTHROID) 25 MCG tablet Take 25 mcg by mouth daily before breakfast.   Yes [provider]  midodrine (PROAMATINE) 10 MG tablet Take 1 tablet (10 mg total) by mouth 3 (three) times daily as needed (for SBP <100). 11/06/21  Yes Sreenath, Sudheer B, MD  mirtazapine (REMERON SOL-TAB) 15 MG disintegrating tablet Take 7.5 mg by mouth at bedtime.   Yes [provider]  Oxycodone HCl 10  MG TABS Take 10 mg by mouth 3 (three) times daily as needed (severe pain).   Yes [provider]  pantoprazole (PROTONIX) 40 MG tablet Take 1 tablet (40 mg total) by mouth daily before breakfast. 07/11/21  Yes Amin, Ankit Chirag, MD  PARoxetine (PAXIL) 30 MG tablet Take 30 mg by mouth daily.   Yes [provider]  potassium chloride SA (KLOR-CON M) 20 MEQ tablet Take 20 mEq by mouth daily.   Yes [provider]  senna-docusate (SENOKOT-S) 8.6-50 MG tablet Take 1 tablet by mouth at bedtime as needed for moderate constipation. 07/11/21  Yes Amin, Ankit Chirag, MD   SSD 1 % cream Apply 1 application topically daily. (Apply to cracks and affected areas of skin)   Yes [provider]  Multiple Vitamins-Iron (MULTIVITAMINS WITH IRON) TABS tablet Take 1 tablet by mouth daily. 01/16/22   Lorella Nimrod, MD    Physical Exam: Vitals:   08/15/22 1700 08/15/22 1710 08/15/22 1720 08/15/22 1730  BP: (!) 76/52 (!) 77/50 (!) 84/54 (!) 86/53  Pulse: (!) 120 (!) 120 (!) 123 (!) 124  Resp: '17 20 18 20  '$ Temp:      TempSrc:      SpO2: 100% 100% 100% 100%  Weight:      Height:         Vitals:   08/15/22 1700 08/15/22 1710 08/15/22 1720 08/15/22 1730  BP: (!) 76/52 (!) 77/50 (!) 84/54 (!) 86/53  Pulse: (!) 120 (!) 120 (!) 123 (!) 124  Resp: '17 20 18 20  '$ Temp:      TempSrc:      SpO2: 100% 100% 100% 100%  Weight:      Height:      Constitutional: lethargic responsive to painful stimuli Respiratory: clear to auscultation bilaterally, no wheezing. Mild increase respirations  Cardiovascular: tachycardic/ rubs / gallops. Abdomen: no tenderness, no masses palpated. No hepatosplenomegaly. Bowel sounds positive.  Skin: chronic lymphedema changes on lower extremity Neurologic: lethargic, cn grossly intact  Psychiatric: unable to assess   Labs on Admission: I have personally reviewed following labs and imaging studies  CBC: Recent Labs  Lab 08/15/22 1505  WBC 20.3*  NEUTROABS 18.2*  HGB 8.6*  HCT 27.7*  MCV 88.8  PLT 761*   Basic Metabolic Panel: Recent Labs  Lab 08/15/22 1505  NA 137  K 5.4*  CL 112*  CO2 11*  GLUCOSE 76  BUN 124*  CREATININE 4.53*  CALCIUM 7.9*   GFR: Estimated Creatinine Clearance: 13.7 mL/min (A) (by C-G formula based on SCr of 4.53 mg/dL (H)). Liver Function Tests: Recent Labs  Lab 08/15/22 1505  AST 18  ALT 6  ALKPHOS 139*  BILITOT 0.6  PROT 5.6*  ALBUMIN <1.5*   No results for input(s): "LIPASE", "AMYLASE" in the last 168 hours. No results for input(s): "AMMONIA" in the last 168 hours. Coagulation  Profile: No results for input(s): "INR", "PROTIME" in the last 168 hours. Cardiac Enzymes: No results for input(s): "CKTOTAL", "CKMB", "CKMBINDEX", "TROPONINI" in the last 168 hours. BNP (last 3 results) No results for input(s): "PROBNP" in the last 8760 hours. HbA1C: No results for input(s): "HGBA1C" in the last 72 hours. CBG: No results for input(s): "GLUCAP" in the last 168 hours. Lipid Profile: No results for input(s): "CHOL", "HDL", "LDLCALC", "TRIG", "CHOLHDL", "LDLDIRECT" in the last 72 hours. Thyroid Function Tests: No results for input(s): "TSH", "T4TOTAL", "FREET4", "T3FREE", "THYROIDAB" in the last 72 hours. Anemia Panel: No results for input(s): "VITAMINB12", "FOLATE", "  FERRITIN", "TIBC", "IRON", "RETICCTPCT" in the last 72 hours. Urine analysis:    Component Value Date/Time   COLORURINE YELLOW (A) 01/13/2022 2022   APPEARANCEUR TURBID (A) 01/13/2022 2022   LABSPEC 1.008 01/13/2022 2022   PHURINE 5.0 01/13/2022 2022   GLUCOSEU NEGATIVE 01/13/2022 2022   HGBUR MODERATE (A) 01/13/2022 2022   BILIRUBINUR NEGATIVE 01/13/2022 2022   KETONESUR NEGATIVE 01/13/2022 2022   PROTEINUR NEGATIVE 01/13/2022 2022   NITRITE NEGATIVE 01/13/2022 2022   LEUKOCYTESUR LARGE (A) 01/13/2022 2022    Radiological Exams on Admission: DG Chest Portable 1 View  Result Date: 08/15/2022 CLINICAL DATA:  Shortness of breath EXAM: PORTABLE CHEST 1 VIEW COMPARISON:  04/07/2022 FINDINGS: The heart size and mediastinal contours are within normal limits. Both lungs are clear. The visualized skeletal structures are unremarkable. IMPRESSION: No acute abnormality of the lungs in AP portable projection. Electronically Signed   By: Delanna Ahmadi M.D.   On: 08/15/2022 15:06    EKG: Independently reviewed.   Assessment/Plan  A Sepsis with multiorgan dysfunction  Hypotension  Acute hypoxic respiratory failure  Acute renal failure  Abnormal cardiac enzymes  Probable SZ  Hyperkalemia  P Patient's  current clinical status in setting of comorbidities and recent decline discussed in [place] with family. Family again reiterate there wishes for  DNI/DNR and comfort measures only  Comfort measures have been placed Hospice as been consulted and will follow  Possible plan to transition to inpatient hospice vs home with hospice in am   DVT prophylaxis: n/a Code Status: DNR/DNI Family Communication: Kayla Long, Kayla Long (Daughter)  [phone] (Home Phone Disposition Plan: patient  expected to be admitted greater than 2 midnights  Consults called: hospice Admission status: med surg   Clance Boll MD [facility]  If 7PM-7AM, please contact night-coverage [email or web address] Password [facility]  08/15/2022, 8:36 PM

## 2022-08-16 ENCOUNTER — Encounter: Payer: Self-pay | Admitting: Internal Medicine

## 2022-08-16 ENCOUNTER — Other Ambulatory Visit: Payer: Self-pay

## 2022-08-16 DIAGNOSIS — A419 Sepsis, unspecified organism: Secondary | ICD-10-CM | POA: Diagnosis not present

## 2022-08-16 DIAGNOSIS — R569 Unspecified convulsions: Secondary | ICD-10-CM

## 2022-08-16 DIAGNOSIS — I890 Lymphedema, not elsewhere classified: Secondary | ICD-10-CM

## 2022-08-16 DIAGNOSIS — L89300 Pressure ulcer of unspecified buttock, unstageable: Secondary | ICD-10-CM

## 2022-08-16 DIAGNOSIS — Z515 Encounter for palliative care: Secondary | ICD-10-CM | POA: Diagnosis not present

## 2022-08-16 DIAGNOSIS — R6520 Severe sepsis without septic shock: Secondary | ICD-10-CM

## 2022-08-16 DIAGNOSIS — I9589 Other hypotension: Secondary | ICD-10-CM | POA: Diagnosis not present

## 2022-08-16 DIAGNOSIS — E875 Hyperkalemia: Secondary | ICD-10-CM

## 2022-08-16 DIAGNOSIS — N179 Acute kidney failure, unspecified: Secondary | ICD-10-CM

## 2022-08-16 DIAGNOSIS — J9601 Acute respiratory failure with hypoxia: Secondary | ICD-10-CM

## 2022-08-16 DIAGNOSIS — E43 Unspecified severe protein-calorie malnutrition: Secondary | ICD-10-CM

## 2022-08-16 DIAGNOSIS — N189 Chronic kidney disease, unspecified: Secondary | ICD-10-CM

## 2022-08-16 MED ORDER — HALOPERIDOL LACTATE 5 MG/ML IJ SOLN: 0.5000 mg | INTRAMUSCULAR | Status: AC | PRN

## 2022-08-16 MED ORDER — HYDROMORPHONE HCL 1 MG/ML IJ SOLN
0.5000 mg | INTRAMUSCULAR | Status: DC | PRN
  Administered 2022-08-16: 0.5 mg via INTRAVENOUS
  Filled 2022-08-16: qty 1

## 2022-08-16 MED ORDER — LORAZEPAM 2 MG/ML IJ SOLN: 1.0000 mg | INTRAMUSCULAR | 0 refills | Status: AC | PRN

## 2022-08-16 MED ORDER — HYDROMORPHONE HCL 1 MG/ML IJ SOLN: 0.5000 mg | INTRAMUSCULAR | 0 refills | Status: AC | PRN

## 2022-08-16 MED ORDER — ONDANSETRON HCL 4 MG/2ML IJ SOLN: 4.0000 mg | Freq: Four times a day (QID) | INTRAMUSCULAR | 0 refills | Status: AC | PRN

## 2022-08-16 MED ORDER — ACETAMINOPHEN 650 MG RE SUPP: 650.0000 mg | Freq: Four times a day (QID) | RECTAL | 0 refills | Status: AC | PRN

## 2022-08-16 MED ORDER — DIPHENHYDRAMINE HCL 50 MG/ML IJ SOLN: 12.5000 mg | INTRAMUSCULAR | 0 refills | Status: AC | PRN

## 2022-08-16 NOTE — Assessment & Plan Note (Signed)
Present on admission, see full description below. 

## 2022-08-16 NOTE — Assessment & Plan Note (Signed)
I do not think she has an accurate weight in the computer.

## 2022-08-16 NOTE — Assessment & Plan Note (Signed)
Acute kidney injury on CKD stage IIIa.  Creatinine 4.53 on presentation

## 2022-08-16 NOTE — Progress Notes (Signed)
Manufacturing engineer [facility])  Ms. Barkalow is approved to go to [facility] for EOL care, family is in agreement.   RN staff, please call report to [phone] at any time.   Will need DC summary completed.  Please send completed DNR.  Family requested transport ~ 4 pm, [facility] will arrange with [facility].  If [name] is not at bedside when EMS arrives, please call her to make her aware that transport is occurring.  Thank you, Venia Carbon DNP, RN [facility] Liaison

## 2022-08-16 NOTE — Progress Notes (Signed)
Nutrition Brief Note  Chart reviewed. Pt now transitioning to comfort care.  No further nutrition interventions planned at this time.  Please re-consult as needed.   Kemarion Abbey W, RD, LDN, CDCES Registered Dietitian II Certified Diabetes Care and Education Specialist Please refer to AMION for RD and/or RD on-call/weekend/after hours pager   

## 2022-08-16 NOTE — Assessment & Plan Note (Signed)
Patient made comfort care measures on admission.  Evaluated by hospice liaison on 08/16/2022 and patient will be discharged to the hospice home today.  Comfort care measures ordered.

## 2022-08-16 NOTE — Assessment & Plan Note (Signed)
Looks like patient has chronic hypotension at home taking midodrine.  Unable to take midodrine at this time.

## 2022-08-16 NOTE — Assessment & Plan Note (Addendum)
Patient made end-of-life care.  No further treatment. Patient had multiorgan dysfunction.  With acute kidney injury, and thrombocytopenia.

## 2022-08-16 NOTE — Assessment & Plan Note (Signed)
Ativan as needed 

## 2022-08-16 NOTE — Plan of Care (Signed)
  Problem: Education: Goal: Knowledge of the prescribed therapeutic regimen will improve Outcome: Adequate for Discharge   Problem: Coping: Goal: Ability to identify and develop effective coping behavior will improve Outcome: Adequate for Discharge   Problem: Clinical Measurements: Goal: Quality of life will improve Outcome: Adequate for Discharge   Problem: Respiratory: Goal: Verbalizations of increased ease of respirations will increase Outcome: Adequate for Discharge   Problem: Role Relationship: Goal: Family's ability to cope with current situation will improve Outcome: Adequate for Discharge Goal: Ability to verbalize concerns, feelings, and thoughts to partner or family member will improve Outcome: Adequate for Discharge   Problem: Pain Management: Goal: Satisfaction with pain management regimen will improve Outcome: Adequate for Discharge   Problem: Education: Goal: Knowledge of General Education information will improve Description: Including pain rating scale, medication(s)/side effects and non-pharmacologic comfort measures Outcome: Adequate for Discharge   Problem: Health Behavior/Discharge Planning: Goal: Ability to manage health-related needs will improve Outcome: Adequate for Discharge   Problem: Clinical Measurements: Goal: Ability to maintain clinical measurements within normal limits will improve Outcome: Adequate for Discharge Goal: Will remain free from infection Outcome: Adequate for Discharge Goal: Diagnostic test results will improve Outcome: Adequate for Discharge Goal: Respiratory complications will improve Outcome: Adequate for Discharge Goal: Cardiovascular complication will be avoided Outcome: Adequate for Discharge   Problem: Activity: Goal: Risk for activity intolerance will decrease Outcome: Adequate for Discharge   Problem: Nutrition: Goal: Adequate nutrition will be maintained Outcome: Adequate for Discharge   Problem:  Coping: Goal: Level of anxiety will decrease Outcome: Adequate for Discharge   Problem: Elimination: Goal: Will not experience complications related to bowel motility Outcome: Adequate for Discharge Goal: Will not experience complications related to urinary retention Outcome: Adequate for Discharge   Problem: Pain Managment: Goal: General experience of comfort will improve Outcome: Adequate for Discharge   Problem: Safety: Goal: Ability to remain free from injury will improve Outcome: Adequate for Discharge   Problem: Skin Integrity: Goal: Risk for impaired skin integrity will decrease Outcome: Adequate for Discharge   Patient discharging to Hospice

## 2022-08-16 NOTE — Assessment & Plan Note (Signed)
Chronic lymphedema

## 2022-08-16 NOTE — Assessment & Plan Note (Signed)
Likely from acute kidney injury.  No treatment since comfort care.

## 2022-08-16 NOTE — Progress Notes (Signed)
   08/16/22 1500  Clinical Encounter Type  Visited With Patient and family together  Visit Type Initial  Referral From Nurse  Consult/Referral To Chaplain   Chaplain responded to consult. Chaplain provided compassionate presence and reflective listening as daughter spoke about patient's situation. Chaplain provided comfort to family and friends. Chaplain is available for follow up as needed.

## 2022-08-16 NOTE — ED Notes (Addendum)
This RN and Merrily Pew, RN at bedside. Pt minimally responsive. PO medication ordered. MD messaged in regard to concerns of administering PO medication.

## 2022-08-16 NOTE — Progress Notes (Signed)
Report given to stephanie hospice RN

## 2022-08-16 NOTE — Discharge Summary (Signed)
Physician Discharge Summary   Patient: Kayla Long MRN: [id number] DOB: [birth year]  Admit date:     08/15/2022  Discharge date: 08/16/22  Discharge Physician: Loletha Grayer   PCP: Erline Levine, MD   Recommendations at discharge:   Follow-up at the hospice home 1 day  Discharge Diagnoses: Principal Problem:   End of life care Active Problems:   Severe sepsis (HCC)   Hypotension   Acute kidney injury superimposed on CKD (HCC)   Pressure injury of skin   Morbid (severe) obesity due to excess calories (HCC)   Lymphedema of both lower extremities   Protein-calorie malnutrition, severe   Acute respiratory failure with hypoxia (HCC)   Hyperkalemia   Seizure [facility])    Hospital Course: Kayla Long is a 76 y.o. female with medical history significant of  Chronic diastolic heart failure chronic lymphedema, hyperlipidemia, anemia ,bed bound state x 3 years, interim history or recent admission to hospice 9.25.23, who presents to ED with change in mental status after witnessed seizure like activity at home. IN the field patient  was given IO versed and then became apneic requiring BMV ventilation. ON arrival to ED patient had NPA in place and was being ventilated with BVM . Per notes patient was and noted to unarouseable to painful stimuli.  Patient was also noted to be hypotensive and tachycardic.  Patient during ED course was made DNR/DNI with family opting for comfort care. Hospice was contacted and per hospice daughters requested overnight admission and with possible transfer to hospice in am. Currently in ED patient remains hypotensive, is more alert but intermittently moans, not able to give any history. Currently goals of care discussed with daughter's who note their agreement for DNI/DNR and comfort care only.   Patient was evaluated by the hospice liaison and accepted to the hospice home.  Patient will be discharged to the hospice home today.  Assessment and Plan: *  End of life care Patient made comfort care measures on admission.  Evaluated by hospice liaison on 08/16/2022 and patient will be discharged to the hospice home today.  Comfort care measures ordered.  Severe sepsis Kayla Long) Patient made end-of-life care.  No further treatment. Patient had multiorgan dysfunction.  With acute kidney injury, and thrombocytopenia.  Hypotension Looks like patient has chronic hypotension at home taking midodrine.  Unable to take midodrine at this time.  Acute kidney injury superimposed on CKD (Bud) Acute kidney injury on CKD stage IIIa.  Creatinine 4.53 on presentation  Pressure injury of skin Present on admission, see full description below  Seizure (Kayla Long) Ativan as needed  Hyperkalemia Likely from acute kidney injury.  No treatment since comfort care.  Protein-calorie malnutrition, severe Currently on end-of-life care  Lymphedema of both lower extremities Chronic lymphedema  Morbid (severe) obesity due to excess calories (Kayla Long) I do not think she has an accurate weight in the computer.         Consultants: Hospice liaison Procedures performed: None Disposition: Discharge to hospice home Diet recommendation:  N.p.o. DISCHARGE MEDICATION: Allergies as of 08/16/2022       Reactions   Fluoxetine Hcl    REACTION: headache        Medication List     STOP taking these medications    albuterol 108 (90 Base) MCG/ACT inhaler Commonly known as: VENTOLIN HFA   FeroSul 325 (65 FE) MG tablet Generic drug: ferrous sulfate   folic acid 1 MG tablet Commonly known as: FOLVITE   furosemide 40 MG tablet  Commonly known as: LASIX   levothyroxine 25 MCG tablet Commonly known as: SYNTHROID   midodrine 10 MG tablet Commonly known as: PROAMATINE   mirtazapine 15 MG disintegrating tablet Commonly known as: REMERON SOL-TAB   multivitamins with iron Tabs tablet   Oxycodone HCl 10 MG Tabs   pantoprazole 40 MG tablet Commonly known as:  PROTONIX   PARoxetine 30 MG tablet Commonly known as: PAXIL   potassium chloride SA 20 MEQ tablet Commonly known as: KLOR-CON M   senna-docusate 8.6-50 MG tablet Commonly known as: Senokot-S   SSD 1 % cream Generic drug: silver sulfADIAZINE       TAKE these medications    acetaminophen 650 MG suppository Commonly known as: TYLENOL Place 1 suppository (650 mg total) rectally every 6 (six) hours as needed for mild pain (or Fever >/= 101).   diphenhydrAMINE 50 MG/ML injection Commonly known as: BENADRYL Inject 0.25 mLs (12.5 mg total) into the vein every 4 (four) hours as needed for itching.   haloperidol lactate 5 MG/ML injection Commonly known as: HALDOL Inject 0.1 mLs (0.5 mg total) into the vein every 4 (four) hours as needed (or delirium).   HYDROmorphone 1 MG/ML injection Commonly known as: DILAUDID Inject 0.5 mLs (0.5 mg total) into the vein every 3 (three) hours as needed for severe pain or moderate pain.   LORazepam 2 MG/ML injection Commonly known as: ATIVAN Inject 0.5 mLs (1 mg total) into the vein every 4 (four) hours as needed for anxiety.   ondansetron 4 MG/2ML Soln injection Commonly known as: ZOFRAN Inject 2 mLs (4 mg total) into the vein every 6 (six) hours as needed for nausea.        Discharge Exam: Filed Weights   08/15/22 1539  Weight: 109.6 kg   Physical Exam HENT:     Head: Normocephalic.  Eyes:     General: Lids are normal.     Conjunctiva/sclera: Conjunctivae normal.  Cardiovascular:     Rate and Rhythm: Normal rate and regular rhythm.     Heart sounds: Normal heart sounds, S1 normal and S2 normal.  Pulmonary:     Breath sounds: Examination of the right-lower field reveals decreased breath sounds. Examination of the left-lower field reveals decreased breath sounds. Decreased breath sounds present. No wheezing, rhonchi or rales.  Abdominal:     Palpations: Abdomen is soft.     Tenderness: There is no abdominal tenderness.   Musculoskeletal:     Right lower leg: Swelling present.     Left lower leg: Swelling present.  Skin:    General: Skin is warm.     Comments: Chronic lower extremity discoloration and scaling.  Neurological:     Mental Status: She is lethargic.      Condition at discharge: Guarded  The results of significant diagnostics from this hospitalization (including imaging, microbiology, ancillary and laboratory) are listed below for reference.   Imaging Studies: DG Chest Portable 1 View  Result Date: 08/15/2022 CLINICAL DATA:  Shortness of breath EXAM: PORTABLE CHEST 1 VIEW COMPARISON:  04/07/2022 FINDINGS: The heart size and mediastinal contours are within normal limits. Both lungs are clear. The visualized skeletal structures are unremarkable. IMPRESSION: No acute abnormality of the lungs in AP portable projection. Electronically Signed   By: Delanna Ahmadi M.D.   On: 08/15/2022 15:06    Microbiology: Results for orders placed or performed during the hospital encounter of 01/13/22  Resp Panel by RT-PCR (Flu A&B, Covid) Nasopharyngeal Swab     Status:  None   Collection Time: 01/13/22  8:22 PM   Specimen: Nasopharyngeal Swab; Nasopharyngeal(NP) swabs in vial transport medium  Result Value Ref Range Status   SARS Coronavirus 2 by RT PCR NEGATIVE NEGATIVE Final    Comment: (NOTE) SARS-CoV-2 target nucleic acids are NOT DETECTED.  The SARS-CoV-2 RNA is generally detectable in upper respiratory specimens during the acute phase of infection. The lowest concentration of SARS-CoV-2 viral copies this assay can detect is 138 copies/mL. A negative result does not preclude SARS-Cov-2 infection and should not be used as the sole basis for treatment or other patient management decisions. A negative result may occur with  improper specimen collection/handling, submission of specimen other than nasopharyngeal swab, presence of viral mutation(s) within the areas targeted by this assay, and inadequate  number of viral copies(<138 copies/mL). A negative result must be combined with clinical observations, patient history, and epidemiological information. The expected result is Negative.  Fact Sheet for Patients:  EntrepreneurPulse.com.au  Fact Sheet for Healthcare Providers:  IncredibleEmployment.be  This test is no t yet approved or cleared by the [place] FDA and  has been authorized for detection and/or diagnosis of SARS-CoV-2 by FDA under an Emergency Use Authorization (EUA). This EUA will remain  in effect (meaning this test can be used) for the duration of the COVID-19 declaration under Section 564(b)(1) of the Act, 21 U.S.C.section 360bbb-3(b)(1), unless the authorization is terminated  or revoked sooner.       Influenza A by PCR NEGATIVE NEGATIVE Final   Influenza B by PCR NEGATIVE NEGATIVE Final    Comment: (NOTE) The Xpert Xpress SARS-CoV-2/FLU/RSV plus assay is intended as an aid in the diagnosis of influenza from Nasopharyngeal swab specimens and should not be used as a sole basis for treatment. Nasal washings and aspirates are unacceptable for Xpert Xpress SARS-CoV-2/FLU/RSV testing.  Fact Sheet for Patients: EntrepreneurPulse.com.au  Fact Sheet for Healthcare Providers: IncredibleEmployment.be  This test is not yet approved or cleared by the [place] FDA and has been authorized for detection and/or diagnosis of SARS-CoV-2 by FDA under an Emergency Use Authorization (EUA). This EUA will remain in effect (meaning this test can be used) for the duration of the COVID-19 declaration under Section 564(b)(1) of the Act, 21 U.S.C. section 360bbb-3(b)(1), unless the authorization is terminated or revoked.  Performed at [facility], [address], [place], [place] [address]   Urine Culture     Status: None   Collection Time: 01/13/22  8:22 PM   Specimen: In/Out Cath Urine   Result Value Ref Range Status   Specimen Description   Final    IN/OUT CATH URINE Performed at [facility], [address], [place], [place] [address]    Special Requests   Final    NONE Performed at [facility], [address] [place], [place] [address]    Culture   Final    NO GROWTH Performed at [facility], [place] [address], [place], [place] [address]    Report Status 01/16/2022 FINAL  Final  Culture, blood (routine x 2)     Status: None   Collection Time: 01/13/22  9:34 PM   Specimen: BLOOD  Result Value Ref Range Status   Specimen Description BLOOD BLOOD RIGHT HAND  Final   Special Requests   Final    BOTTLES DRAWN AEROBIC AND ANAEROBIC Blood Culture adequate volume   Culture   Final    NO GROWTH 5 DAYS Performed at [facility],  [place], [place] [address]    Report Status 01/18/2022 FINAL  Final  Culture, blood (routine x 2)     Status: None   Collection Time: 01/13/22  9:34 PM   Specimen: BLOOD  Result Value Ref Range Status   Specimen Description BLOOD BLOOD LEFT WRIST  Final   Special Requests   Final    BOTTLES DRAWN AEROBIC AND ANAEROBIC Blood Culture adequate volume   Culture   Final    NO GROWTH 5 DAYS Performed at [facility], [address] [place], [place] [address]    Report Status 01/18/2022 FINAL  Final    Labs: CBC: Recent Labs  Lab 08/15/22 1505  WBC 20.3*  NEUTROABS 18.2*  HGB 8.6*  HCT 27.7*  MCV 88.8  PLT 732*   Basic Metabolic Panel: Recent Labs  Lab 08/15/22 1505  NA 137  K 5.4*  CL 112*  CO2 11*  GLUCOSE 76  BUN 124*  CREATININE 4.53*  CALCIUM 7.9*   Liver Function Tests: Recent Labs  Lab 08/15/22 1505  AST 18  ALT 6  ALKPHOS 139*  BILITOT 0.6  PROT 5.6*  ALBUMIN <1.5*    Discharge time spent: greater than 30 minutes.  Signed: Loletha Grayer, MD [facility] 08/16/2022

## 2022-08-16 NOTE — Assessment & Plan Note (Signed)
Currently on end-of-life care

## 2022-08-18 IMAGING — DX DG CHEST 1V PORT
1 series · 1 of 1 positions shown · non-contrast
Comparison: Radiograph 11/03/2021

CLINICAL DATA: Tachycardia.

EXAM:
PORTABLE CHEST 1 VIEW

[chest ap]
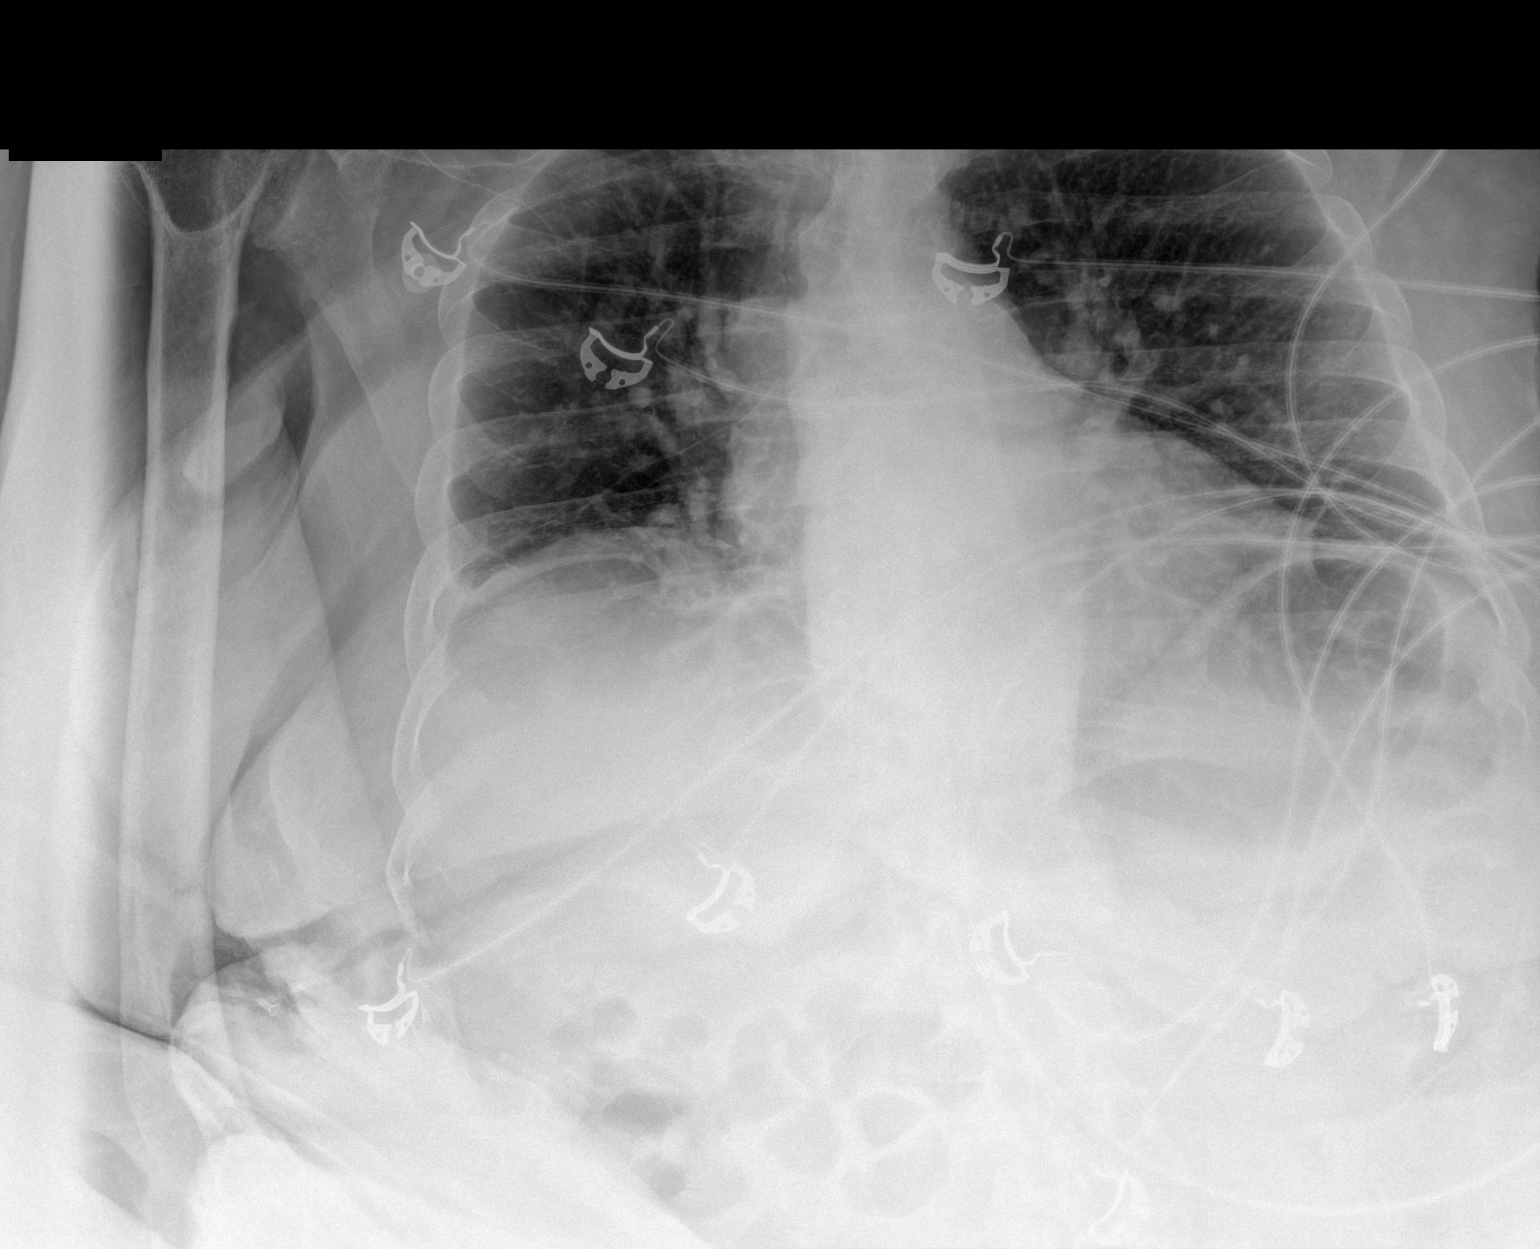

[1 of 1 positions shown; findings below may reference images not displayed]

FINDINGS: Low lung volumes persist. Upper normal heart size with stable
mediastinal contours. There is a right pleural effusion with
associated basilar opacity. Minor left lung base atelectasis. No
pneumothorax. No acute osseous findings. Chronic right shoulder
degenerative change.
IMPRESSION: 1. Right pleural effusion with associated basilar opacity.
2. Low lung volumes.

## 2022-08-18 IMAGING — CT CT HEAD W/O CM
4 series · 17 of 47 positions shown, 19 images · non-contrast
Comparison: 10/23/2021

CLINICAL DATA: Mental status change, unknown cause



[Series 2: head wo · axial · 0.44mm/px · z∈[-172,-52]mm · 7 of 33 slices shown, 9 images]
[im 5/33  brain]
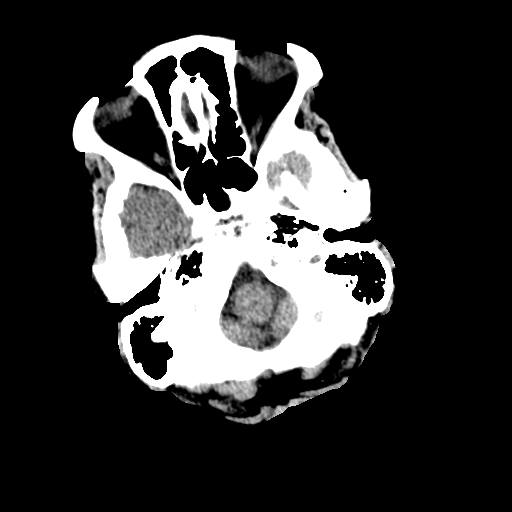
[im 5/33  bone]
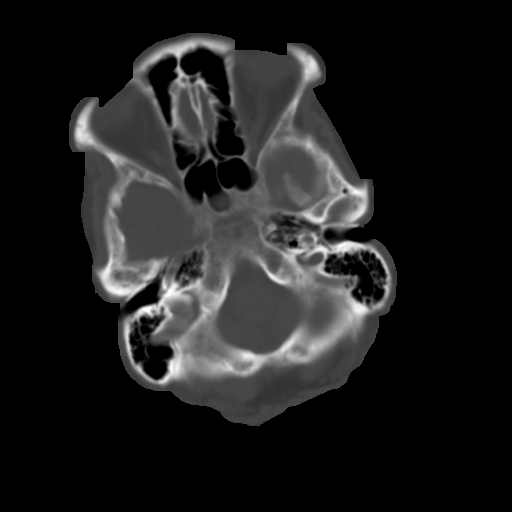
[im 9/33  brain]
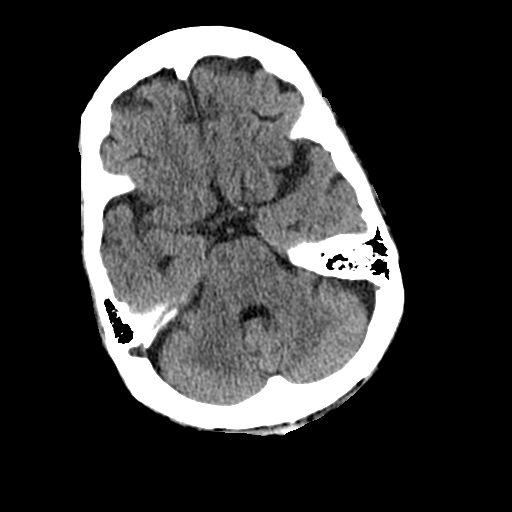
[im 13/33  brain]
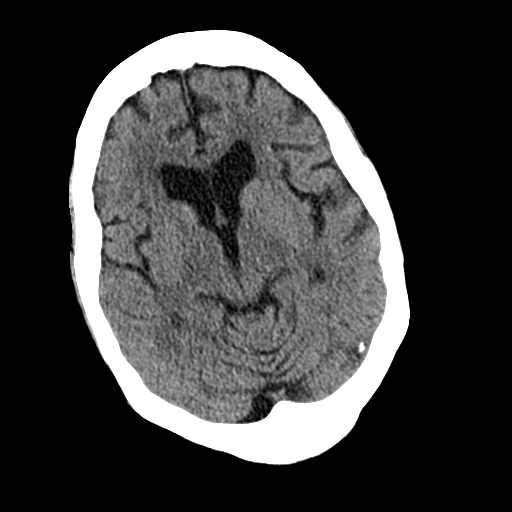
[im 17/33  brain]
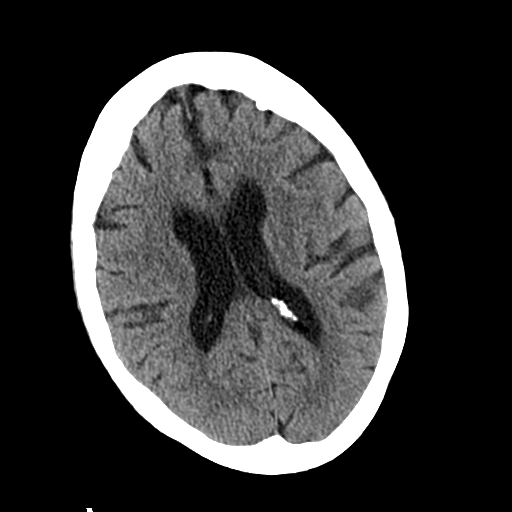
[im 21/33  brain]
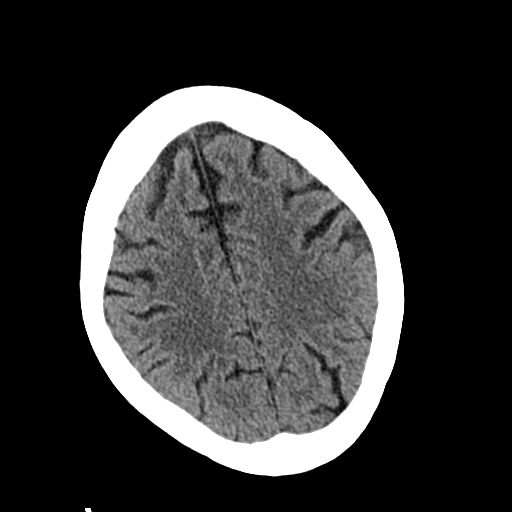
[im 21/33  bone]
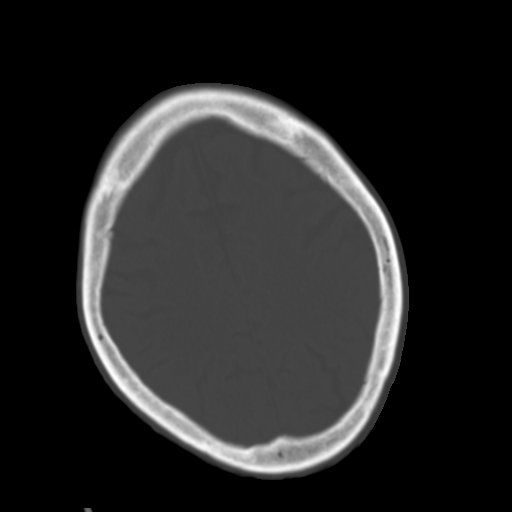
[im 25/33  brain]
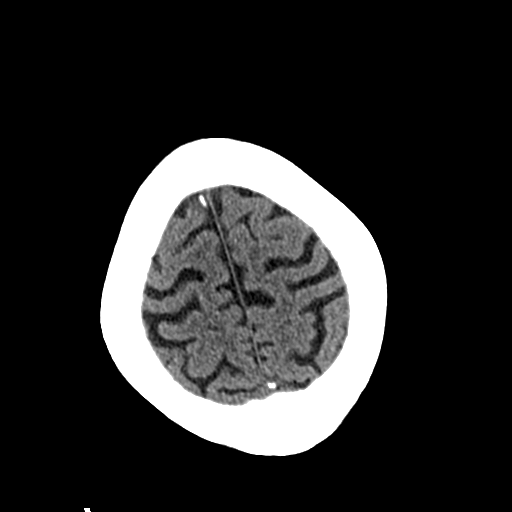
[im 29/33  brain]
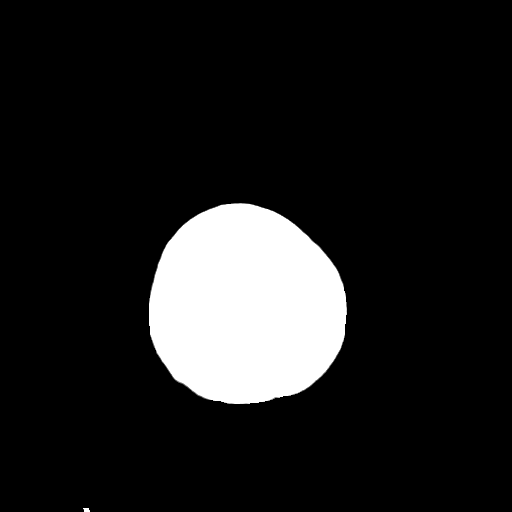

[Series 3: head bone · axial · 0.44mm/px · z∈[-176,-120]mm · 4 of 83 slices shown]
[im 9/83  bone]
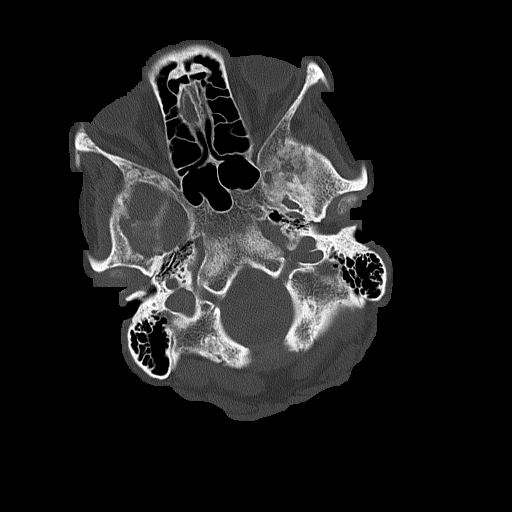
[im 17/83  bone]
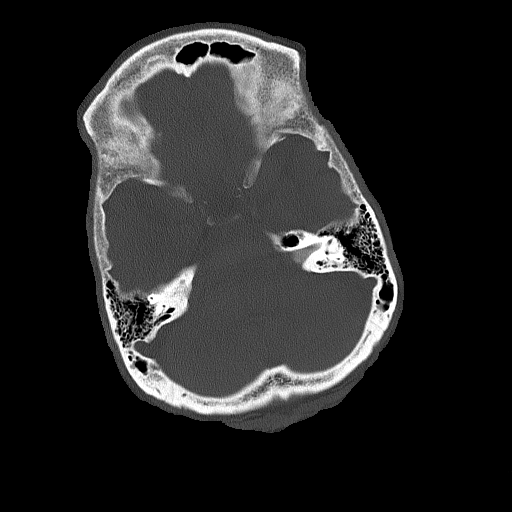
[im 25/83  bone]
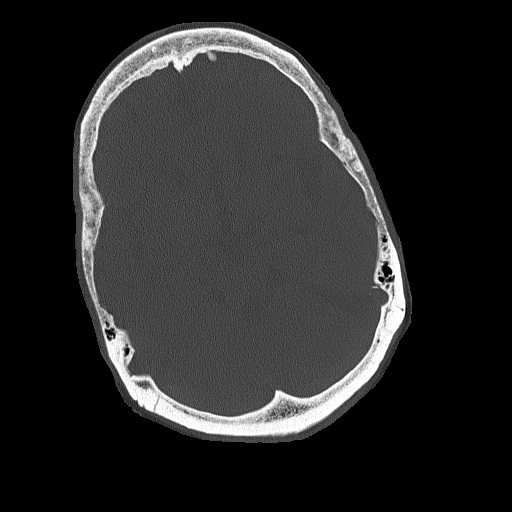
[im 37/83  bone]
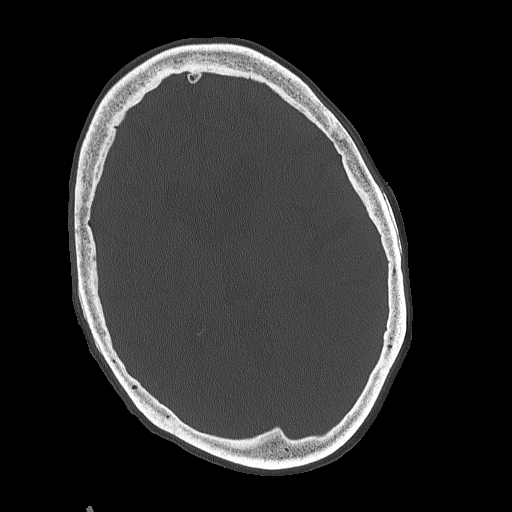

[Series 4: coronal soft tissue · coronal · 0.34mm/px · 3 of 73 slices shown]
[im 25/73  brain]
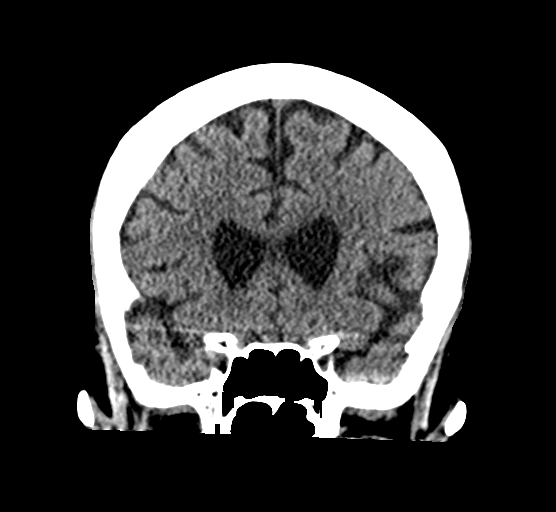
[im 33/73  brain]
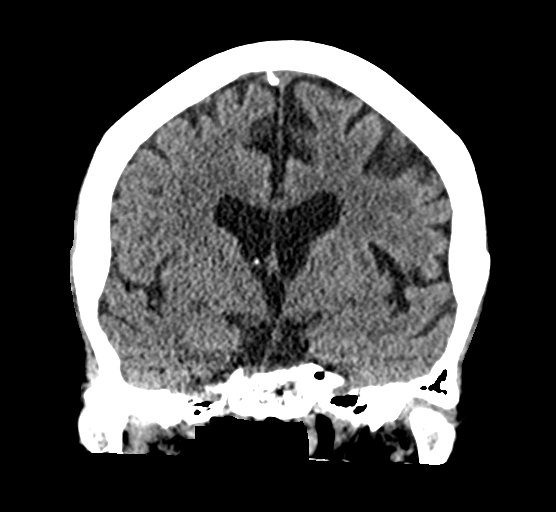
[im 41/73  brain]
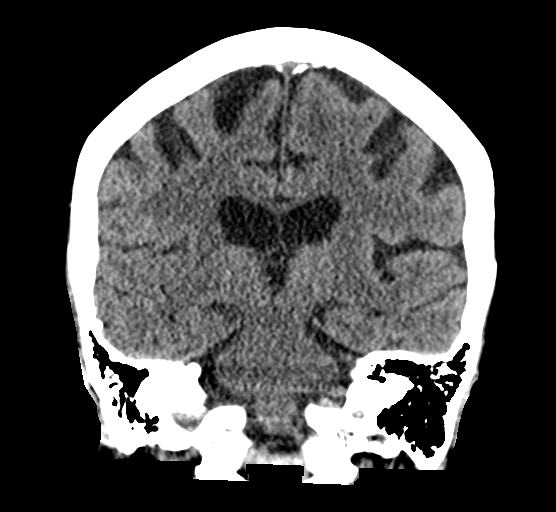

[Series 5: sagittal soft tissue · sagittal · 0.34mm/px · 3 of 64 slices shown]
[im 22/64  brain]
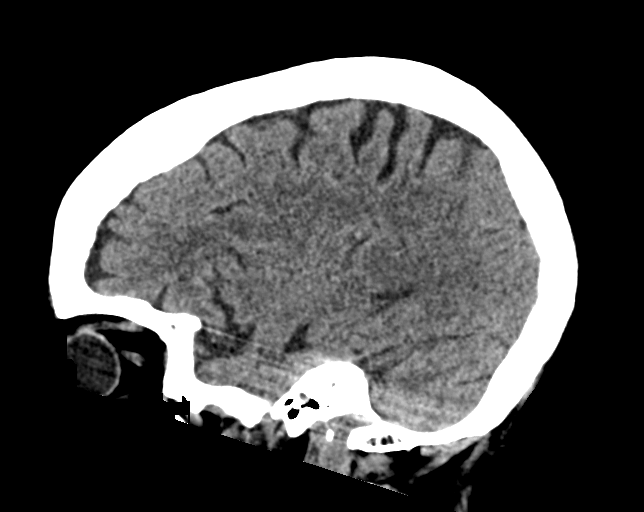
[im 32/64  brain]
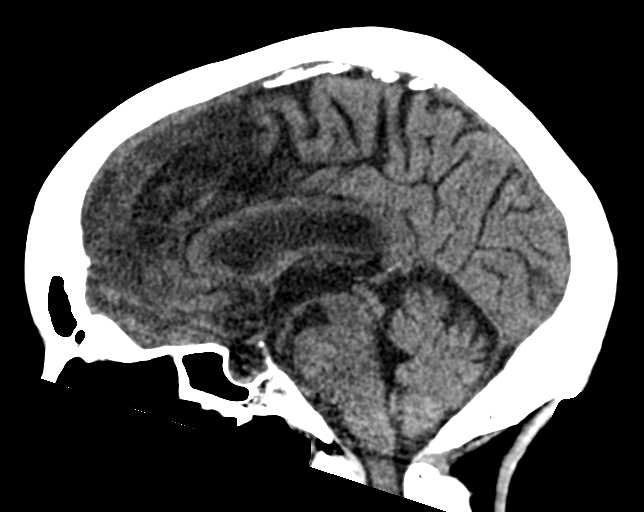
[im 43/64  brain]
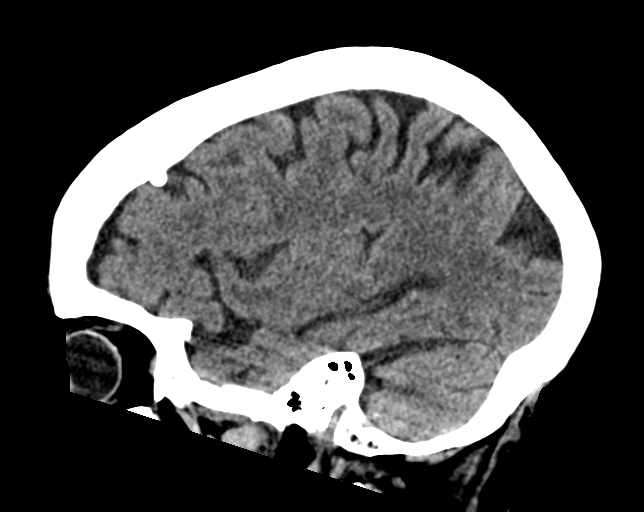

[17 of 47 positions shown; findings below may reference images not displayed]

FINDINGS: Brain: Mild age related volume loss. No acute intracranial
abnormality. Specifically, no hemorrhage, hydrocephalus, mass
lesion, acute infarction, or significant intracranial injury.

Vascular: No hyperdense vessel or unexpected calcification.

Skull: No acute calvarial abnormality.

Sinuses/Orbits: No acute findings

Other: None
IMPRESSION: No acute intracranial abnormality.

## 2022-08-19 DEATH — deceased

## 2022-09-19 DEATH — deceased
